# Patient Record
Sex: Female | Born: 1970 | Race: Black or African American | Hispanic: No | Marital: Married | Smoking: Current every day smoker
Health system: Southern US, Community
[De-identification: ages and names within clinical notes are randomized; demographics above are authoritative.]

## PROBLEM LIST (undated history)

## (undated) DIAGNOSIS — Z21 Asymptomatic human immunodeficiency virus [HIV] infection status: Secondary | ICD-10-CM

## (undated) DIAGNOSIS — I10 Essential (primary) hypertension: Secondary | ICD-10-CM

## (undated) DIAGNOSIS — R7989 Other specified abnormal findings of blood chemistry: Secondary | ICD-10-CM

## (undated) DIAGNOSIS — B20 Human immunodeficiency virus [HIV] disease: Secondary | ICD-10-CM

## (undated) DIAGNOSIS — K219 Gastro-esophageal reflux disease without esophagitis: Secondary | ICD-10-CM

## (undated) HISTORY — DX: Other specified abnormal findings of blood chemistry: R79.89

---

## 1998-09-09 ENCOUNTER — Emergency Department (HOSPITAL_COMMUNITY): Admission: EM | Admit: 1998-09-09 | Discharge: 1998-09-09 | Payer: Self-pay | Admitting: Emergency Medicine

## 1999-06-04 ENCOUNTER — Emergency Department (HOSPITAL_COMMUNITY): Admission: EM | Admit: 1999-06-04 | Discharge: 1999-06-04 | Payer: Self-pay | Admitting: Emergency Medicine

## 1999-12-27 ENCOUNTER — Emergency Department (HOSPITAL_COMMUNITY): Admission: EM | Admit: 1999-12-27 | Discharge: 1999-12-27 | Payer: Self-pay | Admitting: Emergency Medicine

## 2000-11-15 ENCOUNTER — Encounter: Payer: Self-pay | Admitting: Emergency Medicine

## 2000-11-15 ENCOUNTER — Emergency Department (HOSPITAL_COMMUNITY): Admission: EM | Admit: 2000-11-15 | Discharge: 2000-11-15 | Payer: Self-pay | Admitting: Emergency Medicine

## 2001-07-19 ENCOUNTER — Emergency Department (HOSPITAL_COMMUNITY): Admission: EM | Admit: 2001-07-19 | Discharge: 2001-07-19 | Payer: Self-pay

## 2001-07-20 ENCOUNTER — Ambulatory Visit (HOSPITAL_BASED_OUTPATIENT_CLINIC_OR_DEPARTMENT_OTHER): Admission: RE | Admit: 2001-07-20 | Discharge: 2001-07-20 | Payer: Self-pay | Admitting: Orthopedic Surgery

## 2005-04-17 ENCOUNTER — Ambulatory Visit: Payer: Self-pay | Admitting: Internal Medicine

## 2005-04-17 ENCOUNTER — Ambulatory Visit: Payer: Self-pay | Admitting: Infectious Diseases

## 2005-04-17 ENCOUNTER — Inpatient Hospital Stay (HOSPITAL_COMMUNITY): Admission: EM | Admit: 2005-04-17 | Discharge: 2005-04-25 | Payer: Self-pay | Admitting: Emergency Medicine

## 2005-04-18 ENCOUNTER — Encounter: Payer: Self-pay | Admitting: Cardiology

## 2005-04-18 ENCOUNTER — Ambulatory Visit: Payer: Self-pay | Admitting: Cardiology

## 2005-04-18 ENCOUNTER — Encounter (INDEPENDENT_AMBULATORY_CARE_PROVIDER_SITE_OTHER): Payer: Self-pay | Admitting: Specialist

## 2005-04-21 ENCOUNTER — Encounter (INDEPENDENT_AMBULATORY_CARE_PROVIDER_SITE_OTHER): Payer: Self-pay | Admitting: *Deleted

## 2005-04-26 ENCOUNTER — Emergency Department (HOSPITAL_COMMUNITY): Admission: EM | Admit: 2005-04-26 | Discharge: 2005-04-26 | Payer: Self-pay | Admitting: Emergency Medicine

## 2005-04-27 ENCOUNTER — Emergency Department (HOSPITAL_COMMUNITY): Admission: EM | Admit: 2005-04-27 | Discharge: 2005-04-27 | Payer: Self-pay | Admitting: Emergency Medicine

## 2005-04-28 ENCOUNTER — Encounter (HOSPITAL_COMMUNITY): Admission: RE | Admit: 2005-04-28 | Discharge: 2005-05-28 | Payer: Self-pay | Admitting: Internal Medicine

## 2005-05-03 ENCOUNTER — Emergency Department (HOSPITAL_COMMUNITY): Admission: EM | Admit: 2005-05-03 | Discharge: 2005-05-03 | Payer: Self-pay | Admitting: Emergency Medicine

## 2005-05-04 ENCOUNTER — Emergency Department (HOSPITAL_COMMUNITY): Admission: EM | Admit: 2005-05-04 | Discharge: 2005-05-04 | Payer: Self-pay | Admitting: Emergency Medicine

## 2005-05-08 ENCOUNTER — Ambulatory Visit: Payer: Self-pay | Admitting: Internal Medicine

## 2005-05-10 ENCOUNTER — Emergency Department (HOSPITAL_COMMUNITY): Admission: EM | Admit: 2005-05-10 | Discharge: 2005-05-10 | Payer: Self-pay | Admitting: Emergency Medicine

## 2005-05-11 ENCOUNTER — Emergency Department (HOSPITAL_COMMUNITY): Admission: EM | Admit: 2005-05-11 | Discharge: 2005-05-12 | Payer: Self-pay | Admitting: Emergency Medicine

## 2005-05-17 ENCOUNTER — Emergency Department (HOSPITAL_COMMUNITY): Admission: EM | Admit: 2005-05-17 | Discharge: 2005-05-17 | Payer: Self-pay | Admitting: Emergency Medicine

## 2005-05-18 ENCOUNTER — Emergency Department (HOSPITAL_COMMUNITY): Admission: EM | Admit: 2005-05-18 | Discharge: 2005-05-18 | Payer: Self-pay | Admitting: Emergency Medicine

## 2005-05-24 ENCOUNTER — Emergency Department (HOSPITAL_COMMUNITY): Admission: EM | Admit: 2005-05-24 | Discharge: 2005-05-24 | Payer: Self-pay | Admitting: Emergency Medicine

## 2005-05-29 ENCOUNTER — Emergency Department (HOSPITAL_COMMUNITY): Admission: EM | Admit: 2005-05-29 | Discharge: 2005-05-29 | Payer: Self-pay | Admitting: Emergency Medicine

## 2006-04-19 ENCOUNTER — Emergency Department (HOSPITAL_COMMUNITY): Admission: EM | Admit: 2006-04-19 | Discharge: 2006-04-19 | Payer: Self-pay | Admitting: Emergency Medicine

## 2006-07-08 ENCOUNTER — Emergency Department (HOSPITAL_COMMUNITY): Admission: EM | Admit: 2006-07-08 | Discharge: 2006-07-08 | Payer: Self-pay | Admitting: Emergency Medicine

## 2007-07-29 ENCOUNTER — Inpatient Hospital Stay (HOSPITAL_COMMUNITY): Admission: EM | Admit: 2007-07-29 | Discharge: 2007-08-03 | Payer: Self-pay | Admitting: Emergency Medicine

## 2007-08-02 ENCOUNTER — Ambulatory Visit: Payer: Self-pay | Admitting: Physical Medicine & Rehabilitation

## 2007-11-12 ENCOUNTER — Emergency Department (HOSPITAL_COMMUNITY): Admission: EM | Admit: 2007-11-12 | Discharge: 2007-11-12 | Payer: Self-pay | Admitting: Emergency Medicine

## 2008-12-11 ENCOUNTER — Emergency Department (HOSPITAL_COMMUNITY): Admission: EM | Admit: 2008-12-11 | Discharge: 2008-12-11 | Payer: Self-pay | Admitting: Emergency Medicine

## 2009-07-09 ENCOUNTER — Emergency Department (HOSPITAL_COMMUNITY): Admission: EM | Admit: 2009-07-09 | Discharge: 2009-07-09 | Payer: Self-pay | Admitting: Emergency Medicine

## 2009-07-18 ENCOUNTER — Emergency Department (HOSPITAL_COMMUNITY): Admission: EM | Admit: 2009-07-18 | Discharge: 2009-07-18 | Payer: Self-pay | Admitting: Emergency Medicine

## 2010-07-02 ENCOUNTER — Emergency Department (HOSPITAL_COMMUNITY): Admission: EM | Admit: 2010-07-02 | Discharge: 2010-07-02 | Payer: Self-pay | Admitting: Emergency Medicine

## 2011-01-24 LAB — POCT I-STAT, CHEM 8
BUN: 13 mg/dL (ref 6–23)
Creatinine, Ser: 1 mg/dL (ref 0.4–1.2)
Glucose, Bld: 97 mg/dL (ref 70–99)
Hemoglobin: 15 g/dL (ref 12.0–15.0)
Potassium: 4.1 mEq/L (ref 3.5–5.1)
TCO2: 25 mmol/L (ref 0–100)
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
## 2011-03-07 NOTE — Op Note (Signed)
[place] [place] [facility]  Patient:    Michelle Waters, Michelle Waters Visit Number: [id number] MRN: [id number]          Service Type: DSU Location: [facility] Attending Physician:  Ronne Binning Dictated by:   Nicki Reaper, M.D. Proc. Date: 07/20/01 Admit Date:  07/20/2001                             Operative Report  PREOPERATIVE DIAGNOSIS:  Laceration, left forearm.  POSTOPERATIVE DIAGNOSIS:  Laceration, left forearm.  OPERATION:  Repair of palmaris longus tendon, flexor carpi ulnaris muscle, superficialis muscle, left arm.  SURGEON:  Nicki Reaper, M.D.  ASSISTANT:  Joaquin Courts, R.N.  ANESTHESIA:  General.  ANESTHESIA:  Janetta Hora. Gelene Mink, M.D.  HISTORY:  The patient is a 40 year old female who suffered a laceration over the midportion of her left forearm.  She complains of pain with flexion of her wrist, numbness and tingling.  DESCRIPTION OF PROCEDURE:  The patient was brought to the operating room where a general anesthetic was carried out without difficulty.  She was prepped and draped using Betadine scrubbing solution with the left arm free.  The limb was exsanguinated with an Esmarch bandage.  Tourniquet placed high on the arm was inflated to 250 mmHg.  The wound was opened.  The laceration to the palmaris longus was immediately apparent.  Laceration into the flexor carpi ulnaris and superficialis was identified.  The depths of the wound were explored.  These were found only to go into the muscle bellies without tendinous involvement. The muscle bellies were repaired with figure-of-eight 4-0 Vicryl sutures.  The tendon was repaired with figure-of-eight 4-0 Mersilene sutures.  The fascia was closed with a running 4-0 Vicryl.  Subcutaneous tissue was closed after irrigation with 4-0 Vicryl and the skin with a subcuticular 4-0 Monocryl suture.  Steri-Strips were applied and sterile compressive dressing and splint were applied.  Patient tolerated the  procedure well and was taken to the recovery room for observation in satisfactory condition.    She is discharged home to return to the [facility] in one week on Vicodin.  She was given vancomycin in the operating room. Dictated by:   Nicki Reaper, M.D. Attending Physician:  Ronne Binning DD:  07/20/01 TD:  07/20/01 Job: [id number]

## 2011-03-07 NOTE — Discharge Summary (Signed)
NAMEJACOLE, Michelle Waters NO.:  [id number]   MEDICAL RECORD NO.:  [id number]          PATIENT TYPE:  INP   LOCATION:  5522                         FACILITY:  [facility]   PHYSICIAN:  Madaline Guthrie, M.D.    DATE OF BIRTH:  [birth year]   DATE OF ADMISSION:  04/17/2005  DATE OF DISCHARGE:  04/25/2005                                 DISCHARGE SUMMARY   RESIDENT:  Chapman Fitch, M.D.   DISCHARGE DIAGNOSES:  1.  Osteomyelitis of Right clavicle.  2.  Latent syphilis, diagnosed by a positive RPR and positive TPPA.  3.  Normocytic anemia.  4.  History of poly-substance abuse.   DISCHARGE MEDICATIONS:  1.  Ceftriaxone 2 g IV daily x4 weeks.  2.  Tylenol 325 mg q.4-6h. p.r.n. pain.   DISPOSITION/FOLLOWUP:  1.  Michelle Waters was discharged home from the hospital following evaluation and      treatment of osteomyelitis of her right medial clavicle.  She was      discharged with a percutaneous indwelling central catheter line and      scheduled to return to the hospital, outpatient clinic or the emergency      room daily for IV antibiotic administration for four weeks.  2.  She will follow up in the outpatient clinic with Dr. Chapman Fitch on      May 08, 2005, at 2:30 p.m.   DIET:  No restrictions.   ACTIVITY:  No restrictions.   PROCEDURE:  1.  On April 17, 2005, a CT of the clavicle showed destructive lesion of the      inferior clavicle.  2.  On April 18, 2005, an echocardiogram showed no abnormalities with an      ejection fraction of 65%.  3.  On April 21, 2005, a CT-guided fine needle aspiration of the right      clavicle showed an acute inflammation and no malignant cells.  4.  On April 25, 2005, a percutaneous indwelling central catheter line was      placed for outpatient antibiotic administration.   CONSULTATIONS:  1.  Orthopedics.  2.  Infectious disease with Dr. Fransisco Hertz.   HISTORY OF PRESENT ILLNESS:  Michelle Waters is a 40 year old female with a  history of  poly-substance abuse, who presents to the emergency room  complaining of increasing pain on her right clavicle for the previous two  weeks.  She reported no history of trauma or IV drug use.  Her physical  examination was remarkable for the right clavicle which was warm, swollen,  erythematous and tender to palpation medially.  Please refer to the H&P in  the chart for the full admitting details.   ADMISSION LABORATORY DATA:  CBC showed a WBC of 7.8, hemoglobin 11.8,  hematocrit 35.3, platelets 361 with an ANC of 6.3 and MCV of 84.5.  BMET:  Sodium 134, potassium 3.4, chloride 102, bicarbonate 23, BUN 5, creatinine  0.8, glucose 159.  Urine drug screen was positive for benzodiazepine,  cocaine and opiate.   HOSPITAL COURSE:  #1 - RIGHT CLAVICULAR OSTEOARTHRITIS:  Michelle Waters was  admitted to evaluate her right clavicle lesion due to infection, versus  malignancy, versus trauma, versus gout or rheumatoid arthritis.  The  location and symptoms were not consistent with gout or rheumatoid arthritis,  and this was quickly ruled out.  She reported no history of trauma.  Her  admission WBC was unremarkable at 7.8.  We drew blood cultures and then  began empiric antibiotic therapy with vancomycin and ciprofloxacin.  Orthopedics was consulted and performed a bone biopsy which was found to  contain no malignant cells, and to have acute inflammation which could be  consistent with osteomyelitis.  AFB, yeast, aerobic, anaerobic and fungal  stains and cultures have been negative to date.  AFB,  yeast and fungal  cultures remain pending at the time of this dictation.  Because Michelle Waters  had a reactive RPR and positive TPPA, a treponemal stain was added to the  biopsy, but was also pending at the time of this dictation.  Michelle Waters  reports a PENICILLIN allergy with urticarial reaction, thus we consulted the  infectious disease team for recommendations regarding treatment  understanding the organism,  negative biopsy and blood-positive RPR/TPPA.  They recommended ceftriaxone IV for broad coverage for multiple causes of  osteomyelitis including treponema.  Michelle Waters was to go home with a  percutaneous indwelling central catheter line for IV ceftriaxone.  She was  arranged to come to the outpatient clinic during the week-days or to the  emergency room on the weekends to receive her medications.  They will also  perform the percutaneous indwelling central catheter line care.  The  infectious disease team also recommended a PPD to evaluate for another  possible source of osteomyelitis.  This was placed on April 24, 2005, and will  be read on April 26, 2005, when Michelle Waters comes for her IV infusion of  antibiotics.  Michelle Waters remained afebrile with a normal white blood cell  count throughout this admission.   #2 - CHLAMYDIA:  During the workup for causes of infectious osteomyelitis,  Michelle Waters was found to be Chlamydia-positive and GC negative per a vaginal  swab.  She was subsequently treated with one of dose erythromycin, 1 gm, and  one dose of ceftriaxone 125 mg IV.   #3 - LATENT SYPHILIS:  During the workup for causes of infection  osteomyelitis, Michelle Waters was found to be RPR reactive with a positive TPPA.  She is allergic to PENICILLIN.  Ceftriaxone was recommended for the  osteomyelitis and was chosen because of broad coverage which extended to the  treponema.   #4 - NORMOCYTIC ANEMIA:  Routine labs revealed a slight normocytic anemia of  10.2, which was evaluated because of a questionable history of sickle cell  disease.  Iron studies and electrophoresis were normal.  This should be  followed up with her primary care physician.   DISCHARGE LABORATORY DATA:  At discharge Michelle Waters's labs were as follows:  CBC with white blood cell count of 10.3, hemoglobin 10.2, hematocrit 31, platelets 352.  BMET showed a sodium of  137, potassium 3.8, chloride 105,  bicarbonate 26, BUN 8, creatinine  0.8, glucose 93.      Chapman Fitch, MD      Madaline Guthrie, M.D.  Electronically Signed    IO/MEDQ  D:  04/30/2005  T:  04/30/2005  Job:  [id number]

## 2011-07-11 LAB — URINALYSIS, ROUTINE W REFLEX MICROSCOPIC
Hgb urine dipstick: NEGATIVE
Ketones, ur: 40 — AB
Protein, ur: NEGATIVE
Specific Gravity, Urine: 1.025
pH: 6

## 2011-07-11 LAB — RAPID URINE DRUG SCREEN, HOSP PERFORMED
Amphetamines: NOT DETECTED
Barbiturates: NOT DETECTED
Cocaine: POSITIVE — AB
Opiates: NOT DETECTED
Tetrahydrocannabinol: NOT DETECTED

## 2011-07-11 LAB — POCT I-STAT CREATININE
Creatinine, Ser: 0.9
Operator id: 288831

## 2011-07-11 LAB — I-STAT 8, (EC8 V) (CONVERTED LAB)
Acid-Base Excess: 1
Bicarbonate: 27.2 — ABNORMAL HIGH
Glucose, Bld: 101 — ABNORMAL HIGH
TCO2: 29
pCO2, Ven: 46.3
pH, Ven: 7.376 — ABNORMAL HIGH

## 2011-07-11 LAB — DIFFERENTIAL
Eosinophils Relative: 0
Lymphocytes Relative: 15
Lymphs Abs: 1.4
Monocytes Absolute: 0.3
Neutro Abs: 7.5
Neutrophils Relative %: 81 — ABNORMAL HIGH

## 2011-07-11 LAB — CBC
HCT: 39.7
RDW: 14.1

## 2011-07-11 LAB — POCT PREGNANCY, URINE: Preg Test, Ur: NEGATIVE

## 2011-07-11 LAB — URINE MICROSCOPIC-ADD ON

## 2011-07-11 LAB — ETHANOL: Alcohol, Ethyl (B): <5

## 2011-07-31 LAB — BASIC METABOLIC PANEL
CO2: 27
CO2: 28
Calcium: 9.2
GFR calc Af Amer: >60
GFR calc non Af Amer: >60
Glucose, Bld: 103 — ABNORMAL HIGH
Potassium: 3.6
Potassium: 4.3
Sodium: 137
Sodium: 138

## 2011-07-31 LAB — I-STAT 8, (EC8 V) (CONVERTED LAB)
Acid-Base Excess: 2
BUN: 5 — ABNORMAL LOW
Chloride: 103
Glucose, Bld: 145 — ABNORMAL HIGH
Potassium: 3.9
pCO2, Ven: 49
pH, Ven: 7.369 — ABNORMAL HIGH

## 2011-07-31 LAB — POCT I-STAT CREATININE
Creatinine, Ser: 0.9
Operator id: 198171

## 2011-07-31 LAB — CBC
HCT: 33.9 — ABNORMAL LOW
Hemoglobin: 11.3 — ABNORMAL LOW
MCHC: 33.3
RBC: 3.76 — ABNORMAL LOW

## 2011-07-31 LAB — HEPATITIS PANEL, ACUTE: Hep A IgM: NEGATIVE

## 2011-07-31 LAB — HEMOGLOBIN A1C: Mean Plasma Glucose: 119

## 2013-02-21 ENCOUNTER — Emergency Department (HOSPITAL_COMMUNITY)
Admission: EM | Admit: 2013-02-21 | Discharge: 2013-02-21 | Disposition: A | Discharge status: Home / Self Care | Payer: PRIVATE HEALTH INSURANCE | Attending: Emergency Medicine | Admitting: Emergency Medicine

## 2013-02-21 ENCOUNTER — Encounter (HOSPITAL_COMMUNITY): Payer: Self-pay | Admitting: Cardiology

## 2013-02-21 DIAGNOSIS — F17200 Nicotine dependence, unspecified, uncomplicated: Secondary | ICD-10-CM | POA: Insufficient documentation

## 2013-02-21 DIAGNOSIS — K089 Disorder of teeth and supporting structures, unspecified: Secondary | ICD-10-CM | POA: Insufficient documentation

## 2013-02-21 DIAGNOSIS — K0889 Other specified disorders of teeth and supporting structures: Secondary | ICD-10-CM

## 2013-02-21 MED ORDER — CLINDAMYCIN HCL 150 MG PO CAPS: 150.0000 mg | ORAL_CAPSULE | Freq: Four times a day (QID) | ORAL | Status: DC

## 2013-02-21 MED ORDER — HYDROCODONE-ACETAMINOPHEN 5-325 MG PO TABS
1.0000 | ORAL_TABLET | Freq: Once | ORAL | Status: AC
  Administered 2013-02-21: 1 via ORAL
  Filled 2013-02-21: qty 1

## 2013-02-21 MED ORDER — CLINDAMYCIN HCL 150 MG PO CAPS
300.0000 mg | ORAL_CAPSULE | Freq: Once | ORAL | Status: AC
  Administered 2013-02-21: 300 mg via ORAL
  Filled 2013-02-21: qty 2

## 2013-02-21 MED ORDER — HYDROCODONE-ACETAMINOPHEN 5-325 MG PO TABS: 2.0000 | ORAL_TABLET | ORAL | Status: DC | PRN

## 2013-02-21 NOTE — ED Notes (Signed)
Pt reports dental pain over the past 2 weeks. States she has been taking OTC medication without relief.

## 2013-02-21 NOTE — ED Provider Notes (Signed)
History     CSN: [id number]  Arrival date & time 02/21/13  1113   First MD Initiated Contact with Patient 02/21/13 1253      Chief Complaint  Patient presents with  . Dental Pain    (Consider location/radiation/quality/duration/timing/severity/associated sxs/prior treatment) HPI  42 year old female presents complaining of dental pain. Patient reports for the past 2 weeks she has been having intermittent pain to the right upper jaw. Pain is described as a sharp and throbbing sensation, radiates up towards the eye and her right side of face. Pain is worsened with chewing, cold hot water, or cold air. She has been taking over-the-counter medication including Tylenol, Motrin, and the with minimal relief. She denies any fever, chills, neck pain, or rash. No recent trauma. She has not follow up with dentist. She is a smoker.  History reviewed. No pertinent past medical history.  History reviewed. No pertinent past surgical history.  History reviewed. No pertinent family history.  History  Substance Use Topics  . Smoking status: Current Every Day Smoker  . Smokeless tobacco: Not on file  . Alcohol Use: Yes    OB History   Grav Para Term Preterm Abortions TAB SAB Ect Mult Living                  Review of Systems  Constitutional: Negative for fever.  HENT: Positive for dental problem. Negative for ear pain, sore throat and trouble swallowing.   Skin: Negative for rash.  Neurological: Positive for headaches.    Allergies  Ampicillin and Penicillins  Home Medications   Current Outpatient Rx  Name  Route  Sig  Dispense  Refill  . ibuprofen (ADVIL,MOTRIN) 200 MG tablet   Oral   Take 200-400 mg by mouth every 6 (six) hours as needed for pain or headache.           BP 139/90  Pulse 80  Temp(Src) 98.9 F (37.2 C) (Oral)  Resp 18  SpO2 100%  Physical Exam  Nursing note and vitals reviewed. Constitutional: She appears well-developed and well-nourished. She appears  distressed (uncomfortable appearing, holding the right side of her cheek.).  HENT:  Head: Normocephalic and atraumatic.  Mouth: Poor dentition. Tenderness to right second upper premolar, with dental decay. Tenderness to surrounding gumline without obvious abscess amenable to drainage. No trismus.  Eyes: Conjunctivae are normal.  Neck: Neck supple.  Lymphadenopathy:    She has no cervical adenopathy.  Neurological: She is alert.  Skin: Skin is warm. No rash noted.    ED Course  Procedures (including critical care time)  1:42 PM Patient presents with dental pain. Pain medication and antibiotic given.  dentist referral given.  Labs Reviewed - No data to display No results found.   1. Pain, dental       MDM  BP 139/90  Pulse 80  Temp(Src) 98.9 F (37.2 C) (Oral)  Resp 18  SpO2 100%         Fayrene Helper, PA-C 02/21/13 1344

## 2013-02-21 NOTE — ED Provider Notes (Signed)
Medical screening examination/treatment/procedure(s) were performed by non-physician practitioner and as supervising physician I was immediately available for consultation/collaboration. Ervine Witucki, MD, FACEP   Monasia Lair L Jamesa Tedrick, MD 02/21/13 1635 

## 2013-11-05 ENCOUNTER — Emergency Department (HOSPITAL_COMMUNITY)
Admission: EM | Admit: 2013-11-05 | Discharge: 2013-11-05 | Disposition: A | Discharge status: Home / Self Care | Payer: PRIVATE HEALTH INSURANCE | Attending: Emergency Medicine | Admitting: Emergency Medicine

## 2013-11-05 ENCOUNTER — Encounter (HOSPITAL_COMMUNITY): Payer: Self-pay | Admitting: Emergency Medicine

## 2013-11-05 DIAGNOSIS — J029 Acute pharyngitis, unspecified: Secondary | ICD-10-CM | POA: Insufficient documentation

## 2013-11-05 DIAGNOSIS — H9209 Otalgia, unspecified ear: Secondary | ICD-10-CM | POA: Insufficient documentation

## 2013-11-05 DIAGNOSIS — F17200 Nicotine dependence, unspecified, uncomplicated: Secondary | ICD-10-CM | POA: Insufficient documentation

## 2013-11-05 DIAGNOSIS — Z880 Allergy status to penicillin: Secondary | ICD-10-CM | POA: Insufficient documentation

## 2013-11-05 DIAGNOSIS — Z792 Long term (current) use of antibiotics: Secondary | ICD-10-CM | POA: Insufficient documentation

## 2013-11-05 LAB — RAPID STREP SCREEN (MED CTR MEBANE ONLY): STREPTOCOCCUS, GROUP A SCREEN (DIRECT): NEGATIVE

## 2013-11-05 MED ORDER — DEXAMETHASONE SODIUM PHOSPHATE 10 MG/ML IJ SOLN
10.0000 mg | Freq: Once | INTRAMUSCULAR | Status: AC
  Administered 2013-11-05: 10 mg via INTRAMUSCULAR
  Filled 2013-11-05: qty 1

## 2013-11-05 MED ORDER — CARBAMIDE PEROXIDE 6.5 % OT SOLN: 5.0000 [drp] | Freq: Two times a day (BID) | OTIC | Status: DC

## 2013-11-05 MED ORDER — PSEUDOEPHEDRINE HCL 30 MG PO TABS: 30.0000 mg | ORAL_TABLET | ORAL | Status: DC | PRN

## 2013-11-05 NOTE — ED Provider Notes (Signed)
Medical screening examination/treatment/procedure(s) were performed by non-physician practitioner and as supervising physician I was immediately available for consultation/collaboration.  EKG Interpretation   None       Rolland Porter, MD, Abram Sander   Janice Norrie, MD 11/05/13 1330

## 2013-11-05 NOTE — ED Provider Notes (Signed)
CSN: [id number]     Arrival date & time 11/05/13  1129 History  This chart was scribed for non-physician practitioner working with Janice Norrie, MD by Stacy Gardner, ED scribe. This patient was seen in room TR08C/TR08C and the patient's care was started at 11:45 AM.   None    Chief Complaint  Patient presents with  . Otalgia  . Sore Throat   (Consider location/radiation/quality/duration/timing/severity/associated sxs/prior Treatment) Patient is a 43 y.o. female presenting with pharyngitis. The history is provided by the patient and medical records. No language interpreter was used.  Sore Throat   HPI Comments: Michelle Waters is a 43 y.o. female who presents to the Emergency Department complaining of reoccuring R otalgia and sore throat that returned two days ago.  Pt has the associated symptoms of right sinus pain/pressure, tonsil irritation, chills, fever, cough (productive), congestion, and sore throat. She was told she needs a tonsillectomy. Pt states finding mild relief with eating ice cream. She currently smokes tobacco everyday. Pt has allergies to ampicillins and penicillins. She does not have any prior medical complications. History reviewed. No pertinent past medical history. History reviewed. No pertinent past surgical history. No family history on file. History  Substance Use Topics  . Smoking status: Current Every Day Smoker  . Smokeless tobacco: Not on file  . Alcohol Use: Yes   OB History   Grav Para Term Preterm Abortions TAB SAB Ect Mult Living                 Review of Systems  Constitutional: Positive for fever and chills.  HENT: Positive for congestion, ear pain, sinus pressure and sore throat.   Respiratory: Positive for cough.   All other systems reviewed and are negative.    Allergies  Ampicillin and Penicillins  Home Medications   Current Outpatient Rx  Name  Route  Sig  Dispense  Refill  . clindamycin (CLEOCIN) 150 MG capsule   Oral   Take 1  capsule (150 mg total) by mouth every 6 (six) hours.   28 capsule   0   . HYDROcodone-acetaminophen (NORCO/VICODIN) 5-325 MG per tablet   Oral   Take 2 tablets by mouth every 4 (four) hours as needed for pain.   10 tablet   0   . ibuprofen (ADVIL,MOTRIN) 200 MG tablet   Oral   Take 200-400 mg by mouth every 6 (six) hours as needed for pain or headache.          BP 146/77  Pulse 67  Temp(Src) 98.3 F (36.8 C) (Oral)  Resp 18  Wt 125 lb 14.4 oz (57.108 kg)  SpO2 100% Physical Exam  Nursing note and vitals reviewed. Constitutional: She is oriented to person, place, and time. She appears well-developed and well-nourished. No distress.  HENT:  Head: Normocephalic and atraumatic.  Left Ear: External ear normal.  Nose: Nose normal.  Mouth/Throat: Posterior oropharyngeal erythema present.  No sinus tenderness to palpation - right ear with cerumen impaction, mild posterior pharyngeal erythema  Eyes: Conjunctivae are normal. Pupils are equal, round, and reactive to light. No scleral icterus.  Neck: Neck supple.  Cardiovascular: Normal rate, regular rhythm, normal heart sounds and intact distal pulses.   No murmur heard. Pulmonary/Chest: Effort normal and breath sounds normal. No stridor. No respiratory distress. She has no wheezes. She has no rales.  Abdominal: Soft. Bowel sounds are normal. She exhibits no distension. There is no tenderness.  Musculoskeletal: Normal range of motion.  Neurological:  She is alert and oriented to person, place, and time.  Skin: Skin is warm and dry. No rash noted.  Psychiatric: She has a normal mood and affect. Her behavior is normal.    ED Course  Procedures (including critical care time) DIAGNOSTIC STUDIES: Oxygen Saturation is 100% on room air, normal by my interpretation.    COORDINATION OF CARE:  11:51 AM Discussed course of care with pt which includes rapid strep test. Pt understands and agrees.   Labs Review Labs Reviewed - No data to  display Imaging Review No results found.  EKG Interpretation   None      Results for orders placed during the hospital encounter of 11/05/13  RAPID STREP SCREEN      Result Value Range   Streptococcus, Group A Screen (Direct) NEGATIVE  NEGATIVE   No results found.  Medications  dexamethasone (DECADRON) injection 10 mg (10 mg Intramuscular Given 11/05/13 1159)     MDM  Viral pharyngitis  Patient here with right ear pain (cerumen impaction) and right sinus pain and pressure - do not feel antibiotics are needed at this time.  No evidence of soft tissue infection, ludwigs angina.  I personally performed the services described in this documentation, which was scribed in my presence. The recorded information has been reviewed and is accurate.      Idalia Needle Joelyn Oms, PA-C 11/05/13 1316

## 2013-11-05 NOTE — Discharge Instructions (Signed)
Antibiotic Nonuse  Your caregiver felt that the infection or problem was not one that would be helped with an antibiotic. Infections may be caused by viruses or bacteria. Only a caregiver can tell which one of these is the likely cause of an illness. A cold is the most common cause of infection in both adults and children. A cold is a virus. Antibiotic treatment will have no effect on a viral infection. Viruses can lead to many lost days of work caring for sick children and many missed days of school. Children may catch as many as 10 "colds" or "flus" per year during which they can be tearful, cranky, and uncomfortable. The goal of treating a virus is aimed at keeping the ill person comfortable. Antibiotics are medications used to help the body fight bacterial infections. There are relatively few types of bacteria that cause infections but there are hundreds of viruses. While both viruses and bacteria cause infection they are very different types of germs. A viral infection will typically go away by itself within 7 to 10 days. Bacterial infections may spread or get worse without antibiotic treatment. Examples of bacterial infections are:  Sore throats (like strep throat or tonsillitis).  Infection in the lung (pneumonia).  Ear and skin infections. Examples of viral infections are:  Colds or flus.  Most coughs and bronchitis.  Sore throats not caused by Strep.  Runny noses. It is often best not to take an antibiotic when a viral infection is the cause of the problem. Antibiotics can kill off the helpful bacteria that we have inside our body and allow harmful bacteria to start growing. Antibiotics can cause side effects such as allergies, nausea, and diarrhea without helping to improve the symptoms of the viral infection. Additionally, repeated uses of antibiotics can cause bacteria inside of our body to become resistant. That resistance can be passed onto harmful bacterial. The next time you have  an infection it may be harder to treat if antibiotics are used when they are not needed. Not treating with antibiotics allows our own immune system to develop and take care of infections more efficiently. Also, antibiotics will work better for [place] when they are prescribed for bacterial infections. Treatments for a child that is ill may include:  Give extra fluids throughout the day to stay hydrated.  Get plenty of rest.  Only give your child over-the-counter or prescription medicines for pain, discomfort, or fever as directed by your caregiver.  The use of a cool mist humidifier may help stuffy noses.  Cold medications if suggested by your caregiver. Your caregiver may decide to start you on an antibiotic if:  The problem you were seen for today continues for a longer length of time than expected.  You develop a secondary bacterial infection. SEEK MEDICAL CARE IF:  Fever lasts longer than 5 days.  Symptoms continue to get worse after 5 to 7 days or become severe.  Difficulty in breathing develops.  Signs of dehydration develop (poor drinking, rare urinating, dark colored urine).  Changes in behavior or worsening tiredness (listlessness or lethargy). Document Released: 12/15/2001 Document Revised: 12/29/2011 Document Reviewed: 06/13/2009 [facility] Patient Information 2014 [place], [place].  Pharyngitis Pharyngitis is redness, pain, and swelling (inflammation) of your pharynx.  CAUSES  Pharyngitis is usually caused by infection. Most of the time, these infections are from viruses (viral) and are part of a cold. However, sometimes pharyngitis is caused by bacteria (bacterial). Pharyngitis can also be caused by allergies. Viral pharyngitis may be  spread from person to person by coughing, sneezing, and personal items or utensils (cups, forks, spoons, toothbrushes). Bacterial pharyngitis may be spread from person to person by more intimate contact, such as kissing.  SIGNS AND SYMPTOMS    Symptoms of pharyngitis include:   Sore throat.   Tiredness (fatigue).   Low-grade fever.   Headache.  Joint pain and muscle aches.  Skin rashes.  Swollen lymph nodes.  Plaque-like film on throat or tonsils (often seen with bacterial pharyngitis). DIAGNOSIS  Your health care provider will ask you questions about your illness and your symptoms. Your medical history, along with a physical exam, is often all that is needed to diagnose pharyngitis. Sometimes, a rapid strep test is done. Other lab tests may also be done, depending on the suspected cause.  TREATMENT  Viral pharyngitis will usually get better in 3 4 days without the use of medicine. Bacterial pharyngitis is treated with medicines that kill germs (antibiotics).  HOME CARE INSTRUCTIONS   Drink enough water and fluids to keep your urine clear or pale yellow.   Only take over-the-counter or prescription medicines as directed by your health care provider:   If you are prescribed antibiotics, make sure you finish them even if you start to feel better.   Do not take aspirin.   Get lots of rest.   Gargle with 8 oz of salt water ( tsp of salt per 1 qt of water) as often as every 1 2 hours to soothe your throat.   Throat lozenges (if you are not at risk for choking) or sprays may be used to soothe your throat. SEEK MEDICAL CARE IF:   You have large, tender lumps in your neck.  You have a rash.  You cough up green, yellow-brown, or bloody spit. SEEK IMMEDIATE MEDICAL CARE IF:   Your neck becomes stiff.  You drool or are unable to swallow liquids.  You vomit or are unable to keep medicines or liquids down.  You have severe pain that does not go away with the use of recommended medicines.  You have trouble breathing (not caused by a stuffy nose). MAKE SURE YOU:   Understand these instructions.  Will watch your condition.  Will get help right away if you are not doing well or get worse. Document  Released: 10/06/2005 Document Revised: 07/27/2013 Document Reviewed: 06/13/2013 [facility] Patient Information 2014 [place].  Salt Water Gargle This solution will help make your mouth and throat feel better. HOME CARE INSTRUCTIONS   Mix 1 teaspoon of salt in 8 ounces of warm water.  Gargle with this solution as much or often as you need or as directed. Swish and gargle gently if you have any sores or wounds in your mouth.  Do not swallow this mixture. Document Released: 07/10/2004 Document Revised: 12/29/2011 Document Reviewed: 12/01/2008 [facility] Patient Information 2014 [place].

## 2013-11-05 NOTE — ED Notes (Signed)
Pt states that on Thursday she began having R ear pain, R sinus pain/pressure, and sore throat.

## 2013-11-08 LAB — CULTURE, GROUP A STREP

## 2014-09-04 ENCOUNTER — Encounter (HOSPITAL_COMMUNITY): Payer: Self-pay | Admitting: Emergency Medicine

## 2014-09-04 ENCOUNTER — Emergency Department (HOSPITAL_COMMUNITY): Payer: PRIVATE HEALTH INSURANCE

## 2014-09-04 ENCOUNTER — Emergency Department (HOSPITAL_COMMUNITY)
Admission: EM | Admit: 2014-09-04 | Discharge: 2014-09-04 | Disposition: A | Discharge status: Home / Self Care | Payer: PRIVATE HEALTH INSURANCE | Attending: Emergency Medicine | Admitting: Emergency Medicine

## 2014-09-04 DIAGNOSIS — K047 Periapical abscess without sinus: Secondary | ICD-10-CM | POA: Diagnosis not present

## 2014-09-04 DIAGNOSIS — K029 Dental caries, unspecified: Secondary | ICD-10-CM | POA: Diagnosis not present

## 2014-09-04 DIAGNOSIS — K088 Other specified disorders of teeth and supporting structures: Secondary | ICD-10-CM | POA: Diagnosis present

## 2014-09-04 DIAGNOSIS — Z79899 Other long term (current) drug therapy: Secondary | ICD-10-CM | POA: Insufficient documentation

## 2014-09-04 DIAGNOSIS — Z720 Tobacco use: Secondary | ICD-10-CM | POA: Diagnosis not present

## 2014-09-04 DIAGNOSIS — Z880 Allergy status to penicillin: Secondary | ICD-10-CM | POA: Insufficient documentation

## 2014-09-04 HISTORY — DX: Gastro-esophageal reflux disease without esophagitis: K21.9

## 2014-09-04 LAB — I-STAT CHEM 8, ED
BUN: 8 mg/dL (ref 6–23)
CALCIUM ION: 1.25 mmol/L — AB (ref 1.12–1.23)
CHLORIDE: 106 meq/L (ref 96–112)
CREATININE: 1 mg/dL (ref 0.50–1.10)
GLUCOSE: 92 mg/dL (ref 70–99)
HCT: 43 % (ref 36.0–46.0)
Hemoglobin: 14.6 g/dL (ref 12.0–15.0)
Potassium: 4.2 mEq/L (ref 3.7–5.3)
Sodium: 140 mEq/L (ref 137–147)
TCO2: 22 mmol/L (ref 0–100)

## 2014-09-04 LAB — CBC
HEMATOCRIT: 38.8 % (ref 36.0–46.0)
Hemoglobin: 12.7 g/dL (ref 12.0–15.0)
MCH: 28.9 pg (ref 26.0–34.0)
MCHC: 32.7 g/dL (ref 30.0–36.0)
MCV: 88.2 fL (ref 78.0–100.0)
PLATELETS: 220 10*3/uL (ref 150–400)
RBC: 4.4 MIL/uL (ref 3.87–5.11)
RDW: 13 % (ref 11.5–15.5)
WBC: 8.9 10*3/uL (ref 4.0–10.5)

## 2014-09-04 MED ORDER — CLINDAMYCIN HCL 150 MG PO CAPS
450.0000 mg | ORAL_CAPSULE | Freq: Once | ORAL | Status: AC
  Administered 2014-09-04: 450 mg via ORAL
  Filled 2014-09-04: qty 3

## 2014-09-04 MED ORDER — CLINDAMYCIN HCL 150 MG PO CAPS: 450.0000 mg | ORAL_CAPSULE | Freq: Three times a day (TID) | ORAL | Status: DC

## 2014-09-04 MED ORDER — OXYCODONE-ACETAMINOPHEN 5-325 MG PO TABS
2.0000 | ORAL_TABLET | Freq: Once | ORAL | Status: AC
  Administered 2014-09-04: 2 via ORAL
  Filled 2014-09-04: qty 2

## 2014-09-04 MED ORDER — ONDANSETRON 4 MG PO TBDP
4.0000 mg | ORAL_TABLET | Freq: Once | ORAL | Status: AC
  Administered 2014-09-04: 4 mg via ORAL
  Filled 2014-09-04: qty 1

## 2014-09-04 MED ORDER — OXYCODONE-ACETAMINOPHEN 10-325 MG PO TABS: 1.0000 | ORAL_TABLET | ORAL | Status: DC | PRN

## 2014-09-04 MED ORDER — IOHEXOL 300 MG/ML  SOLN
75.0000 mL | Freq: Once | INTRAMUSCULAR | Status: AC | PRN
  Administered 2014-09-04: 75 mL via INTRAVENOUS

## 2014-09-04 NOTE — ED Notes (Signed)
Patient states she has been taking OTC sinus medication for 3-4 days.   Patient states that she has also been taking ibuprofen and tylenol for pain at home.

## 2014-09-04 NOTE — Discharge Instructions (Signed)
1. Medications: Percocet, clindamycin, usual home medications 2. Treatment: rest, drink plenty of fluids, take medications as prescribed 3. Follow Up: Please followup with dentistry within 1 week for discussion of your diagnoses and further evaluation after today's visit; if you do not have a primary care doctor use the resource guide provided to find one; Return to the ER for high fevers, difficulty breathing, difficulty swallowing or other concerning symptoms    Dental Abscess A dental abscess is a collection of infected fluid (pus) from a bacterial infection in the inner part of the tooth (pulp). It usually occurs at the end of the tooth's root.  CAUSES   Severe tooth decay.  Trauma to the tooth that allows bacteria to enter into the pulp, such as a broken or chipped tooth. SYMPTOMS   Severe pain in and around the infected tooth.  Swelling and redness around the abscessed tooth or in the mouth or face.  Tenderness.  Pus drainage.  Bad breath.  Bitter taste in the mouth.  Difficulty swallowing.  Difficulty opening the mouth.  Nausea.  Vomiting.  Chills.  Swollen neck glands. DIAGNOSIS   A medical and dental history will be taken.  An examination will be performed by tapping on the abscessed tooth.  X-rays may be taken of the tooth to identify the abscess. TREATMENT The goal of treatment is to eliminate the infection. You may be prescribed antibiotic medicine to stop the infection from spreading. A root canal may be performed to save the tooth. If the tooth cannot be saved, it may be pulled (extracted) and the abscess may be drained.  HOME CARE INSTRUCTIONS  Only take over-the-counter or prescription medicines for pain, fever, or discomfort as directed by your caregiver.  Rinse your mouth (gargle) often with salt water ( tsp salt in 8 oz [250 ml] of warm water) to relieve pain or swelling.  Do not drive after taking pain medicine (narcotics).  Do not apply  heat to the outside of your face.  Return to your dentist for further treatment as directed. SEEK MEDICAL CARE IF:  Your pain is not helped by medicine.  Your pain is getting worse instead of better. SEEK IMMEDIATE MEDICAL CARE IF:  You have a fever or persistent symptoms for more than 2-3 days.  You have a fever and your symptoms suddenly get worse.  You have chills or a very bad headache.  You have problems breathing or swallowing.  You have trouble opening your mouth.  You have swelling in the neck or around the eye. Document Released: 10/06/2005 Document Revised: 06/30/2012 Document Reviewed: 01/14/2011 [facility] Patient Information 2015 [place], [place]. This information is not intended to replace advice given to you by your health care provider. Make sure you discuss any questions you have with your health care provider.    Emergency Department Resource Guide 1) Find a Doctor and Pay Out of Pocket Although you won't have to find out who is covered by your insurance plan, it is a good idea to ask around and get recommendations. You will then need to call the office and see if the doctor you have chosen will accept you as a new patient and what types of options they offer for patients who are self-pay. Some doctors offer discounts or will set up payment plans for their patients who do not have insurance, but you will need to ask so you aren't surprised when you get to your appointment.  2) Contact Your Local Health Department Not all  health departments have doctors that can see patients for sick visits, but many do, so it is worth a call to see if yours does. If you don't know where your local health department is, you can check in your phone book. The CDC also has a tool to help you locate your state's health department, and many state websites also have listings of all of their local health departments. ° °3) Find a Walk-in Clinic °If your illness is not likely to be very severe or  complicated, you may want to try a walk in clinic. These are popping up all over the country in pharmacies, drugstores, and shopping centers. They're usually staffed by nurse practitioners or physician assistants that have been trained to treat common illnesses and complaints. They're usually fairly quick and inexpensive. However, if you have serious medical issues or chronic medical problems, these are probably not your best option. ° °No Primary Care Doctor: °- Call Health Connect at  832-8000 - they can help you locate a primary care doctor that  accepts your insurance, provides certain services, etc. °- Physician Referral Service- 1-800-533-3463 ° °Chronic Pain Problems: °Organization         Address  Phone   Notes  °[facility]  (336) 297-2271 Patients need to be referred by their primary care doctor.  ° °Medication Assistance: °Organization         Address  Phone   Notes  °[place] [facility] [address] °[place], [place] [address] (336) 641-8030 --Must be a resident of [place] °-- Must have NO insurance coverage whatsoever (no Medicaid/ Medicare, etc.) °-- The pt. MUST have a primary care doctor that directs their care regularly and follows them in the community °  °[facility]  (866) 331-1348   °[employer]  (888) 892-1162   ° °Agencies that provide inexpensive medical care: °Organization         Address  Phone   Notes  °[facility]  (336) 832-8035   °[facility]    (336) 832-7272   °[facility] [address] °[place], [place] [address] (336) 832-4777   °[facility] [address], °[place] (336) 271-4999   °[facility]    (336) 373-0678   °[facility]    (336) 272-1050   °[facility] ° [address], [place] Phone:  (336) 832-4444, Fax:  (336) 832-4440 Hours of Operation:  9 am - 6 pm, M-F.  Also accepts  Medicaid/Medicare and self-pay.  °[facility] ° [address], [place] Phone: (336) 832-3150, Fax: (336) [phone]. Hours of Operation:  8:30 am - 5:30 pm, M-F.  Also accepts Medicaid and self-pay.  °[facility] [place] [address], [place] Phone: (336) 878-6027   °[facility] [address], [place], [place] (336)723-1848, Ext. 123 Mondays & Thursdays: 7-9 AM.  First 15 patients are seen on a first come, first serve basis. °  ° °Medicaid-accepting [place] Providers: ° °Organization         Address  Phone   Notes  °[facility] [address], [place] (336) 641-2100 Also accepts self-pay patients.  °[facility] [address], [place] ° (336) 856-9996   °[facility] [address], [place] (336) 288-8857   °[facility] [address] [place] [address], [place] (336) 299-7000   °Veita Bland [address], [place]  ° (336) 373-1557 Only accepts  Access Medicaid patients after they have their name applied to their card.  ° °Self-Pay (no insurance) in [place]: ° °Organization         Address  Phone   Notes  °Sickle Cell Patients, [facility] [address], [place] (336) 832-1970   °[facility] [address], [place] (336) 832-4400   ° [facility] ° [address], [place] (336) 992-4800   °[facility]/Dr. Osei-Bonsu ° [address] [place] [address], [place] or [address], [place] (336) [phone] Phone number for both [place] and [place] locations is the same.  °[facility] [address], [place] (336) 299-0000   °[facility] [address] [place] [address], [place] or [address] (336) 852-7530 °(336) 878-2260   °[facility] [address], [place] (336)  350-1642, phone; (336) 294-5005, fax Sees patients 1st and 3rd Saturday of every month.  Must not qualify for public or private insurance (i.e. Medicaid, Medicare, [place] Health Choice, Veterans' Benefits) • Household income should be no more than 200% of the poverty level •The clinic cannot treat you if you are pregnant or think you are pregnant • Sexually transmitted diseases are not treated at the clinic.  ° ° °Dental Care: °Organization         Address  Phone  Notes  °[place] [facility] [address], [place] (336) 641-6152 Accepts children up to age 21 who are enrolled in Medicaid or [place] Health Choice; pregnant women with a Medicaid card; and children who have applied for Medicaid or [place] Health Choice, but were declined, whose parents can pay a reduced fee at time of service.  °[facility] [place]  [address], [place] (336) 641-7733 Accepts children up to age 21 who are enrolled in Medicaid or [place] Health Choice; pregnant women with a Medicaid card; and children who have applied for Medicaid or  Health Choice, but were declined, whose parents can pay a reduced fee at time of service.  °[facility] ° [address], [place] (336) 641-4533 Patients are seen by appointment only. Walk-ins are not accepted. [facility] will see patients 18 years of age and older. °Monday - Tuesday (8am-5pm) °Most Wednesdays (8:30-5pm) °$30 per visit, cash only  °[facility] ° [address], [place] (336) 641-4533 Patients are seen by appointment only. Walk-ins are not accepted. [facility] will see patients 18 years of age and older. °One Wednesday Evening (Monthly: Volunteer Based).  $30 per visit, cash only  °[facility]  (919) [phone] for adults; Children under age 4, call Graduate Pediatric Dentistry at ([phone]) [phone]. Children aged  4-14, please call ([phone]) [phone] to request a pediatric application. ° Dental services are provided in all areas of dental care including fillings, crowns and bridges, complete and partial dentures, implants, gum treatment, root canals, and extractions. Preventive care is also provided. Treatment is provided to both adults and children. °Patients are selected via a lottery and there is often a waiting list. °  °[facility] [address], ° ° (336) 763-8833 [email or web address] °  °[facility] [address], [place], [place] (336)723-1848, Ext. 123 Second and Fourth Thursday of each month, opens at 6:30 AM; Clinic ends at 9 AM.  Patients are seen on a first-come first-served basis, and a limited number are seen during each clinic.  ° °[facility] ° [address], [place], [place] (336) 723-7904   Eligibility Requirements °You must have lived in [place], [place], or [place] counties for at least the last three months. °  You cannot be eligible for state or federal sponsored healthcare insurance, including Veterans Administration, Medicaid, or Medicare. °  You generally cannot be eligible for healthcare insurance through your employer.  °  How to apply: °Eligibility screenings are held every Tuesday and Wednesday afternoon from 1:00 pm until 4:00 pm. You do not need an appointment for the interview!  °[facility] [address], [place], [place] 336-631-2330   °[facility]  336-342-8273   °[facility]  336-703-3100   °[facility]  336-570-6415   ° °Behavioral Health Resources in the Community: °Intensive Outpatient Programs °Organization         Address  Phone  Notes  °[facility] [address], [place], [place] 336-878-6098   °[facility] [address], [place], [place] 336-832-9800   °[facility] [address],  [place], [place] ° 336-882-2125   °[facility] [address],  °[place], [place] 1-800-853-5163 or 336-641-4981   °Substance Abuse Resources °Organization         Address  Phone  Notes  °[facility]  336-882-2125   °[facility]  336-784-9470   °[facility]  336-285-9073   °[facility]  336-845-3988   °Residential & Outpatient Substance Abuse Program  1-800-659-3381   °Psychological Services °Organization         Address  Phone  Notes  °[facility]  336- 832-9600   °[facility]  336- 378-7881   °[facility] [address], [place] 1-800-853-5163 or 336-641-4981   ° °Mobile Crisis Teams °Organization         Address  Phone  Notes  °[facility], Mobile Crisis Care Unit  1-877-626-1772   °Assertive °[facility] ° [address] [place], [place] 336-834-9664   °Sharon DeEsch [address] °[place] 336-554-5454   ° °Self-Help/Support Groups °Organization         Address  Phone             Notes  °[facility] [place] - variety of support groups  336- 373-1402 Call for more information  °Narcotics Anonymous (NA), [facility] [address], °[place]  2 meetings at this location  ° °Residential Treatment Programs °Organization         Address  Phone  Notes  °[facility] [address],    °[place]  1-866-801-8205   °[facility] ° [address], [place], [place] 704-293-8524   °[facility] [address], [place] 336-845-3988 Admissions: 8am-3pm M-F  °[facility] [address]    °[place], [place] 336-841-1104   °[facility] [address], [place], [place] 336-379-7146   °[facility] [address]  °[place],  336-285-9073   °[facility] - Intensive Outpatient [address], [place], [place]   [facility]  ([place]  [place], [place] 1-(514)874-3997 or 5172731580   [facility] [address] [place], [place] Accepts Medicaid  [facility] [address],  [place] 1-220 103 7357 Substance Abuse/Addiction Treatment   [facility]         Address  Phone  Notes  [facility]  3163986471   Domenic Schwab, PhD [address] [place], [place]   (670) 623-7445 or 226-313-1475   [facility] [place], [place] (707)020-8282   [address], [place], [place] [phone] Insurance/Medicaid/sponsorship through [facility] and Families [address]                                    [place], [place] [phone] [address] [place], [place] (484)[phone]    Dr. Adele Schilder  [phone]   [facility]. 1) [address],  2) [place] 3)  [address], [place] ([phone]) [phone]  [phone]   [place] [phone] or [phone] (After Hours)

## 2014-09-04 NOTE — ED Provider Notes (Signed)
CSN: [id number]     Arrival date & time 09/04/14  0747 History   First MD Initiated Contact with Patient 09/04/14 [phone]     Chief Complaint  Patient presents with  . Dental Pain  . Facial Pain     (Consider location/radiation/quality/duration/timing/severity/associated sxs/prior Treatment) The history is provided by the patient and medical records. No language interpreter was used.     Michelle Waters is a 43 y.o. female  with a hx of GERD presents to the Emergency Department complaining of gradual, persistent, progressively worsening left sided sinus pressure onset 2 night ago and she treated they symptoms with sinus medicine and tylenol without relief.  Pt reports she then began to have swelling in her nose and upper dental pain several hours later.  Pt denies URI but reports chronic sinus issues. Associated symptoms include headaches, face swelling and sinus pain.  Nothing makes it better and nothing makes it worse.  Pt denies fever, chills, neck pain, neck Stiffness, chest pain, shortness of breath, abdominal pain, nausea, vomiting, diarrhea, weakness, dizziness, syncope.     Past Medical History  Diagnosis Date  . Acid reflux    History reviewed. No pertinent past surgical history. No family history on file. History  Substance Use Topics  . Smoking status: Current Every Day Smoker  . Smokeless tobacco: Not on file  . Alcohol Use: Yes   OB History    No data available     Review of Systems  Constitutional: Negative for fever, chills and appetite change.  HENT: Positive for congestion (sinus congestion), dental problem and facial swelling. Negative for drooling, ear pain, nosebleeds, postnasal drip, rhinorrhea and trouble swallowing.   Eyes: Negative for pain and redness.  Respiratory: Negative for cough and wheezing.   Cardiovascular: Negative for chest pain.  Gastrointestinal: Negative for nausea, vomiting and abdominal pain.  Musculoskeletal: Negative for neck pain and  neck stiffness.  Skin: Negative for color change and rash.  Neurological: Positive for headaches. Negative for weakness and light-headedness.  All other systems reviewed and are negative.     Allergies  Ampicillin; Penicillins; Codeine; and Hydrocodone  Home Medications   Prior to Admission medications   Medication Sig Start Date End Date Taking? Authorizing Provider  carbamide peroxide (DEBROX) 6.5 % otic solution Place 5 drops into the right ear 2 (two) times daily. 11/05/13   Idalia Needle. Sanford, PA-C  clindamycin (CLEOCIN) 150 MG capsule Take 3 capsules (450 mg total) by mouth 3 (three) times daily. 09/04/14   Lovel Suazo, PA-C  ibuprofen (ADVIL,MOTRIN) 200 MG tablet Take 200-400 mg by mouth every 6 (six) hours as needed for pain or headache.    Historical Provider, MD  oxyCODONE-acetaminophen (PERCOCET) 10-325 MG per tablet Take 1 tablet by mouth every 4 (four) hours as needed for pain. 09/04/14   Kerman Pfost, PA-C  pseudoephedrine (SUDAFED) 30 MG tablet Take 30 mg by mouth every 4 (four) hours as needed for congestion.    Historical Provider, MD  pseudoephedrine (SUDAFED) 30 MG tablet Take 1 tablet (30 mg total) by mouth every 4 (four) hours as needed for congestion. 11/05/13   Idalia Needle. Sanford, PA-C   BP 126/78 mmHg  Pulse 66  Temp(Src) 98.5 F (36.9 C) (Oral)  Resp 18  SpO2 100% Physical Exam  Constitutional: She appears well-developed and well-nourished.  HENT:  Head: Normocephalic.  Right Ear: Tympanic membrane, external ear and ear canal normal.  Left Ear: Tympanic membrane, external ear and ear canal normal.  Nose: Right sinus exhibits no maxillary sinus tenderness and no frontal sinus tenderness. Left sinus exhibits maxillary sinus tenderness. Left sinus exhibits no frontal sinus tenderness.  Mouth/Throat: Uvula is midline, oropharynx is clear and moist and mucous membranes are normal. No oral lesions. Abnormal dentition. Dental caries present. No uvula  swelling or lacerations. No oropharyngeal exudate, posterior oropharyngeal edema, posterior oropharyngeal erythema or tonsillar abscesses.  Moderate gingival swelling and induration without distinct area of fluctuance around tooth number 8,9 and 10 Dental caries throughout and poor dentition Induration palpable along the upper lip into the left knee are and over the left maxillary sinus   Eyes: Conjunctivae are normal. Pupils are equal, round, and reactive to light. Right eye exhibits no discharge. Left eye exhibits no discharge.  Neck: Normal range of motion. Neck supple.  No stridor Handling secretions without difficulty No nuchal rigidity No cervical lymphadenopathy   Cardiovascular: Normal rate, regular rhythm and normal heart sounds.   Pulmonary/Chest: Effort normal. No respiratory distress.  Equal chest rise  Abdominal: Soft. Bowel sounds are normal. She exhibits no distension. There is no tenderness.  Lymphadenopathy:    She has no cervical adenopathy.  Neurological: She is alert.  Skin: Skin is warm and dry.  Psychiatric: She has a normal mood and affect.  Nursing note and vitals reviewed.   ED Course  Procedures (including critical care time) Labs Review Labs Reviewed  I-STAT CHEM 8, ED - Abnormal; Notable for the following:    Calcium, Ion 1.25 (*)    All other components within normal limits  CBC    Imaging Review Ct Maxillofacial W/cm  09/04/2014   CLINICAL DATA:  Dental abscess  EXAM: CT MAXILLOFACIAL WITH CONTRAST  TECHNIQUE: Multidetector CT imaging of the maxillofacial structures was performed with intravenous contrast. Multiplanar CT image reconstructions were also generated. A small metallic BB was placed on the right temple in order to reliably differentiate right from left.  CONTRAST:  43mL OMNIPAQUE IOHEXOL 300 MG/ML  SOLN  COMPARISON:  None.  FINDINGS: There are numerous missing teeth. There is no focal fluid collection to suggest an abscess. There is a  dental carie of the left maxillary central incisor. There is a dental carie and periapical lucency of of the right maxillary canine. There is a dental carie of the left maxillary first molar with periapical lucency. There is a fractured root chest posterior to the left maxillary first molar. There is periapical lucency around the left maxillary canine. There is a dental carie of the right mandibular molar.  The globes are intact. The orbital walls are intact. The orbital floors are intact. The maxilla is intact. The mandible is intact. The zygomatic arches are intact. The nasal septum is midline. There is no nasal bone fracture. The temporomandibular joints are normal.  Mild right maxillary sinus mucosal thickening. The visualized portions of the mastoid sinuses are well aerated.  IMPRESSION: 1. Dental disease as described above.   Electronically Signed   By: Kathreen Devoid   On: 09/04/2014 10:24     EKG Interpretation None      MDM   Final diagnoses:  Dental abscess  Pain due to dental caries    [place] presents with dental caries, poor dentition and dental pain in association with left sinus pain and swelling and induration of the face. Concern for possible deep seeded dental infection versus sinusitis pain control, CT maxillofacial to assess for abscess and reassess.  Patient afebrile without tachycardia.  11:11 AM  CT with periapical lucency of the left maxillary first molar without discrete abscess.  Exam unconcerning for Ludwig's angina or spread of infection.  Will treat with clindamycin and pain medicine.  Urged patient to follow-up with dentist.    I have personally reviewed patient's vitals, nursing note and any pertinent labs or imaging.  I performed an focused physical exam; undressed when appropriate .    It has been determined that no acute conditions requiring further emergency intervention are present at this time. The patient/guardian have been advised of the diagnosis  and plan. I reviewed any labs and imaging including any potential incidental findings. We have discussed signs and symptoms that warrant return to the ED and they are listed in the discharge instructions.    Vital signs are stable at discharge.   BP 126/78 mmHg  Pulse 66  Temp(Src) 98.5 F (36.9 C) (Oral)  Resp 18  SpO2 100%           Abigail Butts, PA-C 09/04/14 1120  Dorie Rank, MD 09/05/14 [id number]

## 2014-09-04 NOTE — ED Notes (Signed)
Left uipper tooth pain and sinus pain x 3-4 daYS

## 2014-11-01 ENCOUNTER — Emergency Department (HOSPITAL_COMMUNITY)
Admission: EM | Admit: 2014-11-01 | Discharge: 2014-11-01 | Disposition: A | Discharge status: Home / Self Care | Payer: No Typology Code available for payment source | Attending: Emergency Medicine | Admitting: Emergency Medicine

## 2014-11-01 ENCOUNTER — Emergency Department (HOSPITAL_COMMUNITY): Payer: No Typology Code available for payment source

## 2014-11-01 ENCOUNTER — Encounter (HOSPITAL_COMMUNITY): Payer: Self-pay | Admitting: *Deleted

## 2014-11-01 DIAGNOSIS — Z79899 Other long term (current) drug therapy: Secondary | ICD-10-CM | POA: Insufficient documentation

## 2014-11-01 DIAGNOSIS — R059 Cough, unspecified: Secondary | ICD-10-CM

## 2014-11-01 DIAGNOSIS — R05 Cough: Secondary | ICD-10-CM | POA: Diagnosis present

## 2014-11-01 DIAGNOSIS — Z792 Long term (current) use of antibiotics: Secondary | ICD-10-CM | POA: Insufficient documentation

## 2014-11-01 DIAGNOSIS — R0789 Other chest pain: Secondary | ICD-10-CM | POA: Insufficient documentation

## 2014-11-01 DIAGNOSIS — Z8719 Personal history of other diseases of the digestive system: Secondary | ICD-10-CM | POA: Diagnosis not present

## 2014-11-01 DIAGNOSIS — Z720 Tobacco use: Secondary | ICD-10-CM | POA: Diagnosis not present

## 2014-11-01 DIAGNOSIS — Z880 Allergy status to penicillin: Secondary | ICD-10-CM | POA: Insufficient documentation

## 2014-11-01 DIAGNOSIS — J069 Acute upper respiratory infection, unspecified: Secondary | ICD-10-CM

## 2014-11-01 LAB — I-STAT TROPONIN, ED: Troponin i, poc: 0 ng/mL (ref 0.00–0.08)

## 2014-11-01 MED ORDER — GUAIFENESIN 100 MG/5ML PO SOLN
5.0000 mL | Freq: Once | ORAL | Status: AC
  Administered 2014-11-01: 100 mg via ORAL
  Filled 2014-11-01: qty 5

## 2014-11-01 MED ORDER — IBUPROFEN 800 MG PO TABS: 800.0000 mg | ORAL_TABLET | Freq: Three times a day (TID) | ORAL | Status: DC

## 2014-11-01 MED ORDER — ALBUTEROL SULFATE HFA 108 (90 BASE) MCG/ACT IN AERS
2.0000 | INHALATION_SPRAY | Freq: Once | RESPIRATORY_TRACT | Status: AC
  Administered 2014-11-01: 2 via RESPIRATORY_TRACT
  Filled 2014-11-01 (×2): qty 6.7

## 2014-11-01 MED ORDER — IBUPROFEN 800 MG PO TABS
800.0000 mg | ORAL_TABLET | Freq: Once | ORAL | Status: AC
  Administered 2014-11-01: 800 mg via ORAL
  Filled 2014-11-01: qty 1

## 2014-11-01 NOTE — Discharge Instructions (Signed)
Please follow up with your primary care physician in 1-2 days. If you do not have one please call the [place] number listed above. Please alternate between Motrin and Tylenol every three hours for fevers and pain. Please use your inhaler 2 puffs every four to six hours as needed for cough. Please read all discharge instructions and return precautions.   Upper Respiratory Infection, Adult An upper respiratory infection (URI) is also sometimes known as the common cold. The upper respiratory tract includes the nose, sinuses, throat, trachea, and bronchi. Bronchi are the airways leading to the lungs. Most people improve within 1 week, but symptoms can last up to 2 weeks. A residual cough may last even longer.  CAUSES Many different viruses can infect the tissues lining the upper respiratory tract. The tissues become irritated and inflamed and often become very moist. Mucus production is also common. A cold is contagious. You can easily spread the virus to others by oral contact. This includes kissing, sharing a glass, coughing, or sneezing. Touching your mouth or nose and then touching a surface, which is then touched by another person, can also spread the virus. SYMPTOMS  Symptoms typically develop 1 to 3 days after you come in contact with a cold virus. Symptoms vary from person to person. They may include:  Runny nose.  Sneezing.  Nasal congestion.  Sinus irritation.  Sore throat.  Loss of voice (laryngitis).  Cough.  Fatigue.  Muscle aches.  Loss of appetite.  Headache.  Low-grade fever. DIAGNOSIS  You might diagnose your own cold based on familiar symptoms, since most people get a cold 2 to 3 times a year. Your caregiver can confirm this based on your exam. Most importantly, your caregiver can check that your symptoms are not due to another disease such as strep throat, sinusitis, pneumonia, asthma, or epiglottitis. Blood tests, throat tests, and X-rays are  not necessary to diagnose a common cold, but they may sometimes be helpful in excluding other more serious diseases. Your caregiver will decide if any further tests are required. RISKS AND COMPLICATIONS  You may be at risk for a more severe case of the common cold if you smoke cigarettes, have chronic heart disease (such as heart failure) or lung disease (such as asthma), or if you have a weakened immune system. The very young and very old are also at risk for more serious infections. Bacterial sinusitis, middle ear infections, and bacterial pneumonia can complicate the common cold. The common cold can worsen asthma and chronic obstructive pulmonary disease (COPD). Sometimes, these complications can require emergency medical care and may be life-threatening. PREVENTION  The best way to protect against getting a cold is to practice good hygiene. Avoid oral or hand contact with people with cold symptoms. Wash your hands often if contact occurs. There is no clear evidence that vitamin C, vitamin E, echinacea, or exercise reduces the chance of developing a cold. However, it is always recommended to get plenty of rest and practice good nutrition. TREATMENT  Treatment is directed at relieving symptoms. There is no cure. Antibiotics are not effective, because the infection is caused by a virus, not by bacteria. Treatment may include:  Increased fluid intake. Sports drinks offer valuable electrolytes, sugars, and fluids.  Breathing heated mist or steam (vaporizer or shower).  Eating chicken soup or other clear broths, and maintaining good nutrition.  Getting plenty of rest.  Using gargles or lozenges for comfort.  Controlling fevers with ibuprofen or acetaminophen  as directed by your caregiver.  Increasing usage of your inhaler if you have asthma. Zinc gel and zinc lozenges, taken in the first 24 hours of the common cold, can shorten the duration and lessen the severity of symptoms. Pain medicines may  help with fever, muscle aches, and throat pain. A variety of non-prescription medicines are available to treat congestion and runny nose. Your caregiver can make recommendations and may suggest nasal or lung inhalers for other symptoms.  HOME CARE INSTRUCTIONS   Only take over-the-counter or prescription medicines for pain, discomfort, or fever as directed by your caregiver.  Use a warm mist humidifier or inhale steam from a shower to increase air moisture. This may keep secretions moist and make it easier to breathe.  Drink enough water and fluids to keep your urine clear or pale yellow.  Rest as needed.  Return to work when your temperature has returned to normal or as your caregiver advises. You may need to stay home longer to avoid infecting others. You can also use a face mask and careful hand washing to prevent spread of the virus. SEEK MEDICAL CARE IF:   After the first few days, you feel you are getting worse rather than better.  You need your caregiver's advice about medicines to control symptoms.  You develop chills, worsening shortness of breath, or brown or red sputum. These may be signs of pneumonia.  You develop yellow or brown nasal discharge or pain in the face, especially when you bend forward. These may be signs of sinusitis.  You develop a fever, swollen neck glands, pain with swallowing, or white areas in the back of your throat. These may be signs of strep throat. SEEK IMMEDIATE MEDICAL CARE IF:   You have a fever.  You develop severe or persistent headache, ear pain, sinus pain, or chest pain.  You develop wheezing, a prolonged cough, cough up blood, or have a change in your usual mucus (if you have chronic lung disease).  You develop sore muscles or a stiff neck. Document Released: 04/01/2001 Document Revised: 12/29/2011 Document Reviewed: 01/11/2014 [facility] Patient Information 2015 [place], [place]. This information is not intended to replace advice given to  you by your health care provider. Make sure you discuss any questions you have with your health care provider.  Chest Wall Pain Chest wall pain is pain in or around the bones and muscles of your chest. It may take up to 6 weeks to get better. It may take longer if you must stay physically active in your work and activities.  CAUSES  Chest wall pain may happen on its own. However, it may be caused by:  A viral illness like the flu.  Injury.  Coughing.  Exercise.  Arthritis.  Fibromyalgia.  Shingles. HOME CARE INSTRUCTIONS   Avoid overtiring physical activity. Try not to strain or perform activities that cause pain. This includes any activities using your chest or your abdominal and side muscles, especially if heavy weights are used.  Put ice on the sore area.  Put ice in a plastic bag.  Place a towel between your skin and the bag.  Leave the ice on for 15-20 minutes per hour while awake for the first 2 days.  Only take over-the-counter or prescription medicines for pain, discomfort, or fever as directed by your caregiver. SEEK IMMEDIATE MEDICAL CARE IF:   Your pain increases, or you are very uncomfortable.  You have a fever.  Your chest pain becomes worse.  You have  new, unexplained symptoms.  You have nausea or vomiting.  You feel sweaty or lightheaded.  You have a cough with phlegm (sputum), or you cough up blood. MAKE SURE YOU:   Understand these instructions.  Will watch your condition.  Will get help right away if you are not doing well or get worse. Document Released: 10/06/2005 Document Revised: 12/29/2011 Document Reviewed: 06/02/2011 [facility] Patient Information 2015 [place], [place]. This information is not intended to replace advice given to you by your health care provider. Make sure you discuss any questions you have with your health care provider.

## 2014-11-01 NOTE — ED Notes (Signed)
Pt reports having a productive cough with yellow sputum, has left rib pain when she coughs. Fever x 2 days.

## 2014-11-01 NOTE — ED Provider Notes (Signed)
CSN: [id number]     Arrival date & time 11/01/14  1645 History   First MD Initiated Contact with Patient 11/01/14 2012     Chief Complaint  Patient presents with  . Cough     (Consider location/radiation/quality/duration/timing/severity/associated sxs/prior Treatment) HPI Comments: Patient is a 44 year old female past medical history significant for acid reflux, tobacco abuse presenting to the emergency department for one week of productive cough with yellow sputum production. She states she had a subjective fever and chills 2 days ago. She states she also developed a left-sided chest pain with radiation to her arm precipitated by coughing or movement. She states she has had a few episodes of nonbloody posttussive emesis. She states she has tried TheraFlu and an over-the-counter antihistamine with little to no improvement of her symptoms. No sick contacts noted. No early familial cardiac history. PERC negative.   Patient is a 44 y.o. female presenting with cough.  Cough Associated symptoms: chest pain and fever (subjective)   Associated symptoms: no chills     Past Medical History  Diagnosis Date  . Acid reflux    History reviewed. No pertinent past surgical history. History reviewed. No pertinent family history. History  Substance Use Topics  . Smoking status: Current Every Day Smoker  . Smokeless tobacco: Not on file  . Alcohol Use: Yes   OB History    No data available     Review of Systems  Constitutional: Positive for fever (subjective). Negative for chills.  Respiratory: Positive for cough.   Cardiovascular: Positive for chest pain. Negative for leg swelling.  All other systems reviewed and are negative.     Allergies  Ampicillin; Penicillins; Codeine; and Hydrocodone  Home Medications   Prior to Admission medications   Medication Sig Start Date End Date Taking? Authorizing Provider  carbamide peroxide (DEBROX) 6.5 % otic solution Place 5 drops into the right  ear 2 (two) times daily. 11/05/13   Idalia Needle. Sanford, PA-C  clindamycin (CLEOCIN) 150 MG capsule Take 3 capsules (450 mg total) by mouth 3 (three) times daily. 09/04/14   Hannah Muthersbaugh, PA-C  ibuprofen (ADVIL,MOTRIN) 200 MG tablet Take 200-400 mg by mouth every 6 (six) hours as needed for pain or headache.    Historical Provider, MD  ibuprofen (ADVIL,MOTRIN) 800 MG tablet Take 1 tablet (800 mg total) by mouth 3 (three) times daily. 11/01/14   Azul Coffie L Octavia Mottola, PA-C  oxyCODONE-acetaminophen (PERCOCET) 10-325 MG per tablet Take 1 tablet by mouth every 4 (four) hours as needed for pain. 09/04/14   Hannah Muthersbaugh, PA-C  pseudoephedrine (SUDAFED) 30 MG tablet Take 30 mg by mouth every 4 (four) hours as needed for congestion.    Historical Provider, MD  pseudoephedrine (SUDAFED) 30 MG tablet Take 1 tablet (30 mg total) by mouth every 4 (four) hours as needed for congestion. 11/05/13   Idalia Needle. Sanford, PA-C   BP 147/105 mmHg  Pulse 72  Temp(Src) 98.1 F (36.7 C)  Resp 14  SpO2 100% Physical Exam  Constitutional: She is oriented to person, place, and time. She appears well-developed and well-nourished. No distress.  HENT:  Head: Normocephalic and atraumatic.  Right Ear: External ear normal.  Left Ear: External ear normal.  Nose: Nose normal.  Mouth/Throat: Oropharynx is clear and moist. No oropharyngeal exudate.  Eyes: Conjunctivae are normal.  Neck: Normal range of motion. Neck supple.  Cardiovascular: Normal rate, regular rhythm, normal heart sounds and intact distal pulses.   Pulmonary/Chest: Effort normal and breath sounds normal.  No respiratory distress. She exhibits tenderness. She exhibits no crepitus, no deformity, no swelling and no retraction.    Cough on examination.   Abdominal: Soft. There is no tenderness.  Musculoskeletal: Normal range of motion.  Neurological: She is alert and oriented to person, place, and time.  Skin: Skin is warm and dry. No rash noted.  She is not diaphoretic.  Psychiatric: She has a normal mood and affect.  Nursing note and vitals reviewed.   ED Course  Procedures (including critical care time) Medications  guaiFENesin (ROBITUSSIN) 100 MG/5ML solution 100 mg (100 mg Oral Given 11/01/14 2101)  albuterol (PROVENTIL HFA;VENTOLIN HFA) 108 (90 BASE) MCG/ACT inhaler 2 puff (2 puffs Inhalation Given 11/01/14 2105)  ibuprofen (ADVIL,MOTRIN) tablet 800 mg (800 mg Oral Given 11/01/14 2101)    [place], ED    Imaging Review Dg Chest 2 View  11/01/2014   CLINICAL DATA:  Acute productive cough, left chest pain, fever  EXAM: CHEST  2 VIEW  COMPARISON:  04/19/2006  FINDINGS: The heart size and mediastinal contours are within normal limits. Both lungs are clear. The visualized skeletal structures are unremarkable.  IMPRESSION: No active cardiopulmonary disease.   Electronically Signed   By: Daryll Brod M.D.   On: 11/01/2014 18:03     EKG Interpretation None      MDM   Final diagnoses:  Upper respiratory infection  Left-sided chest wall pain    Filed Vitals:   11/01/14 2145  BP: 147/105  Pulse: 72  Temp:   Resp: 14   Afebrile, NAD, non-toxic appearing, AAOx4.  I have reviewed nursing notes, vital signs, and all appropriate lab and imaging results for this patient. Patient is to be discharged with recommendation to follow up with PCP in regards to today's hospital visit. Perc negative, VSS, no tracheal deviation, no JVD or new murmur, RRR, breath sounds equal bilaterally, EKG without acute abnormalities, negative troponin. Pt CXR negative for acute infiltrate. Patients symptoms are consistent with URI, likely viral etiology. Discussed that antibiotics are not indicated for viral infections. Pt will be discharged with symptomatic treatment.   Patient noted to be hypertensive in the emergency department.  No signs of hypertensive urgency.  Discussed with patient the need for close  follow-up and management by their primary care physician.    Discussed need for repeat blood pressure with PCP in 1-2 weeks. Verbalizes understanding and is agreeable with plan. Pt is hemodynamically stable & in NAD prior to dc. Patient is stable at time of discharge   Harlow Mares, PA-C 11/02/14 Glynis Smiles, MD 11/02/14 [phone]

## 2015-01-17 ENCOUNTER — Encounter: Payer: Self-pay | Admitting: Internal Medicine

## 2015-01-17 ENCOUNTER — Ambulatory Visit: Payer: No Typology Code available for payment source | Attending: Internal Medicine | Admitting: Internal Medicine

## 2015-01-17 VITALS — BP 142/99 | HR 65 | Temp 98.2°F | Resp 16 | Ht 59.0 in | Wt 128.0 lb

## 2015-01-17 DIAGNOSIS — F17200 Nicotine dependence, unspecified, uncomplicated: Secondary | ICD-10-CM | POA: Insufficient documentation

## 2015-01-17 DIAGNOSIS — R030 Elevated blood-pressure reading, without diagnosis of hypertension: Secondary | ICD-10-CM

## 2015-01-17 DIAGNOSIS — I10 Essential (primary) hypertension: Secondary | ICD-10-CM

## 2015-01-17 DIAGNOSIS — Z720 Tobacco use: Secondary | ICD-10-CM

## 2015-01-17 DIAGNOSIS — IMO0001 Reserved for inherently not codable concepts without codable children: Secondary | ICD-10-CM

## 2015-01-17 DIAGNOSIS — Z833 Family history of diabetes mellitus: Secondary | ICD-10-CM

## 2015-01-17 LAB — CBC
HCT: 37.6 % (ref 36.0–46.0)
HEMOGLOBIN: 12.2 g/dL (ref 12.0–15.0)
MCH: 28 pg (ref 26.0–34.0)
MCHC: 32.4 g/dL (ref 30.0–36.0)
MCV: 86.4 fL (ref 78.0–100.0)
MPV: 10.7 fL (ref 8.6–12.4)
PLATELETS: 300 10*3/uL (ref 150–400)
RBC: 4.35 MIL/uL (ref 3.87–5.11)
RDW: 14.3 % (ref 11.5–15.5)
WBC: 6.5 10*3/uL (ref 4.0–10.5)

## 2015-01-17 LAB — COMPLETE METABOLIC PANEL WITH GFR
ALT: 15 U/L (ref 0–35)
AST: 28 U/L (ref 0–37)
Albumin: 3.9 g/dL (ref 3.5–5.2)
Alkaline Phosphatase: 109 U/L (ref 39–117)
BILIRUBIN TOTAL: 0.3 mg/dL (ref 0.2–1.2)
BUN: 11 mg/dL (ref 6–23)
CO2: 26 meq/L (ref 19–32)
CREATININE: 0.9 mg/dL (ref 0.50–1.10)
Calcium: 10.1 mg/dL (ref 8.4–10.5)
Chloride: 106 mEq/L (ref 96–112)
GFR, EST AFRICAN AMERICAN: 89 mL/min
GFR, EST NON AFRICAN AMERICAN: 77 mL/min
Glucose, Bld: 93 mg/dL (ref 70–99)
Potassium: 5 mEq/L (ref 3.5–5.3)
Sodium: 137 mEq/L (ref 135–145)
Total Protein: 8.3 g/dL (ref 6.0–8.3)

## 2015-01-17 LAB — LIPID PANEL
CHOL/HDL RATIO: 2.9 ratio
CHOLESTEROL: 227 mg/dL — AB (ref 0–200)
HDL: 77 mg/dL (ref >=46)
LDL Cholesterol: 136 mg/dL — ABNORMAL HIGH (ref 0–99)
Triglycerides: 70 mg/dL (ref <150)
VLDL: 14 mg/dL (ref 0–40)

## 2015-01-17 MED ORDER — HYDROCHLOROTHIAZIDE 12.5 MG PO CAPS: 12.5000 mg | ORAL_CAPSULE | Freq: Every day | ORAL | Status: DC

## 2015-01-17 NOTE — Progress Notes (Signed)
Pt is here today to establish care. Pt was in the ED recently with an upper respiratory infection. Pt's BP was elevated and the ED encouraged her to see her PCP.

## 2015-01-17 NOTE — Progress Notes (Signed)
Patient ID: Michelle Waters, female   DOB: 08/05/1971, 44 y.o.   MRN: [id number]  UDJ:[id number]  HYI:[id number]  DOB - 1971/05/07  CC:  Chief Complaint  Patient presents with  . Establish Care       HPI: Michelle Waters is a 44 y.o. female here today to establish medical care.  Patient has no significant past medical history. He reports that he was seen in the ER two months ago for a URI and was found to have a elevated BP at that time. She was then encouraged to follow up with her PCP for further evaluation of her blood pressure. She is not up to date on her pap or mammogram. She admits to drinking 1-2 40 oz beers per day and smoking less than 1/2 ppd.    Allergies  Allergen Reactions  . Ampicillin Swelling and Rash  . Penicillins Swelling and Rash  . Codeine Itching  . Hydrocodone Itching   Past Medical History  Diagnosis Date  . Acid reflux    Current Outpatient Prescriptions on File Prior to Visit  Medication Sig Dispense Refill  . carbamide peroxide (DEBROX) 6.5 % otic solution Place 5 drops into the right ear 2 (two) times daily. (Patient not taking: Reported on 01/17/2015) 15 mL 0  . clindamycin (CLEOCIN) 150 MG capsule Take 3 capsules (450 mg total) by mouth 3 (three) times daily. (Patient not taking: Reported on 01/17/2015) 90 capsule 0  . ibuprofen (ADVIL,MOTRIN) 200 MG tablet Take 200-400 mg by mouth every 6 (six) hours as needed for pain or headache.    . ibuprofen (ADVIL,MOTRIN) 800 MG tablet Take 1 tablet (800 mg total) by mouth 3 (three) times daily. (Patient not taking: Reported on 01/17/2015) 21 tablet 0  . oxyCODONE-acetaminophen (PERCOCET) 10-325 MG per tablet Take 1 tablet by mouth every 4 (four) hours as needed for pain. (Patient not taking: Reported on 01/17/2015) 15 tablet 0  . pseudoephedrine (SUDAFED) 30 MG tablet Take 30 mg by mouth every 4 (four) hours as needed for congestion.    . pseudoephedrine (SUDAFED) 30 MG tablet Take 1 tablet (30 mg total) by mouth every 4  (four) hours as needed for congestion. (Patient not taking: Reported on 01/17/2015) 30 tablet 0   No current facility-administered medications on file prior to visit.   History reviewed. No pertinent family history. History   Social History  . Marital Status: Single    Spouse Name: N/A  . Number of Children: N/A  . Years of Education: N/A   Occupational History  . Not on file.   Social History Main Topics  . Smoking status: Current Every Day Smoker  . Smokeless tobacco: Not on file  . Alcohol Use: Yes  . Drug Use: No  . Sexual Activity: Not on file   Other Topics Concern  . Not on file   Social History Narrative    Review of Systems  Constitutional: Negative for fever and chills.  Eyes:       Need glasses   Respiratory: Positive for cough and shortness of breath.   Cardiovascular: Positive for leg swelling. Negative for chest pain and palpitations.  Genitourinary: Negative for frequency.  Neurological: Positive for dizziness, tingling (BUE) and headaches.  Endo/Heme/Allergies:       Dry mouth   All other systems reviewed and are negative.     Objective:   Filed Vitals:   01/17/15 1110  BP: 142/99  Pulse: 65  Temp: 98.2 F (36.8 C)  Resp: 16  Physical Exam  Constitutional: She is oriented to person, place, and time.  Neck: Normal range of motion. No JVD present.  Cardiovascular: Normal rate, regular rhythm and normal heart sounds.   Pulmonary/Chest: Effort normal and breath sounds normal.  Abdominal: Soft. Bowel sounds are normal.  Musculoskeletal: She exhibits no edema.  Neurological: She is alert and oriented to person, place, and time.  Skin: Skin is warm and dry.     Lab Results  Component Value Date   WBC 8.9 09/04/2014   HGB 14.6 09/04/2014   HCT 43.0 09/04/2014   MCV 88.2 09/04/2014   PLT 220 09/04/2014   Lab Results  Component Value Date   CREATININE 1.00 09/04/2014   BUN 8 09/04/2014   NA 140 09/04/2014   K 4.2 09/04/2014    CL 106 09/04/2014   CO2 28 07/31/2007    Lab Results  Component Value Date   HGBA1C  07/29/2007    5.5 (NOTE)   The ADA recommends the following therapeutic goals for glycemic   control related to Hgb A1C measurement:   Goal of Therapy:   < 7.0% Hgb A1C   Action Suggested:  > 8.0% Hgb A1C   Ref:  Diabetes Care, 22, Suppl. 1, 1999   Lipid Panel  No results found for: CHOL, TRIG, HDL, CHOLHDL, VLDL, LDLCALC     Assessment and plan:   Michelle Waters was seen today for establish care.  Diagnoses and all orders for this visit:  Elevated BP/Essential hypertension Orders: -     COMPLETE METABOLIC PANEL WITH GFR -     CBC -     hydrochlorothiazide (MICROZIDE) 12.5 MG capsule; Take 1 capsule (12.5 mg total) by mouth daily. Will begin medication to help patient get pressures controled and prevent headaches.  Smoker Smoking cessation discussed for 3 minutes, patient is not willing to quit at this time. Will continue to assess on each visit. Discussed increased risk for diseases such as cancer, heart disease, and stroke.   Family history of diabetes mellitus (DM) Orders: -     Hemoglobin A1c -     Lipid panel   Return in about 3 weeks (around 02/07/2015) for Nurse Visit-BP check and 3 mo PCP .     Chari Manning, NP-C [facility] [phone] 01/17/2015, 11:39 AM

## 2015-01-17 NOTE — Patient Instructions (Signed)
Hypertension Hypertension, commonly called high blood pressure, is when the force of blood pumping through your arteries is too strong. Your arteries are the blood vessels that carry blood from your heart throughout your body. A blood pressure reading consists of a higher number over a lower number, such as 110/72. The higher number (systolic) is the pressure inside your arteries when your heart pumps. The lower number (diastolic) is the pressure inside your arteries when your heart relaxes. Ideally you want your blood pressure below 120/80. Hypertension forces your heart to work harder to pump blood. Your arteries may become narrow or stiff. Having hypertension puts you at risk for heart disease, stroke, and other problems.  RISK FACTORS Some risk factors for high blood pressure are controllable. Others are not.  Risk factors you cannot control include:   Race. You may be at higher risk if you are African American.  Age. Risk increases with age.  Gender. Men are at higher risk than women before age 45 years. After age 65, women are at higher risk than men. Risk factors you can control include:  Not getting enough exercise or physical activity.  Being overweight.  Getting too much fat, sugar, calories, or salt in your diet.  Drinking too much alcohol. SIGNS AND SYMPTOMS Hypertension does not usually cause signs or symptoms. Extremely high blood pressure (hypertensive crisis) may cause headache, anxiety, shortness of breath, and nosebleed. DIAGNOSIS  To check if you have hypertension, your health care provider will measure your blood pressure while you are seated, with your arm held at the level of your heart. It should be measured at least twice using the same arm. Certain conditions can cause a difference in blood pressure between your right and left arms. A blood pressure reading that is higher than normal on one occasion does not mean that you need treatment. If one blood pressure reading  is high, ask your health care provider about having it checked again. TREATMENT  Treating high blood pressure includes making lifestyle changes and possibly taking medicine. Living a healthy lifestyle can help lower high blood pressure. You may need to change some of your habits. Lifestyle changes may include:  Following the DASH diet. This diet is high in fruits, vegetables, and whole grains. It is low in salt, red meat, and added sugars.  Getting at least 2 hours of brisk physical activity every week.  Losing weight if necessary.  Not smoking.  Limiting alcoholic beverages.  Learning ways to reduce stress. If lifestyle changes are not enough to get your blood pressure under control, your health care provider may prescribe medicine. You may need to take more than one. Work closely with your health care provider to understand the risks and benefits. HOME CARE INSTRUCTIONS  Have your blood pressure rechecked as directed by your health care provider.   Take medicines only as directed by your health care provider. Follow the directions carefully. Blood pressure medicines must be taken as prescribed. The medicine does not work as well when you skip doses. Skipping doses also puts you at risk for problems.   Do not smoke.   Monitor your blood pressure at home as directed by your health care provider. SEEK MEDICAL CARE IF:   You think you are having a reaction to medicines taken.  You have recurrent headaches or feel dizzy.  You have swelling in your ankles.  You have trouble with your vision. SEEK IMMEDIATE MEDICAL CARE IF:  You develop a severe headache or confusion.    You have unusual weakness, numbness, or feel faint.  You have severe chest or abdominal pain.  You vomit repeatedly.  You have trouble breathing. MAKE SURE YOU:   Understand these instructions.  Will watch your condition.  Will get help right away if you are not doing well or get worse. Document  Released: 10/06/2005 Document Revised: 02/20/2014 Document Reviewed: 07/29/2013 ExitCare Patient Information 2015 [employer]. This information is not intended to replace advice given to you by your health care provider. Make sure you discuss any questions you have with your health care provider. DASH Eating Plan DASH stands for "Dietary Approaches to Stop Hypertension." The DASH eating plan is a healthy eating plan that has been shown to reduce high blood pressure (hypertension). Additional health benefits may include reducing the risk of type 2 diabetes mellitus, heart disease, and stroke. The DASH eating plan may also help with weight loss. WHAT DO I NEED TO KNOW ABOUT THE DASH EATING PLAN? For the DASH eating plan, you will follow these general guidelines:  Choose foods with a percent daily value for sodium of less than 5% (as listed on the food label).  Use salt-free seasonings or herbs instead of table salt or sea salt.  Check with your health care provider or pharmacist before using salt substitutes.  Eat lower-sodium products, often labeled as "lower sodium" or "no salt added."  Eat fresh foods.  Eat more vegetables, fruits, and low-fat dairy products.  Choose whole grains. Look for the word "whole" as the first word in the ingredient list.  Choose fish and skinless chicken or turkey more often than red meat. Limit fish, poultry, and meat to 6 oz (170 g) each day.  Limit sweets, desserts, sugars, and sugary drinks.  Choose heart-healthy fats.  Limit cheese to 1 oz (28 g) per day.  Eat more home-cooked food and less restaurant, buffet, and fast food.  Limit fried foods.  Cook foods using methods other than frying.  Limit canned vegetables. If you do use them, rinse them well to decrease the sodium.  When eating at a restaurant, ask that your food be prepared with less salt, or no salt if possible. WHAT FOODS CAN I EAT? Seek help from a dietitian for individual  calorie needs. Grains Whole grain or whole wheat bread. Brown rice. Whole grain or whole wheat pasta. Quinoa, bulgur, and whole grain cereals. Low-sodium cereals. Corn or whole wheat flour tortillas. Whole grain cornbread. Whole grain crackers. Low-sodium crackers. Vegetables Fresh or frozen vegetables (raw, steamed, roasted, or grilled). Low-sodium or reduced-sodium tomato and vegetable juices. Low-sodium or reduced-sodium tomato sauce and paste. Low-sodium or reduced-sodium canned vegetables.  Fruits All fresh, canned (in natural juice), or frozen fruits. Meat and Other Protein Products Ground beef (85% or leaner), grass-fed beef, or beef trimmed of fat. Skinless chicken or turkey. Ground chicken or turkey. Pork trimmed of fat. All fish and seafood. Eggs. Dried beans, peas, or lentils. Unsalted nuts and seeds. Unsalted canned beans. Dairy Low-fat dairy products, such as skim or 1% milk, 2% or reduced-fat cheeses, low-fat ricotta or cottage cheese, or plain low-fat yogurt. Low-sodium or reduced-sodium cheeses. Fats and Oils Tub margarines without trans fats. Light or reduced-fat mayonnaise and salad dressings (reduced sodium). Avocado. Safflower, olive, or canola oils. Natural peanut or almond butter. Other Unsalted popcorn and pretzels. The items listed above may not be a complete list of recommended foods or beverages. Contact your dietitian for more options. WHAT FOODS ARE NOT RECOMMENDED? Grains White bread.   White pasta. White rice. Refined cornbread. Bagels and croissants. Crackers that contain trans fat. Vegetables Creamed or fried vegetables. Vegetables in a cheese sauce. Regular canned vegetables. Regular canned tomato sauce and paste. Regular tomato and vegetable juices. Fruits Dried fruits. Canned fruit in light or heavy syrup. Fruit juice. Meat and Other Protein Products Fatty cuts of meat. Ribs, chicken wings, bacon, sausage, bologna, salami, chitterlings, fatback, hot dogs,  bratwurst, and packaged luncheon meats. Salted nuts and seeds. Canned beans with salt. Dairy Whole or 2% milk, cream, half-and-half, and cream cheese. Whole-fat or sweetened yogurt. Full-fat cheeses or blue cheese. Nondairy creamers and whipped toppings. Processed cheese, cheese spreads, or cheese curds. Condiments Onion and garlic salt, seasoned salt, table salt, and sea salt. Canned and packaged gravies. Worcestershire sauce. Tartar sauce. Barbecue sauce. Teriyaki sauce. Soy sauce, including reduced sodium. Steak sauce. Fish sauce. Oyster sauce. Cocktail sauce. Horseradish. Ketchup and mustard. Meat flavorings and tenderizers. Bouillon cubes. Hot sauce. Tabasco sauce. Marinades. Taco seasonings. Relishes. Fats and Oils Butter, stick margarine, lard, shortening, ghee, and bacon fat. Coconut, palm kernel, or palm oils. Regular salad dressings. Other Pickles and olives. Salted popcorn and pretzels. The items listed above may not be a complete list of foods and beverages to avoid. Contact your dietitian for more information. WHERE CAN I FIND MORE INFORMATION? National Heart, Lung, and Blood Institute: www.nhlbi.nih.gov/health/health-topics/topics/dash/ Document Released: 09/25/2011 Document Revised: 02/20/2014 Document Reviewed: 08/10/2013 ExitCare Patient Information 2015 Exit[employer]. This information is not intended to replace advice given to you by your health care provider. Make sure you discuss any questions you have with your health care provider.  

## 2015-01-18 LAB — HEMOGLOBIN A1C
HEMOGLOBIN A1C: 5.8 % — AB (ref <5.7)
Mean Plasma Glucose: 120 mg/dL — ABNORMAL HIGH (ref <117)

## 2015-01-22 ENCOUNTER — Telehealth: Payer: Self-pay | Admitting: *Deleted

## 2015-01-22 NOTE — Telephone Encounter (Signed)
-----   Message from Lance Bosch, NP sent at 01/18/2015 11:19 PM EDT ----- Labs are normal except Cholesterol elevated. Please provide appropriate education regarding diet and exercise.  If she is unable to get her cholesterol under control in the next 6-12 months she may need to go on medication. Not diabetic

## 2015-01-22 NOTE — Telephone Encounter (Signed)
Pt is aware of her lab results.  

## 2015-01-31 ENCOUNTER — Other Ambulatory Visit: Payer: PRIVATE HEALTH INSURANCE | Admitting: Internal Medicine

## 2015-05-25 ENCOUNTER — Telehealth: Payer: Self-pay | Admitting: Internal Medicine

## 2015-05-25 NOTE — Telephone Encounter (Signed)
Pt is requesting a refill on hydrochlorothiazide (MICROZIDE) 12.5 MG capsule.

## 2015-05-31 ENCOUNTER — Other Ambulatory Visit (HOSPITAL_COMMUNITY)
Admission: RE | Admit: 2015-05-31 | Discharge: 2015-05-31 | Disposition: A | Discharge status: Home / Self Care | Payer: No Typology Code available for payment source | Source: Ambulatory Visit | Attending: Internal Medicine | Admitting: Internal Medicine

## 2015-05-31 ENCOUNTER — Ambulatory Visit: Payer: No Typology Code available for payment source | Attending: Internal Medicine | Admitting: Internal Medicine

## 2015-05-31 ENCOUNTER — Encounter: Payer: Self-pay | Admitting: Internal Medicine

## 2015-05-31 VITALS — BP 156/93 | HR 73 | Temp 98.0°F | Resp 16 | Ht 59.0 in | Wt 129.6 lb

## 2015-05-31 DIAGNOSIS — N760 Acute vaginitis: Secondary | ICD-10-CM | POA: Diagnosis present

## 2015-05-31 DIAGNOSIS — K219 Gastro-esophageal reflux disease without esophagitis: Secondary | ICD-10-CM | POA: Insufficient documentation

## 2015-05-31 DIAGNOSIS — F17200 Nicotine dependence, unspecified, uncomplicated: Secondary | ICD-10-CM | POA: Insufficient documentation

## 2015-05-31 DIAGNOSIS — Z124 Encounter for screening for malignant neoplasm of cervix: Secondary | ICD-10-CM

## 2015-05-31 DIAGNOSIS — Z79899 Other long term (current) drug therapy: Secondary | ICD-10-CM | POA: Insufficient documentation

## 2015-05-31 DIAGNOSIS — Z01419 Encounter for gynecological examination (general) (routine) without abnormal findings: Secondary | ICD-10-CM | POA: Insufficient documentation

## 2015-05-31 DIAGNOSIS — Z113 Encounter for screening for infections with a predominantly sexual mode of transmission: Secondary | ICD-10-CM | POA: Insufficient documentation

## 2015-05-31 DIAGNOSIS — Z1239 Encounter for other screening for malignant neoplasm of breast: Secondary | ICD-10-CM

## 2015-05-31 DIAGNOSIS — Z1151 Encounter for screening for human papillomavirus (HPV): Secondary | ICD-10-CM | POA: Diagnosis present

## 2015-05-31 DIAGNOSIS — I10 Essential (primary) hypertension: Secondary | ICD-10-CM | POA: Diagnosis not present

## 2015-05-31 LAB — BASIC METABOLIC PANEL
BUN: 11 mg/dL (ref 7–25)
CALCIUM: 9.8 mg/dL (ref 8.6–10.2)
CO2: 27 mmol/L (ref 20–31)
CREATININE: 0.96 mg/dL (ref 0.50–1.10)
Chloride: 105 mmol/L (ref 98–110)
GLUCOSE: 97 mg/dL (ref 65–99)
Potassium: 5.3 mmol/L (ref 3.5–5.3)
Sodium: 138 mmol/L (ref 135–146)

## 2015-05-31 MED ORDER — HYDROCHLOROTHIAZIDE 12.5 MG PO CAPS
12.5000 mg | ORAL_CAPSULE | Freq: Every day | ORAL | Status: DC
Start: 2015-05-31 — End: 2015-11-02

## 2015-05-31 NOTE — Progress Notes (Signed)
Patient here for follow up on her hypertension Patient presents with elevated blood pressure but Has been out of her medication for the past five days Patient also requesting to have her pap done today

## 2015-05-31 NOTE — Progress Notes (Signed)
Patient ID: Michelle Waters, female   DOB: Aug 08, 1971, 44 y.o.   MRN: [id number]  CC: HTN and pap  HPI: Michelle Waters is a 44 y.o. female here today for a follow up visit.  Patient has past medical history of hypertension. Patient reports that she has been out of her blood pressure pills for the past 5 days because she did not have transportation to the pharmacy. She denies symptoms of headache, chest pain, palpitations, edema, or blurred vision. Today she would also like to have a pap and breast exam completed. She denies symptoms of dysuria, vaginal discharge, itch, odor, or lesions. She has never had a mammogram before.   Allergies  Allergen Reactions  . Ampicillin Swelling and Rash  . Penicillins Swelling and Rash  . Codeine Itching  . Hydrocodone Itching   Past Medical History  Diagnosis Date  . Acid reflux    Current Outpatient Prescriptions on File Prior to Visit  Medication Sig Dispense Refill  . carbamide peroxide (DEBROX) 6.5 % otic solution Place 5 drops into the right ear 2 (two) times daily. (Patient not taking: Reported on 01/17/2015) 15 mL 0  . hydrochlorothiazide (MICROZIDE) 12.5 MG capsule Take 1 capsule (12.5 mg total) by mouth daily. 30 capsule 3   No current facility-administered medications on file prior to visit.   History reviewed. No pertinent family history. Social History   Social History  . Marital Status: Single    Spouse Name: N/A  . Number of Children: N/A  . Years of Education: N/A   Occupational History  . Not on file.   Social History Main Topics  . Smoking status: Current Every Day Smoker  . Smokeless tobacco: Not on file  . Alcohol Use: Yes  . Drug Use: No  . Sexual Activity: Not on file   Other Topics Concern  . Not on file   Social History Narrative    Review of Systems: Other than what is stated in HPI, all other systems are negative.   Objective:   Filed Vitals:   05/31/15 1156  BP: 156/93  Pulse: 73  Temp: 98 F (36.7 C)   Resp: 16    Physical Exam  Constitutional: She is oriented to person, place, and time.  Cardiovascular: Normal rate, regular rhythm and normal heart sounds.   Pulmonary/Chest: Effort normal and breath sounds normal. Right breast exhibits no mass. Left breast exhibits no mass.  Abdominal: Soft. Bowel sounds are normal. She exhibits no distension. There is no tenderness.  Genitourinary: Vagina normal and uterus normal. No breast tenderness or discharge. Cervix exhibits no motion tenderness, no discharge and no friability. Right adnexum displays no tenderness. Left adnexum displays no tenderness.  Musculoskeletal: She exhibits no edema.  Lymphadenopathy:       Right: No inguinal adenopathy present.       Left: No inguinal adenopathy present.  Neurological: She is alert and oriented to person, place, and time.  Skin: Skin is warm and dry. No rash noted. No erythema.     Lab Results  Component Value Date   WBC 6.5 01/17/2015   HGB 12.2 01/17/2015   HCT 37.6 01/17/2015   MCV 86.4 01/17/2015   PLT 300 01/17/2015   Lab Results  Component Value Date   CREATININE 0.90 01/17/2015   BUN 11 01/17/2015   NA 137 01/17/2015   K 5.0 01/17/2015   CL 106 01/17/2015   CO2 26 01/17/2015    Lab Results  Component Value Date  HGBA1C 5.8* 01/17/2015   Lipid Panel     Component Value Date/Time   CHOL 227* 01/17/2015 1155   TRIG 70 01/17/2015 1155   HDL 77 01/17/2015 1155   CHOLHDL 2.9 01/17/2015 1155   VLDL 14 01/17/2015 1155   LDLCALC 136* 01/17/2015 1155       Assessment and plan:   Kam was seen today for follow-up.  Diagnoses and all orders for this visit:  Papanicolaou smear -     Cytology - PAP  Breast cancer screening -     MM Digital Screening; Future  Essential hypertension -     hydrochlorothiazide (MICROZIDE) 12.5 MG capsule; Take 1 capsule (12.5 mg total) by mouth daily. -     Basic Metabolic Panel Medication refilled. I have asked patient to come back for  a BP recheck in 2 weeks, bus passes given so that she may RTC.  DASH diet advised  Return in about 3 weeks (around 06/21/2015) for Nurse Visit-BP check and 3 mo PCP HTN .        Lance Bosch, [facility] [phone] 05/31/2015, 12:27 PM

## 2015-06-01 ENCOUNTER — Other Ambulatory Visit: Payer: Self-pay | Admitting: Internal Medicine

## 2015-06-01 ENCOUNTER — Telehealth: Payer: Self-pay | Admitting: Internal Medicine

## 2015-06-01 DIAGNOSIS — N760 Acute vaginitis: Principal | ICD-10-CM

## 2015-06-01 DIAGNOSIS — B9689 Other specified bacterial agents as the cause of diseases classified elsewhere: Secondary | ICD-10-CM

## 2015-06-01 LAB — CERVICOVAGINAL ANCILLARY ONLY
Chlamydia: NEGATIVE
Neisseria Gonorrhea: NEGATIVE
WET PREP (BD AFFIRM): POSITIVE — AB

## 2015-06-01 LAB — CYTOLOGY - PAP

## 2015-06-01 MED ORDER — METRONIDAZOLE 0.75 % VA GEL: 1.0000 | Freq: Every day | VAGINAL | Status: DC

## 2015-06-01 NOTE — Telephone Encounter (Signed)
Called patient to inform her of Bacterial vaginosis. I have sent patient metrogel to use for 5 days. Advised of no alcohol use because it can make her feel sick. Patient aware and agrees to plan.

## 2015-06-04 ENCOUNTER — Telehealth: Payer: Self-pay | Admitting: Internal Medicine

## 2015-06-04 ENCOUNTER — Telehealth: Payer: Self-pay

## 2015-06-04 ENCOUNTER — Other Ambulatory Visit: Payer: PRIVATE HEALTH INSURANCE | Admitting: Internal Medicine

## 2015-06-04 NOTE — Telephone Encounter (Signed)
Accidentally routed note to [facility].

## 2015-06-04 NOTE — Telephone Encounter (Signed)
Patient called earlier today but we got disconnected and I couldn't grasp the reason of her call. From what I understood, she mentioned needing a refill on her hydrochlorothiazide (MICROZIDE) 12.5 MG capsule and a cream. But wanted to speak to a nurse regarding some mold In her apartment? And a cream prescription being $10 and I think she said that she couldn't afford it. Please follow up with patient for clarification. Thank you.

## 2015-06-04 NOTE — Telephone Encounter (Signed)
-----   Message from Lance Bosch, NP sent at 06/01/2015  9:02 AM EDT ----- Michelle Waters STD, still waiting on cytology

## 2015-06-04 NOTE — Telephone Encounter (Signed)
Patient is aware of her negative results for STD's

## 2015-06-05 ENCOUNTER — Telehealth: Payer: Self-pay

## 2015-06-05 NOTE — Telephone Encounter (Signed)
Nurse called patient, patient verified date of birth. Patient aware of normal cytology and agrees to repeat in 3 years. Patient has picked up hydrochlorothiazide and will come back Monday to pick up vaginal cream when husband gets paid.  Patient voices understanding and has no further questions at this time.

## 2015-06-05 NOTE — Telephone Encounter (Signed)
-----   Message from Lance Bosch, NP sent at 06/05/2015 11:33 AM EDT ----- Normal cytology. Repeat in 3 years.

## 2015-06-26 ENCOUNTER — Encounter: Payer: PRIVATE HEALTH INSURANCE | Admitting: Pharmacist

## 2015-06-28 ENCOUNTER — Encounter: Payer: PRIVATE HEALTH INSURANCE | Admitting: Pharmacist

## 2015-07-03 ENCOUNTER — Ambulatory Visit: Payer: PRIVATE HEALTH INSURANCE | Attending: Internal Medicine | Admitting: Pharmacist

## 2015-07-03 VITALS — BP 112/77 | HR 74

## 2015-07-03 DIAGNOSIS — I10 Essential (primary) hypertension: Secondary | ICD-10-CM | POA: Insufficient documentation

## 2015-07-03 DIAGNOSIS — Z720 Tobacco use: Secondary | ICD-10-CM | POA: Insufficient documentation

## 2015-07-03 NOTE — Progress Notes (Signed)
S:    Patient arrives in good spirits.    She presents to the clinic for blood pressure evaluation.   Patient reports adherence with medications.  Current BP Medications include:  Hydrochlorothiazide 12.5 mg daily   Patient reports that she checks her blood pressure at the store and that yesterday it was elevated but it may have been because she was "running around" the store.    O:   Last 3 Office BP readings: BP Readings from Last 3 Encounters:  07/03/15 112/77  05/31/15 156/93  01/17/15 142/99    BMET    Component Value Date/Time   NA 138 05/31/2015 1242   K 5.3 05/31/2015 1242   CL 105 05/31/2015 1242   CO2 27 05/31/2015 1242   GLUCOSE 97 05/31/2015 1242   BUN 11 05/31/2015 1242   CREATININE 0.96 05/31/2015 1242   CREATININE 1.00 09/04/2014 0922   CALCIUM 9.8 05/31/2015 1242   GFRNONAA 77 01/17/2015 1155   GFRNONAA >60 07/31/2007 0445   GFRAA 89 01/17/2015 1155   GFRAA  07/31/2007 0445    >60        The eGFR has been calculated using the MDRD equation. This calculation has not been validated in all clinical    A/P:  Hypertension: currently is controlled on hydrochlorothiazide 12.5 mg daily. No recommendations for any changes to medications. Recommended that patient report any blood pressure readings >140/90 and to come back to see me if she consistently sees elevated values. If it is elevated in the future, would increase the hydrochlorothiazide but I think her reported elevated readings are likely due to exertion at the store and are likely controlled otherwise based on today's reading.    Medication reconciliation completed and discontinued medications were removed from patient listed. Results reviewed and written information provided.   F/U Clinic Visit with Chari Manning, NP, as directed. Total time in face-to-face counseling 10 minutes.

## 2015-07-03 NOTE — Patient Instructions (Addendum)
Thank you for coming in to see me today!  Your blood pressure is great!!!  Keep taking hydrochlorothiazide 12.5 mg daily  Next visit with Chari Manning

## 2015-09-12 ENCOUNTER — Ambulatory Visit: Payer: PRIVATE HEALTH INSURANCE | Admitting: Internal Medicine

## 2015-10-17 ENCOUNTER — Telehealth: Payer: Self-pay

## 2015-10-17 NOTE — Telephone Encounter (Signed)
Patient called stated the HCTZ was making her hair fall out Patient has not been on her medication for the last three months Patient is requesting a prescription for a new medication

## 2015-10-19 ENCOUNTER — Telehealth: Payer: Self-pay

## 2015-10-19 NOTE — Telephone Encounter (Signed)
Called patient back and informed her she will need to make  An appointment to come in to see her provider

## 2015-10-19 NOTE — Telephone Encounter (Signed)
Tell her to make a appointment

## 2015-10-24 ENCOUNTER — Telehealth: Payer: Self-pay

## 2015-10-24 ENCOUNTER — Ambulatory Visit: Payer: PRIVATE HEALTH INSURANCE | Admitting: Internal Medicine

## 2015-10-24 ENCOUNTER — Telehealth: Payer: Self-pay | Admitting: Internal Medicine

## 2015-10-24 NOTE — Telephone Encounter (Signed)
Pt. Also needs her prescription for her asthma.Marland KitchenMarland KitchenMarland KitchenMarland Kitchenplease follow up

## 2015-10-24 NOTE — Telephone Encounter (Signed)
Pt complaining that her blood pressure medication is not working.  Pt has appointment at 62 today and I advised her to express this concern in her visit today with Dr. Feliciana Rossetti  Pt insisted that she wants it addressed before she comes in

## 2015-10-24 NOTE — Telephone Encounter (Signed)
Returned phone call to patient Patient re scheduled her appt to this upcoming Monday Patient will need to see the dr in reference to her blood pressure

## 2015-10-26 ENCOUNTER — Telehealth: Payer: Self-pay | Admitting: Internal Medicine

## 2015-10-26 NOTE — Telephone Encounter (Signed)
Returned patient phone call Patient is aware her provider will not prescribe anything until She is seen here in the office

## 2015-10-26 NOTE — Telephone Encounter (Signed)
Patient called requesting a refill on albuterol. She would like medicine sent to [facility] on [address]. PAtient would also like to know if she has to pay for medicine. Please follow up.

## 2015-10-29 ENCOUNTER — Ambulatory Visit: Payer: PRIVATE HEALTH INSURANCE | Admitting: Internal Medicine

## 2015-11-02 ENCOUNTER — Encounter: Payer: Self-pay | Admitting: Internal Medicine

## 2015-11-02 ENCOUNTER — Ambulatory Visit: Payer: PRIVATE HEALTH INSURANCE | Attending: Internal Medicine | Admitting: Internal Medicine

## 2015-11-02 VITALS — BP 123/87 | HR 97 | Temp 98.0°F | Resp 16 | Ht 59.0 in | Wt 127.0 lb

## 2015-11-02 DIAGNOSIS — I10 Essential (primary) hypertension: Secondary | ICD-10-CM | POA: Diagnosis not present

## 2015-11-02 DIAGNOSIS — Z79899 Other long term (current) drug therapy: Secondary | ICD-10-CM | POA: Diagnosis not present

## 2015-11-02 DIAGNOSIS — E785 Hyperlipidemia, unspecified: Secondary | ICD-10-CM

## 2015-11-02 DIAGNOSIS — F17200 Nicotine dependence, unspecified, uncomplicated: Secondary | ICD-10-CM

## 2015-11-02 DIAGNOSIS — K219 Gastro-esophageal reflux disease without esophagitis: Secondary | ICD-10-CM

## 2015-11-02 DIAGNOSIS — Z6825 Body mass index (BMI) 25.0-25.9, adult: Secondary | ICD-10-CM | POA: Insufficient documentation

## 2015-11-02 DIAGNOSIS — R1013 Epigastric pain: Secondary | ICD-10-CM | POA: Diagnosis not present

## 2015-11-02 DIAGNOSIS — R0602 Shortness of breath: Secondary | ICD-10-CM

## 2015-11-02 DIAGNOSIS — Z720 Tobacco use: Secondary | ICD-10-CM | POA: Insufficient documentation

## 2015-11-02 LAB — BASIC METABOLIC PANEL
BUN: 7 mg/dL (ref 7–25)
CO2: 25 mmol/L (ref 20–31)
Calcium: 10.1 mg/dL (ref 8.6–10.2)
Chloride: 98 mmol/L (ref 98–110)
Creat: 1 mg/dL (ref 0.50–1.10)
Glucose, Bld: 91 mg/dL (ref 65–99)
Potassium: 4.5 mmol/L (ref 3.5–5.3)
Sodium: 135 mmol/L (ref 135–146)

## 2015-11-02 LAB — LIPID PANEL
Cholesterol: 231 mg/dL — ABNORMAL HIGH (ref 125–200)
HDL: 115 mg/dL (ref >=46)
LDL Cholesterol: 96 mg/dL (ref <130)
TRIGLYCERIDES: 102 mg/dL (ref <150)
Total CHOL/HDL Ratio: 2 Ratio (ref <=5.0)
VLDL: 20 mg/dL (ref <30)

## 2015-11-02 MED ORDER — OMEPRAZOLE 20 MG PO CPDR: 20.0000 mg | DELAYED_RELEASE_CAPSULE | Freq: Every day | ORAL | Status: DC

## 2015-11-02 MED ORDER — IPRATROPIUM-ALBUTEROL 0.5-2.5 (3) MG/3ML IN SOLN: 3.0000 mL | Freq: Four times a day (QID) | RESPIRATORY_TRACT | Status: DC

## 2015-11-02 MED ORDER — LOSARTAN POTASSIUM 25 MG PO TABS
12.5000 mg | ORAL_TABLET | Freq: Every day | ORAL | Status: DC
Start: 2015-11-02 — End: 2016-08-08

## 2015-11-02 MED ORDER — IPRATROPIUM-ALBUTEROL 0.5-2.5 (3) MG/3ML IN SOLN: 3.0000 mL | Freq: Once | RESPIRATORY_TRACT | Status: DC

## 2015-11-02 MED ORDER — ALBUTEROL SULFATE HFA 108 (90 BASE) MCG/ACT IN AERS: 2.0000 | INHALATION_SPRAY | Freq: Four times a day (QID) | RESPIRATORY_TRACT | Status: DC | PRN

## 2015-11-02 MED FILL — ?OMEPRAZOLE DR 20 MG CAPSUL: 20 | 30 days supply | Qty: 30 | Fill #0

## 2015-11-02 MED FILL — VENTOLIN HFA 90 MCG INHALER: 108 (90 BAS | 30 days supply | Qty: 18 | Fill #0

## 2015-11-02 MED FILL — LOSARTAN POTASSIUM 25 MG TA: 25 | 30 days supply | Qty: 15 | Fill #0

## 2015-11-02 NOTE — Progress Notes (Signed)
Patient ID: Michelle Waters, female   DOB: 1971/04/21, 46 y.o.   MRN: [id number] Subjective:  Michelle Waters is a 45 y.o. female with hypertension. Patient reports that after being placed on hydrocholorthiazide several months ago she noticed hair loss in the back of her head. She stopped taking the medication 2 months ago and reports that she took her husbands HCTZ tablets and they did better with her hair loss. She is requesting medication changes today. She last took HCTZ tablets 2 days ago.  Been having acid reflux after meals in teh evening. Epigastric pain and burning that causes increased belching. Patient reports that she has been SOB with wheezing with activity. She is a pack per day smoker and has no plans to quit right now. Current Outpatient Prescriptions  Medication Sig Dispense Refill  . hydrochlorothiazide (MICROZIDE) 12.5 MG capsule Take 1 capsule (12.5 mg total) by mouth daily. 30 capsule 5   Current Facility-Administered Medications  Medication Dose Route Frequency Provider Last Rate Last Dose  . ipratropium-albuterol (DUONEB) 0.5-2.5 (3) MG/3ML nebulizer solution 3 mL  3 mL Nebulization Q6H Lance Bosch, NP        ROS: Other than what is stated in HPI, all other systems are negative.   Objective:  BP 123/87 mmHg  Pulse 97  Temp(Src) 98 F (36.7 C)  Resp 16  Ht 4\' 11"  (1.499 m)  Wt 127 lb (57.607 kg)  BMI 25.64 kg/m2  SpO2 98%  Appearance alert, well appearing, and in no distress, oriented to person, place, and time and normal appearing weight. General exam BP noted to be well controlled today in office, S1, S2 normal, no gallop, no murmur, wheezing and SOB when talking, no JVD, no HSM, no edema. Lungs clear on repeat exam after albuterol nebulizer given Lab review: most recent lipid panel reviewed, showing LDL result does not yet meet goal, orders written for new lab studies as appropriate; see orders.   Assessment:   [name] was seen today for follow-up.  Diagnoses  and all orders for this visit:  SOB (shortness of breath) -     ipratropium-albuterol (DUONEB) 0.5-2.5 (3) MG/3ML nebulizer solution 3 mL; Take 3 mLs by nebulization once in office -     Begin albuterol (PROVENTIL HFA;VENTOLIN HFA) 108 (90 Base) MCG/ACT inhaler; Inhale 2 puffs into the lungs every 6 (six) hours as needed for wheezing or shortness of breath. Smoking cessation advised   Essential hypertension -     losartan (COZAAR) 25 MG tablet; Take 0.5 tablets (12.5 mg total) by mouth daily. -     Basic metabolic panel BP looks good today with 2 days off medication. I will d/c HCTZ and switch her to very low dose Losartan.  Come back in 1 week for a BP check  HLD (hyperlipidemia) -     Lipid panel Last lipid panel was abnormal. She states that she has changed her diet. Will recheck today and call with results. Will add statin if cholesterol is elevated today.   Gastroesophageal reflux disease, esophagitis presence not specified -     Begin omeprazole (PRILOSEC) 20 MG capsule; Take 1 capsule (20 mg total) by mouth daily before breakfast. Wait 30 minutes before eating Discussed diet and weight with patient relating to acid reflux.  Went over things that may exacerbate acid reflux such as tomatoes, spicy foods, coffee, carbonated beverages, chocolates, etc.  Advised patient to avoid laying down at least two hours after meals and sleep with HOB elevated.  Tobacco use disorder Smoking cessation discussed for 3 minutes, patient is not willing to quit at this time. Will continue to assess on each visit. Discussed increased risk for diseases such as cancer, heart disease, and stroke.    Return in about 2 weeks (around 11/16/2015) for Nurse Visit-BP check and 3 mo PCP HTN.  Lance Bosch, NP 11/02/2015 3:59 PM

## 2015-11-02 NOTE — Progress Notes (Signed)
Patient here for follow up on her HTN Patient stated she has not been taking her medications because It was making her hair fall out Patient was having some SOB upon arrival duoneb given in office

## 2015-11-05 ENCOUNTER — Telehealth: Payer: Self-pay

## 2015-11-05 ENCOUNTER — Other Ambulatory Visit: Payer: Self-pay | Admitting: Internal Medicine

## 2015-11-05 DIAGNOSIS — E7800 Pure hypercholesterolemia, unspecified: Secondary | ICD-10-CM

## 2015-11-05 MED ORDER — ATORVASTATIN CALCIUM 10 MG PO TABS: 10.0000 mg | ORAL_TABLET | Freq: Every day | ORAL | Status: DC

## 2015-11-05 MED FILL — ?ATORVASTATIN 10 MG TABLET: 10 | 30 days supply | Qty: 30 | Fill #0

## 2015-11-05 NOTE — Telephone Encounter (Signed)
Spoke with patient and she aware of her lab results and to pick up Her cholesterol medication

## 2015-11-05 NOTE — Telephone Encounter (Signed)
-----   Message from Lance Bosch, NP sent at 11/05/2015  1:55 PM EST ----- Cholesterol is elevated, I would liek to place her on Atorvastatin 10 mg daily.

## 2015-11-21 ENCOUNTER — Other Ambulatory Visit: Payer: Self-pay | Admitting: Internal Medicine

## 2015-11-21 DIAGNOSIS — R0602 Shortness of breath: Secondary | ICD-10-CM

## 2015-11-21 MED ORDER — ALBUTEROL SULFATE HFA 108 (90 BASE) MCG/ACT IN AERS: 2.0000 | INHALATION_SPRAY | Freq: Four times a day (QID) | RESPIRATORY_TRACT | Status: DC | PRN

## 2015-11-30 MED FILL — ATORVASTATIN 10 MG TABLET: 10 | 30 days supply | Qty: 30 | Fill #1

## 2015-11-30 MED FILL — ?OMEPRAZOLE DR 20 MG CAPSUL: 20 | 30 days supply | Qty: 30 | Fill #1

## 2015-11-30 MED FILL — LOSARTAN POTASSIUM 25 MG TA: 25 | 30 days supply | Qty: 15 | Fill #1

## 2015-12-31 ENCOUNTER — Other Ambulatory Visit: Payer: Self-pay | Admitting: Internal Medicine

## 2015-12-31 DIAGNOSIS — Z1231 Encounter for screening mammogram for malignant neoplasm of breast: Secondary | ICD-10-CM

## 2016-01-09 MED FILL — LOSARTAN POTASSIUM 25 MG TA: 25 | 30 days supply | Qty: 15 | Fill #2

## 2016-01-09 MED FILL — OMEPRAZOLE DR 20 MG CAPSULE: 20 | 30 days supply | Qty: 30 | Fill #2

## 2016-01-11 ENCOUNTER — Ambulatory Visit: Payer: PRIVATE HEALTH INSURANCE

## 2016-01-21 ENCOUNTER — Ambulatory Visit: Payer: PRIVATE HEALTH INSURANCE | Admitting: Internal Medicine

## 2016-01-23 ENCOUNTER — Ambulatory Visit: Payer: PRIVATE HEALTH INSURANCE

## 2016-02-12 ENCOUNTER — Ambulatory Visit: Payer: PRIVATE HEALTH INSURANCE

## 2016-02-26 ENCOUNTER — Ambulatory Visit: Payer: PRIVATE HEALTH INSURANCE

## 2016-02-28 MED FILL — ?OMEPRAZOLE DR 20 MG CAPSUL: 20 | 30 days supply | Qty: 30 | Fill #3

## 2016-02-28 MED FILL — LOSARTAN POTASSIUM 25 MG TA: 25 | 30 days supply | Qty: 15 | Fill #3

## 2016-03-27 MED FILL — LOSARTAN POTASSIUM 25 MG TA: 25 | 30 days supply | Qty: 15 | Fill #4

## 2016-04-01 ENCOUNTER — Other Ambulatory Visit: Payer: Self-pay | Admitting: Internal Medicine

## 2016-04-01 MED FILL — ?OMEPRAZOLE DR 20 MG CAPSUL: 20 | 30 days supply | Qty: 30 | Fill #0

## 2016-04-28 MED FILL — ?OMEPRAZOLE DR 20 MG CAPSUL: 20 | 30 days supply | Qty: 30 | Fill #1

## 2016-04-28 MED FILL — LOSARTAN POTASSIUM 25 MG TA: 25 | 30 days supply | Qty: 15 | Fill #5

## 2016-06-05 MED FILL — ?OMEPRAZOLE DR 20 MG CAPSUL: 20 | 30 days supply | Qty: 30 | Fill #2

## 2016-06-05 MED FILL — LOSARTAN POTASSIUM 25 MG TA: 25 | 30 days supply | Qty: 15 | Fill #6

## 2016-06-30 ENCOUNTER — Other Ambulatory Visit: Payer: Self-pay | Admitting: Internal Medicine

## 2016-06-30 MED FILL — LOSARTAN POTASSIUM 25 MG TA: 25 | 30 days supply | Qty: 15 | Fill #7

## 2016-07-07 MED FILL — ?OMEPRAZOLE DR 20 MG CAPSUL: 20 | 30 days supply | Qty: 30 | Fill #0

## 2016-07-07 MED FILL — ?ATORVASTATIN 10 MG TABLET: 10 | 30 days supply | Qty: 30 | Fill #2

## 2016-08-06 ENCOUNTER — Other Ambulatory Visit: Payer: Self-pay | Admitting: Internal Medicine

## 2016-08-06 DIAGNOSIS — I10 Essential (primary) hypertension: Secondary | ICD-10-CM

## 2016-08-08 ENCOUNTER — Other Ambulatory Visit: Payer: Self-pay | Admitting: Internal Medicine

## 2016-08-08 DIAGNOSIS — I10 Essential (primary) hypertension: Secondary | ICD-10-CM

## 2016-08-11 ENCOUNTER — Other Ambulatory Visit: Payer: Self-pay | Admitting: Internal Medicine

## 2016-08-11 MED FILL — LOSARTAN POTASSIUM 25 MG TA: 25 | 30 days supply | Qty: 15 | Fill #0

## 2016-08-13 ENCOUNTER — Other Ambulatory Visit: Payer: Self-pay | Admitting: Internal Medicine

## 2016-08-13 MED FILL — OMEPRAZOLE DR 20 MG CAPSULE: 20 | 30 days supply | Qty: 30 | Fill #0

## 2016-09-10 MED FILL — LOSARTAN POTASSIUM 25 MG TA: 25 | 30 days supply | Qty: 15 | Fill #1

## 2016-09-17 ENCOUNTER — Other Ambulatory Visit: Payer: Self-pay | Admitting: Internal Medicine

## 2016-10-10 ENCOUNTER — Other Ambulatory Visit: Payer: Self-pay | Admitting: Internal Medicine

## 2016-10-10 DIAGNOSIS — I10 Essential (primary) hypertension: Secondary | ICD-10-CM

## 2016-10-29 ENCOUNTER — Other Ambulatory Visit: Payer: Self-pay | Admitting: Internal Medicine

## 2016-10-29 DIAGNOSIS — I10 Essential (primary) hypertension: Secondary | ICD-10-CM

## 2016-12-18 ENCOUNTER — Other Ambulatory Visit: Payer: Self-pay | Admitting: Internal Medicine

## 2016-12-18 DIAGNOSIS — I10 Essential (primary) hypertension: Secondary | ICD-10-CM

## 2017-08-30 ENCOUNTER — Encounter (HOSPITAL_COMMUNITY)
Admission: EM | Disposition: A | Discharge status: Home / Self Care | Payer: Self-pay | Source: Home / Self Care | Attending: Orthopedic Surgery

## 2017-08-30 ENCOUNTER — Inpatient Hospital Stay (HOSPITAL_COMMUNITY): Payer: Self-pay | Admitting: Certified Registered Nurse Anesthetist

## 2017-08-30 ENCOUNTER — Inpatient Hospital Stay (HOSPITAL_COMMUNITY)
Admission: EM | Admit: 2017-08-30 | Discharge: 2017-09-01 | DRG: 603 | Disposition: A | Discharge status: Home / Self Care | Payer: Self-pay | Attending: Orthopedic Surgery | Admitting: Orthopedic Surgery

## 2017-08-30 ENCOUNTER — Other Ambulatory Visit: Payer: Self-pay

## 2017-08-30 ENCOUNTER — Encounter (HOSPITAL_COMMUNITY): Payer: Self-pay

## 2017-08-30 ENCOUNTER — Inpatient Hospital Stay (HOSPITAL_COMMUNITY): Payer: Self-pay

## 2017-08-30 DIAGNOSIS — K219 Gastro-esophageal reflux disease without esophagitis: Secondary | ICD-10-CM | POA: Diagnosis present

## 2017-08-30 DIAGNOSIS — R0602 Shortness of breath: Secondary | ICD-10-CM

## 2017-08-30 DIAGNOSIS — F17210 Nicotine dependence, cigarettes, uncomplicated: Secondary | ICD-10-CM | POA: Diagnosis present

## 2017-08-30 DIAGNOSIS — L03011 Cellulitis of right finger: Principal | ICD-10-CM | POA: Diagnosis present

## 2017-08-30 DIAGNOSIS — L089 Local infection of the skin and subcutaneous tissue, unspecified: Secondary | ICD-10-CM

## 2017-08-30 DIAGNOSIS — W458XXA Other foreign body or object entering through skin, initial encounter: Secondary | ICD-10-CM | POA: Diagnosis present

## 2017-08-30 DIAGNOSIS — Z833 Family history of diabetes mellitus: Secondary | ICD-10-CM

## 2017-08-30 DIAGNOSIS — I10 Essential (primary) hypertension: Secondary | ICD-10-CM | POA: Diagnosis present

## 2017-08-30 DIAGNOSIS — S60450A Superficial foreign body of right index finger, initial encounter: Secondary | ICD-10-CM | POA: Diagnosis present

## 2017-08-30 DIAGNOSIS — B999 Unspecified infectious disease: Secondary | ICD-10-CM

## 2017-08-30 DIAGNOSIS — B9689 Other specified bacterial agents as the cause of diseases classified elsewhere: Secondary | ICD-10-CM | POA: Diagnosis present

## 2017-08-30 DIAGNOSIS — A499 Bacterial infection, unspecified: Secondary | ICD-10-CM | POA: Diagnosis present

## 2017-08-30 HISTORY — PX: I&D EXTREMITY: SHX5045

## 2017-08-30 HISTORY — DX: Essential (primary) hypertension: I10

## 2017-08-30 LAB — BASIC METABOLIC PANEL
ANION GAP: 7 (ref 5–15)
BUN: <5 mg/dL — ABNORMAL LOW (ref 6–20)
CALCIUM: 8.9 mg/dL (ref 8.9–10.3)
CO2: 24 mmol/L (ref 22–32)
Chloride: 109 mmol/L (ref 101–111)
Creatinine, Ser: 0.87 mg/dL (ref 0.44–1.00)
Glucose, Bld: 86 mg/dL (ref 65–99)
Potassium: 3.5 mmol/L (ref 3.5–5.1)
SODIUM: 140 mmol/L (ref 135–145)

## 2017-08-30 LAB — CBC WITH DIFFERENTIAL/PLATELET
BASOS ABS: 0 10*3/uL (ref 0.0–0.1)
BASOS PCT: 0 %
Eosinophils Absolute: 0 10*3/uL (ref 0.0–0.7)
Eosinophils Relative: 0 %
HEMATOCRIT: 35.3 % — AB (ref 36.0–46.0)
HEMOGLOBIN: 11.8 g/dL — AB (ref 12.0–15.0)
Lymphocytes Relative: 45 %
Lymphs Abs: 2.2 10*3/uL (ref 0.7–4.0)
MCH: 28.9 pg (ref 26.0–34.0)
MCHC: 33.4 g/dL (ref 30.0–36.0)
MCV: 86.3 fL (ref 78.0–100.0)
Monocytes Absolute: 0.3 10*3/uL (ref 0.1–1.0)
Monocytes Relative: 6 %
NEUTROS ABS: 2.4 10*3/uL (ref 1.7–7.7)
NEUTROS PCT: 49 %
Platelets: 285 10*3/uL (ref 150–400)
RBC: 4.09 MIL/uL (ref 3.87–5.11)
RDW: 13 % (ref 11.5–15.5)
WBC: 4.8 10*3/uL (ref 4.0–10.5)

## 2017-08-30 SURGERY — IRRIGATION AND DEBRIDEMENT EXTREMITY
Anesthesia: General | Site: Index Finger | Laterality: Right

## 2017-08-30 MED ORDER — ONDANSETRON 4 MG PO TBDP: 4.0000 mg | ORAL_TABLET | Freq: Four times a day (QID) | ORAL | Status: DC | PRN

## 2017-08-30 MED ORDER — PROPOFOL 10 MG/ML IV BOLUS
INTRAVENOUS | Status: DC | PRN
  Administered 2017-08-30: 150 mg via INTRAVENOUS

## 2017-08-30 MED ORDER — LIDOCAINE 2% (20 MG/ML) 5 ML SYRINGE
INTRAMUSCULAR | Status: AC
  Filled 2017-08-30: qty 5

## 2017-08-30 MED ORDER — SCOPOLAMINE 1 MG/3DAYS TD PT72
MEDICATED_PATCH | TRANSDERMAL | Status: AC
Start: 2017-08-30 — End: ?
  Filled 2017-08-30: qty 1

## 2017-08-30 MED ORDER — ALBUTEROL SULFATE (2.5 MG/3ML) 0.083% IN NEBU: 3.0000 mL | INHALATION_SOLUTION | Freq: Four times a day (QID) | RESPIRATORY_TRACT | Status: DC | PRN

## 2017-08-30 MED ORDER — MIDAZOLAM HCL 2 MG/2ML IJ SOLN
INTRAMUSCULAR | Status: AC
  Filled 2017-08-30: qty 2

## 2017-08-30 MED ORDER — PANTOPRAZOLE SODIUM 40 MG PO TBEC
40.0000 mg | DELAYED_RELEASE_TABLET | Freq: Every day | ORAL | Status: DC
  Administered 2017-08-31 – 2017-09-01 (×2): 40 mg via ORAL
  Filled 2017-08-30 (×2): qty 1

## 2017-08-30 MED ORDER — ONDANSETRON HCL 4 MG/2ML IJ SOLN
INTRAMUSCULAR | Status: DC | PRN
  Administered 2017-08-30: 4 mg via INTRAVENOUS

## 2017-08-30 MED ORDER — SCOPOLAMINE 1 MG/3DAYS TD PT72
MEDICATED_PATCH | TRANSDERMAL | Status: DC | PRN
  Administered 2017-08-30: 1 via TRANSDERMAL

## 2017-08-30 MED ORDER — ONDANSETRON HCL 4 MG/2ML IJ SOLN: INTRAMUSCULAR | Status: DC | PRN

## 2017-08-30 MED ORDER — FENTANYL CITRATE (PF) 100 MCG/2ML IJ SOLN
INTRAMUSCULAR | Status: DC | PRN
  Administered 2017-08-30 (×2): 25 ug via INTRAVENOUS
  Administered 2017-08-30 (×2): 50 ug via INTRAVENOUS

## 2017-08-30 MED ORDER — IPRATROPIUM-ALBUTEROL 0.5-2.5 (3) MG/3ML IN SOLN: 3.0000 mL | Freq: Once | RESPIRATORY_TRACT | Status: DC

## 2017-08-30 MED ORDER — LIDOCAINE HCL (CARDIAC) 20 MG/ML IV SOLN
INTRAVENOUS | Status: DC | PRN
  Administered 2017-08-30: 80 mg via INTRAVENOUS

## 2017-08-30 MED ORDER — ONDANSETRON HCL 4 MG/2ML IJ SOLN
4.0000 mg | Freq: Four times a day (QID) | INTRAMUSCULAR | Status: DC | PRN
  Administered 2017-08-30: 4 mg via INTRAVENOUS
  Filled 2017-08-30: qty 2

## 2017-08-30 MED ORDER — MIDAZOLAM HCL 5 MG/5ML IJ SOLN
INTRAMUSCULAR | Status: DC | PRN
  Administered 2017-08-30: 2 mg via INTRAVENOUS

## 2017-08-30 MED ORDER — PROMETHAZINE HCL 25 MG/ML IJ SOLN: 6.2500 mg | INTRAMUSCULAR | Status: DC | PRN

## 2017-08-30 MED ORDER — FENTANYL CITRATE (PF) 100 MCG/2ML IJ SOLN
INTRAMUSCULAR | Status: AC
  Filled 2017-08-30: qty 2

## 2017-08-30 MED ORDER — OXYCODONE-ACETAMINOPHEN 5-325 MG PO TABS
1.0000 | ORAL_TABLET | ORAL | Status: DC | PRN
  Administered 2017-08-30 – 2017-09-01 (×8): 2 via ORAL
  Filled 2017-08-30 (×7): qty 2

## 2017-08-30 MED ORDER — FENTANYL CITRATE (PF) 250 MCG/5ML IJ SOLN
INTRAMUSCULAR | Status: AC
  Filled 2017-08-30: qty 5

## 2017-08-30 MED ORDER — ATORVASTATIN CALCIUM 10 MG PO TABS
10.0000 mg | ORAL_TABLET | Freq: Every day | ORAL | Status: DC
  Administered 2017-08-31: 10 mg via ORAL
  Filled 2017-08-30: qty 1

## 2017-08-30 MED ORDER — OXYCODONE-ACETAMINOPHEN 5-325 MG PO TABS
ORAL_TABLET | ORAL | Status: AC
  Filled 2017-08-30: qty 2

## 2017-08-30 MED ORDER — CLINDAMYCIN PHOSPHATE 600 MG/50ML IV SOLN
INTRAVENOUS | Status: DC | PRN
  Administered 2017-08-30: 600 mg via INTRAVENOUS

## 2017-08-30 MED ORDER — CLINDAMYCIN PHOSPHATE 600 MG/50ML IV SOLN
INTRAVENOUS | Status: AC
  Filled 2017-08-30: qty 50

## 2017-08-30 MED ORDER — CLINDAMYCIN PHOSPHATE 600 MG/50ML IV SOLN
600.0000 mg | Freq: Four times a day (QID) | INTRAVENOUS | Status: DC
  Administered 2017-08-30 – 2017-09-01 (×9): 600 mg via INTRAVENOUS
  Filled 2017-08-30 (×11): qty 50

## 2017-08-30 MED ORDER — PROPOFOL 10 MG/ML IV BOLUS
INTRAVENOUS | Status: AC
  Filled 2017-08-30: qty 20

## 2017-08-30 MED ORDER — CLINDAMYCIN PHOSPHATE 900 MG/50ML IV SOLN
900.0000 mg | INTRAVENOUS | Status: DC
  Filled 2017-08-30: qty 50

## 2017-08-30 MED ORDER — MORPHINE SULFATE (PF) 4 MG/ML IV SOLN
4.0000 mg | INTRAVENOUS | Status: DC | PRN
  Administered 2017-08-31 (×2): 4 mg via INTRAVENOUS
  Filled 2017-08-30 (×2): qty 1

## 2017-08-30 MED ORDER — LOSARTAN POTASSIUM 25 MG PO TABS
12.5000 mg | ORAL_TABLET | Freq: Every day | ORAL | Status: DC
  Administered 2017-08-31 – 2017-09-01 (×2): 12.5 mg via ORAL
  Filled 2017-08-30 (×2): qty 0.5

## 2017-08-30 MED ORDER — CHLORHEXIDINE GLUCONATE 4 % EX LIQD: 60.0000 mL | Freq: Once | CUTANEOUS | Status: AC

## 2017-08-30 MED ORDER — SODIUM CHLORIDE 0.9 % IR SOLN
Status: DC | PRN
  Administered 2017-08-30: 6000 mL

## 2017-08-30 MED ORDER — SODIUM CHLORIDE 0.9 % IV SOLN
INTRAVENOUS | Status: DC
  Administered 2017-08-30 – 2017-09-01 (×3): via INTRAVENOUS

## 2017-08-30 MED ORDER — ONDANSETRON HCL 4 MG/2ML IJ SOLN
INTRAMUSCULAR | Status: AC
  Filled 2017-08-30: qty 2

## 2017-08-30 MED ORDER — LIDOCAINE HCL (PF) 1 % IJ SOLN: 10.0000 mL | Freq: Once | INTRAMUSCULAR | Status: DC

## 2017-08-30 MED ORDER — DEXAMETHASONE SODIUM PHOSPHATE 10 MG/ML IJ SOLN
INTRAMUSCULAR | Status: AC
  Filled 2017-08-30: qty 1

## 2017-08-30 MED ORDER — 0.9 % SODIUM CHLORIDE (POUR BTL) OPTIME
TOPICAL | Status: DC | PRN
  Administered 2017-08-30: 1000 mL

## 2017-08-30 MED ORDER — FENTANYL CITRATE (PF) 100 MCG/2ML IJ SOLN
25.0000 ug | INTRAMUSCULAR | Status: DC | PRN
  Administered 2017-08-30 (×2): 50 ug via INTRAVENOUS

## 2017-08-30 MED ORDER — DEXAMETHASONE SODIUM PHOSPHATE 10 MG/ML IJ SOLN
INTRAMUSCULAR | Status: DC | PRN
  Administered 2017-08-30: 10 mg via INTRAVENOUS

## 2017-08-30 MED ORDER — LACTATED RINGERS IV SOLN
INTRAVENOUS | Status: DC | PRN
  Administered 2017-08-30: 11:00:00 via INTRAVENOUS

## 2017-08-30 SURGICAL SUPPLY — 59 items
BANDAGE ACE 4X5 VEL STRL LF (GAUZE/BANDAGES/DRESSINGS) IMPLANT
BANDAGE ELASTIC 3 VELCRO ST LF (GAUZE/BANDAGES/DRESSINGS) ×3 IMPLANT
BNDG COHESIVE 4X5 TAN STRL (GAUZE/BANDAGES/DRESSINGS) IMPLANT
BNDG GAUZE ELAST 4 BULKY (GAUZE/BANDAGES/DRESSINGS) ×3 IMPLANT
BRUSH SCRUB SURG 4.25 DISP (MISCELLANEOUS) ×3 IMPLANT
COVER SURGICAL LIGHT HANDLE (MISCELLANEOUS) ×3 IMPLANT
CUFF TOURNIQUET SINGLE 18IN (TOURNIQUET CUFF) ×3 IMPLANT
CUFF TOURNIQUET SINGLE 24IN (TOURNIQUET CUFF) IMPLANT
CUFF TOURNIQUET SINGLE 34IN LL (TOURNIQUET CUFF) IMPLANT
DRAIN PENROSE 18X1/4 LTX STRL (WOUND CARE) ×3 IMPLANT
DRAPE IMP U-DRAPE 54X76 (DRAPES) IMPLANT
DRAPE U-SHAPE 47X51 STRL (DRAPES) IMPLANT
DRSG ADAPTIC 3X8 NADH LF (GAUZE/BANDAGES/DRESSINGS) IMPLANT
DRSG PAD ABDOMINAL 8X10 ST (GAUZE/BANDAGES/DRESSINGS) IMPLANT
DURAPREP 26ML APPLICATOR (WOUND CARE) IMPLANT
ELECT REM PT RETURN 9FT ADLT (ELECTROSURGICAL) ×3
ELECTRODE REM PT RTRN 9FT ADLT (ELECTROSURGICAL) ×1 IMPLANT
FACESHIELD WRAPAROUND (MASK) ×3 IMPLANT
GAUZE SPONGE 4X4 12PLY STRL (GAUZE/BANDAGES/DRESSINGS) IMPLANT
GAUZE SPONGE 4X4 12PLY STRL LF (GAUZE/BANDAGES/DRESSINGS) ×3 IMPLANT
GLOVE BIOGEL PI ORTHO PRO 7.5 (GLOVE)
GLOVE BIOGEL PI ORTHO PRO SZ8 (GLOVE) ×2
GLOVE ORTHO TXT STRL SZ7.5 (GLOVE) IMPLANT
GLOVE PI ORTHO PRO STRL 7.5 (GLOVE) IMPLANT
GLOVE PI ORTHO PRO STRL SZ8 (GLOVE) ×1 IMPLANT
GLOVE SURG ORTHO 8.5 STRL (GLOVE) ×3 IMPLANT
GOWN STRL REUS W/ TWL LRG LVL3 (GOWN DISPOSABLE) ×1 IMPLANT
GOWN STRL REUS W/ TWL XL LVL3 (GOWN DISPOSABLE) ×1 IMPLANT
GOWN STRL REUS W/TWL LRG LVL3 (GOWN DISPOSABLE) ×3
GOWN STRL REUS W/TWL XL LVL3 (GOWN DISPOSABLE) ×2
HANDPIECE INTERPULSE COAX TIP (DISPOSABLE)
KIT BASIN OR (CUSTOM PROCEDURE TRAY) ×3 IMPLANT
KIT ROOM TURNOVER OR (KITS) ×3 IMPLANT
MANIFOLD NEPTUNE II (INSTRUMENTS) ×3 IMPLANT
NEEDLE 22X1 1/2 (OR ONLY) (NEEDLE) ×3 IMPLANT
NS IRRIG 1000ML POUR BTL (IV SOLUTION) ×3 IMPLANT
PACK ORTHO EXTREMITY (CUSTOM PROCEDURE TRAY) ×3 IMPLANT
PAD ARMBOARD 7.5X6 YLW CONV (MISCELLANEOUS) ×6 IMPLANT
PENCIL BUTTON HOLSTER BLD 10FT (ELECTRODE) IMPLANT
SET CYSTO W/LG BORE CLAMP LF (SET/KITS/TRAYS/PACK) ×3 IMPLANT
SET HNDPC FAN SPRY TIP SCT (DISPOSABLE) IMPLANT
SPONGE LAP 18X18 X RAY DECT (DISPOSABLE) IMPLANT
SPONGE LAP 4X18 X RAY DECT (DISPOSABLE) IMPLANT
STOCKINETTE IMPERVIOUS 9X36 MD (GAUZE/BANDAGES/DRESSINGS) IMPLANT
SUT ETHILON 2 0 FS 18 (SUTURE) IMPLANT
SUT ETHILON 3 0 PS 1 (SUTURE) ×3 IMPLANT
SUT VIC AB 2-0 CT1 27 (SUTURE)
SUT VIC AB 2-0 CT1 TAPERPNT 27 (SUTURE) IMPLANT
SWAB COLLECTION DEVICE MRSA (MISCELLANEOUS) ×3 IMPLANT
SWAB CULTURE ESWAB REG 1ML (MISCELLANEOUS) ×3 IMPLANT
SYR 5ML LL (SYRINGE) ×3 IMPLANT
SYRINGE 20CC LL (MISCELLANEOUS) ×3 IMPLANT
TOWEL OR 17X24 6PK STRL BLUE (TOWEL DISPOSABLE) IMPLANT
TOWEL OR 17X26 10 PK STRL BLUE (TOWEL DISPOSABLE) ×3 IMPLANT
TUBE CONNECTING 12'X1/4 (SUCTIONS) ×1
TUBE CONNECTING 12X1/4 (SUCTIONS) ×2 IMPLANT
UNDERPAD 30X30 (UNDERPADS AND DIAPERS) ×6 IMPLANT
WATER STERILE IRR 1000ML POUR (IV SOLUTION) ×3 IMPLANT
YANKAUER SUCT BULB TIP NO VENT (SUCTIONS) ×3 IMPLANT

## 2017-08-30 NOTE — ED Triage Notes (Signed)
Pt coming from home. Pt had a splinter in the right index finger that was taken out but finger gradually swollen over the past 3 weeks and pain has become worse. Pt had is swollen as well manly in the right index finger.

## 2017-08-30 NOTE — ED Provider Notes (Signed)
[facility] EMERGENCY DEPARTMENT Provider Note   CSN: [id number] Arrival date & time: 08/30/17  4098     History   Chief Complaint Chief Complaint  Patient presents with  . Finger Injury    HPI Michelle Waters is a 46 y.o. female who presents with R finger infection. The patient states that 3 weeks ago she got a splinter in her right index finger.  She states that she thought she took it out however has had swelling redness and pain developed over the past 3 weeks.  She states that it was worse however seems to have improved.  The pain was extending over the dorsal surface of her knuckles however has decreased in its area and is now localized over her PIP, proximal phalanx and MCP of the right index finger.  She states "I think it might be broken."  She is unable to use the right hand because of her pain.  She is right-hand dominant.  She is unsure of her last tetanus vaccination.  HPI  Past Medical History:  Diagnosis Date  . Acid reflux     Patient Active Problem List   Diagnosis Date Noted  . Bacterial infection of finger or toe 08/30/2017  . Smoker 01/17/2015  . Family history of diabetes mellitus (DM) 01/17/2015  . Essential hypertension 01/17/2015    History reviewed. No pertinent surgical history.  OB History    No data available       Home Medications    Prior to Admission medications   Medication Sig Start Date End Date Taking? Authorizing Provider  albuterol (PROVENTIL HFA;VENTOLIN HFA) 108 (90 Base) MCG/ACT inhaler Inhale 2 puffs into the lungs every 6 (six) hours as needed for wheezing or shortness of breath. 11/21/15  Yes Tresa Garter, MD  atorvastatin (LIPITOR) 10 MG tablet Take 1 tablet (10 mg total) by mouth daily at 6 PM. 11/05/15  Yes Lance Bosch, NP  losartan (COZAAR) 25 MG tablet TAKE 1/2 TABLET BY MOUTH DAILY. 08/11/16  Yes Jegede, Olugbemiga E, MD  omeprazole (PRILOSEC) 20 MG capsule TAKE 1 CAPSULE BY MOUTH DAILY  BEFORE BREAKFAST. WAIT 30 MINUTES BEFORE EATING 08/13/16  Yes Tresa Garter, MD    Family History History reviewed. No pertinent family history.  Social History Social History   Tobacco Use  . Smoking status: Current Every Day Smoker    Packs/day: 0.50  . Smokeless tobacco: Never Used  Substance Use Topics  . Alcohol use: Yes  . Drug use: No     Allergies   Ampicillin; Penicillins; Codeine; and Hydrocodone   Review of Systems Review of Systems Ten systems reviewed and are negative for acute change, except as noted in the HPI.    Physical Exam Updated Vital Signs BP 127/60   Pulse 88   Temp 98.5 F (36.9 C) (Oral)   Resp 16   SpO2 100%   Physical Exam  Constitutional: She is oriented to person, place, and time. She appears well-developed and well-nourished. No distress.  Smells strongly of cigarettes Appears older than states age  HENT:  Head: Normocephalic and atraumatic.  Eyes: Conjunctivae are normal. No scleral icterus.  Neck: Normal range of motion.  Cardiovascular: Normal rate, regular rhythm and normal heart sounds. Exam reveals no gallop and no friction rub.  No murmur heard. Pulmonary/Chest: Effort normal and breath sounds normal. No respiratory distress.  Abdominal: Soft. Bowel sounds are normal. She exhibits no distension and no mass. There is no tenderness.  There is no guarding.  Musculoskeletal:  Erythema and swelling over the extensor surface of the right index finger extending past the MCP joint.  No streaking.  Exquisitely tender to Palpation, tense and fluctuant. Able to move joints with pain  Neurological: She is alert and oriented to person, place, and time.  Skin: Skin is warm and dry. She is not diaphoretic.  Psychiatric: Her behavior is normal.  Nursing note and vitals reviewed.            ED Treatments / Results  Labs (all labs ordered are listed, but only abnormal results are displayed) Labs Reviewed  HIV ANTIBODY  (ROUTINE TESTING)  BASIC METABOLIC PANEL  CBC WITH DIFFERENTIAL/PLATELET    EKG  EKG Interpretation None       Radiology No results found.  Procedures Procedures (including critical care time)  Medications Ordered in ED Medications  ipratropium-albuterol (DUONEB) 0.5-2.5 (3) MG/3ML nebulizer solution 3 mL (not administered)  atorvastatin (LIPITOR) tablet 10 mg (not administered)  albuterol (PROVENTIL HFA;VENTOLIN HFA) 108 (90 Base) MCG/ACT inhaler 2 puff (not administered)  losartan (COZAAR) tablet 12.5 mg (not administered)  pantoprazole (PROTONIX) EC tablet 40 mg (not administered)  0.9 %  sodium chloride infusion (not administered)  morphine 4 MG/ML injection 4 mg (not administered)  ondansetron (ZOFRAN-ODT) disintegrating tablet 4 mg (not administered)    Or  ondansetron (ZOFRAN) injection 4 mg (not administered)  lidocaine (PF) (XYLOCAINE) 1 % injection 10 mL (not administered)     Initial Impression / Assessment and Plan / ED Course  I have reviewed the triage vital signs and the nursing notes.  Pertinent labs & imaging results that were available during my care of the patient were reviewed by me and considered in my medical decision making (see chart for details).  Clinical Course as of Aug 31 851  Sun Aug 30, 2017  0827 Spoke with Dr. Veverly Fells who is on call for Orthopedics. He will see the patient as we have no hand coverage.   [AH]  [id number] Patient seen in ED by PA Dixon. Patient will be admitted for Surgical procedure?  Septic joint.   [AH]    Clinical Course User Index [AH] Margarita Mail, PA-C     Final Clinical Impressions(s) / ED Diagnoses   Final diagnoses:  SOB (shortness of breath)  Infection    ED Discharge Orders    None       Margarita Mail, PA-C 08/30/17 0037    Gareth Morgan, MD 09/06/17 1737

## 2017-08-30 NOTE — Transfer of Care (Signed)
Immediate Anesthesia Transfer of Care Note  Patient: Michelle Waters  Procedure(s) Performed: IRRIGATION AND DEBRIDEMENT INDEX FINGER (Right Index Finger)  Patient Location: PACU  Anesthesia Type:General  Level of Consciousness: awake and alert   Airway & Oxygen Therapy: Patient Spontanous Breathing and Patient connected to nasal cannula oxygen  Post-op Assessment: Report given to RN, Post -op Vital signs reviewed and stable and Patient moving all extremities X 4  Post vital signs: Reviewed and stable  Last Vitals:  Vitals:   08/30/17 0930 08/30/17 1209  BP: 114/74 (!) 140/98  Pulse: 76 99  Resp:  16  Temp:  (!) 36.2 C  SpO2: 97% 99%    Last Pain:  Vitals:   08/30/17 1209  TempSrc:   PainSc: Asleep         Complications: No apparent anesthesia complications

## 2017-08-30 NOTE — Anesthesia Preprocedure Evaluation (Addendum)
Anesthesia Evaluation  Patient identified by MRN, date of birth, ID band Patient awake    Reviewed: Allergy & Precautions, NPO status , Patient's Chart, lab work & pertinent test results  Airway Mallampati: II  TM Distance: >3 FB Neck ROM: Full    Dental  (+) Dental Advisory Given, Poor Dentition, Missing, Chipped   Pulmonary Current Smoker,    Pulmonary exam normal breath sounds clear to auscultation       Cardiovascular hypertension (Noncompliant with meds), Normal cardiovascular exam Rhythm:Regular Rate:Normal     Neuro/Psych negative neurological ROS  negative psych ROS   GI/Hepatic Neg liver ROS, GERD  Controlled and Medicated,  Endo/Other  negative endocrine ROS  Renal/GU negative Renal ROS  negative genitourinary   Musculoskeletal negative musculoskeletal ROS (+)   Abdominal   Peds  Hematology negative hematology ROS (+)   Anesthesia Other Findings   Reproductive/Obstetrics                            Anesthesia Physical Anesthesia Plan  ASA: II  Anesthesia Plan: General   Post-op Pain Management:    Induction: Intravenous  PONV Risk Score and Plan: 4 or greater and Treatment may vary due to age or medical condition, Scopolamine patch - Pre-op, Midazolam, Dexamethasone and Ondansetron  Airway Management Planned: LMA  Additional Equipment: None  Intra-op Plan:   Post-operative Plan: Extubation in OR  Informed Consent: I have reviewed the patients History and Physical, chart, labs and discussed the procedure including the risks, benefits and alternatives for the proposed anesthesia with the patient or authorized representative who has indicated his/her understanding and acceptance.   Dental advisory given  Plan Discussed with: CRNA  Anesthesia Plan Comments:         Anesthesia Quick Evaluation

## 2017-08-30 NOTE — Anesthesia Postprocedure Evaluation (Signed)
Anesthesia Post Note  Patient: Michelle Waters  Procedure(s) Performed: IRRIGATION AND DEBRIDEMENT INDEX FINGER (Right Index Finger)     Patient location during evaluation: PACU Anesthesia Type: General Level of consciousness: awake and alert Pain management: pain level controlled Vital Signs Assessment: post-procedure vital signs reviewed and stable Respiratory status: spontaneous breathing, nonlabored ventilation and respiratory function stable Cardiovascular status: blood pressure returned to baseline and stable Postop Assessment: no apparent nausea or vomiting Anesthetic complications: no    Last Vitals:  Vitals:   08/30/17 1240 08/30/17 1252  BP: 125/80 123/80  Pulse: 84 88  Resp: 12 16  Temp:    SpO2: 97% 96%    Last Pain:  Vitals:   08/30/17 1300  TempSrc:   PainSc: [place]

## 2017-08-30 NOTE — H&P (Signed)
  Michelle Waters is an 46 y.o. female.    Chief Complaint: right index finger pain and swelling  HPI: 46 y/o female with 3 week history of worsening right index finger pain and swelling. Pt presents today with increased edema, erythema to right hand with pain extending into her forearm. Pt it right hand dominant. Denies any previous issues with the right hand in the past. Remote history of a splinter in right index finger 3 weeks ago. Denies any other falls or injuries. Patient states that she had a splinter in the finger and she picked at it.  PCP:  Patient, No Pcp Per  PMH: Past Medical History:  Diagnosis Date  . Acid reflux     PSH: History reviewed. No pertinent surgical history.  Social History:  reports that she has been smoking.  She has been smoking about 0.50 packs per day. she has never used smokeless tobacco. She reports that she drinks alcohol. She reports that she does not use drugs.  Allergies:  Allergies  Allergen Reactions  . Ampicillin Swelling and Rash  . Penicillins Swelling and Rash  . Codeine Itching  . Hydrocodone Itching    Medications: Current Facility-Administered Medications  Medication Dose Route Frequency Provider Last Rate Last Dose  . ipratropium-albuterol (DUONEB) 0.5-2.5 (3) MG/3ML nebulizer solution 3 mL  3 mL Nebulization Once Lance Bosch, NP       Current Outpatient Medications  Medication Sig Dispense Refill  . albuterol (PROVENTIL HFA;VENTOLIN HFA) 108 (90 Base) MCG/ACT inhaler Inhale 2 puffs into the lungs every 6 (six) hours as needed for wheezing or shortness of breath. 54 g 3  . atorvastatin (LIPITOR) 10 MG tablet Take 1 tablet (10 mg total) by mouth daily at 6 PM. 30 tablet 3  . losartan (COZAAR) 25 MG tablet TAKE 1/2 TABLET BY MOUTH DAILY. 30 tablet 0  . omeprazole (PRILOSEC) 20 MG capsule TAKE 1 CAPSULE BY MOUTH DAILY BEFORE BREAKFAST. WAIT 30 MINUTES BEFORE EATING 30 capsule 0    No results found for this or any previous  visit (from the past 48 hour(s)). No results found.  ROS: ROS Right hand pain, edema, and erythema  Physical Exam: Alert and appropriate 46 y/o female in no acute distress Right index finger with moderate to severe edema to right PIP joint with edema extending down into the right 2nd MCP joint Limited rom of right index finger Decreased sensation to right index and 3rd fingers as compared to left  nv intact otherwise in the hand Pain in right dorsal arm and with rom   Assessment/Plan Assessment: right index finger cellulitis and probable PIP sepsis  Plan: Recommend admission and surgical management of the right index finger today Discussed case with Dr. Veverly Fells who will come and evaluate Pt NPO since 6 this morning so will need to wait several hours before surgery Keep pt NPO Pain management   I have examined the patient and agree with the above assessment and plan.  Recommend urgent I+D to prevent ascending infection

## 2017-08-30 NOTE — Anesthesia Procedure Notes (Addendum)
Procedure Name: LMA Insertion Date/Time: 08/30/2017 11:11 AM Performed by: Wilburn Cornelia, CRNA Pre-anesthesia Checklist: Patient identified, Emergency Drugs available, Suction available, Patient being monitored and Timeout performed Patient Re-evaluated:Patient Re-evaluated prior to induction Oxygen Delivery Method: Circle system utilized Preoxygenation: Pre-oxygenation with 100% oxygen Induction Type: IV induction LMA: LMA inserted LMA Size: 4.0 Placement Confirmation: positive ETCO2,  breath sounds checked- equal and bilateral and CO2 detector Tube secured with: Tape Dental Injury: Teeth and Oropharynx as per pre-operative assessment

## 2017-08-30 NOTE — Brief Op Note (Signed)
08/30/2017  12:11 PM  PATIENT:  Michelle Waters  46 y.o. female  PRE-OPERATIVE DIAGNOSIS:  right index finger infection  POST-OPERATIVE DIAGNOSIS:  right index finger infection  PROCEDURE:  Procedure(s): IRRIGATION AND DEBRIDEMENT INDEX FINGER (Right)  SURGEON:  Surgeon(s) and Role:    Netta Cedars, MD - Primary  PHYSICIAN ASSISTANT:   ASSISTANTS: none   ANESTHESIA:   general  EBL:  5 mL   BLOOD ADMINISTERED:none  DRAINS: Penrose drain in the dorsal finger   LOCAL MEDICATIONS USED:  NONE  SPECIMEN:  Source of Specimen:  right index finger swab  DISPOSITION OF SPECIMEN:  microbiology  COUNTS:  YES  TOURNIQUET:  * No tourniquets in log *  DICTATION: .Other Dictation: Dictation Number [id number]  PLAN OF CARE: Admit to inpatient   PATIENT DISPOSITION:  PACU - hemodynamically stable.   Delay start of Pharmacological VTE agent (>24hrs) due to surgical blood loss or risk of bleeding: not applicable

## 2017-08-31 ENCOUNTER — Encounter (HOSPITAL_COMMUNITY): Payer: Self-pay | Admitting: Orthopedic Surgery

## 2017-08-31 NOTE — Care Management Note (Signed)
Case Management Note  Patient Details  Name: Michelle Waters MRN: [id number] Date of Birth: 1971/06/16  Subjective/Objective:                    Action/Plan:  Continue to follow, NCM can provide [facility] letter on day of discharge. Expected Discharge Date:                  Expected Discharge Plan:  Home/Self Care  In-House Referral:     Discharge planning Services  CM Consult, [place] [facility], [facility], Medication Assistance  Post Acute Care Choice:    Choice offered to:     DME Arranged:    DME Agency:     HH Arranged:    HH Agency:     Status of Service:  In process, will continue to follow  If discussed at Long Length of Stay Meetings, dates discussed:    Additional Comments:  Marilu Favre, RN 08/31/2017, 12:07 PM

## 2017-08-31 NOTE — Progress Notes (Signed)
   Subjective: 1 Day Post-Op Procedure(s) (LRB): IRRIGATION AND DEBRIDEMENT INDEX FINGER (Right)  Pt c/o moderate soreness in the hand this morning Denies any new symptoms or issues Patient reports pain as moderate.  Objective:   VITALS:   Vitals:   08/31/17 0001 08/31/17 0508  BP: 116/69 113/67  Pulse: 67 71  Resp: 18 20  Temp: 97.9 F (36.6 C) 98.1 F (36.7 C)  SpO2: 94% 100%    Right index finger incision healing well Penrose drains pulled Dressing reapplied nv intact distally No drainage currently  LABS Recent Labs    08/30/17 1007  HGB 11.8*  HCT 35.3*  WBC 4.8  PLT 285    Recent Labs    08/30/17 1007  NA 140  K 3.5  BUN <5*  CREATININE 0.87  GLUCOSE 86     Assessment/Plan: 1 Day Post-Op Procedure(s) (LRB): IRRIGATION AND DEBRIDEMENT INDEX FINGER (Right) Continue IV antibiotics Plan to d/c tomorrow on oral antibiotics Elevate the hand when possible Pain management    Merla Riches, MPAS, PA-C  08/31/2017, 11:00 AM

## 2017-08-31 NOTE — Op Note (Signed)
Michelle Waters, Michelle Waters               ACCOUNT NO.:  [id number]  MEDICAL RECORD NO.:  [id number]  LOCATION:                                 FACILITY:  PHYSICIAN:  Doran Heater. Veverly Fells, M.D.      DATE OF BIRTH:  DATE OF PROCEDURE:  08/30/2017 DATE OF DISCHARGE:                              OPERATIVE REPORT   PREOPERATIVE DIAGNOSIS:  Right index finger infection, deep.  POSTOPERATIVE DIAGNOSIS:  Right index finger infection, deep.  PROCEDURE PERFORMED:  Right finger incision and drainage with placement of Penrose drains for right finger infection, arthrocentesis of right index metacarpophalangeal joint and obtaining of cultures.  ATTENDING SURGEON:  Doran Heater. Veverly Fells, MD.  ASSISTANT:  None.  ANESTHESIA:  General anesthesia was used.  ESTIMATED BLOOD LOSS:  Minimal.  FLUID REPLACEMENT:  1000 mL of crystalloid.  INSTRUMENT COUNTS:  Correct.  COMPLICATIONS:  There were no complications.  Perioperative antibiotics were given.  INDICATIONS:  The patient is a 46 year old female with a 3-week history of increasing right index finger swelling and redness and pain and diminished range of motion.  The patient reported having a splinter in her dorsal finger and trying to pick at that to get it out and developing an infection.  Following that, she presented to the [facility] Emergency Department where x-rays revealed no foreign body.  She is brought now for progressive infection in her index finger for I and D. informed consent was obtained.  DESCRIPTION OF PROCEDURE:  After an adequate level of anesthesia was achieved, the patient was positioned supine on the operating table. Nonsterile tourniquet was placed on right proximal arm.  Right arm was sterilely prepped and draped in usual manner.  Time-out called.  We entered the index finger with lateral and medial incisions to the finger over the P1 portion of the digit.  We dissected the skin with the 15 blade scalpel and then hemostats to  spread down to the volar and dorsal aspects of the index finger.  I was able to identify an area of cloudy fluid dorsally that was fairly superficial.  Irrigated with 6 L of normal saline irrigation, also using a syringe and 18-gauge Angiocath. I did go ahead given swelling around the metacarpophalangeal joint and do an arthrocentesis or aspiration of the index MP joint through an area of clear skin and there was no purulence or fluid in that joint. Following our decompression and irrigation, we just placed Penrose drains in the superficial areas dorsally and then a single suture over the medial aspect of the finger and then two lateral 3-0 nylon sutures loose closure.  Sterile compressive bandage applied.  The patient was transported to the recovery room in stable condition.  We did obtain cultures prior to administration of clindamycin 600 mg.     Doran Heater. Veverly Fells, M.D.   ______________________________ Doran Heater. Veverly Fells, M.D.    SRN/MEDQ  D:  08/30/2017  T:  08/30/2017  Job:  [id number]

## 2017-09-01 ENCOUNTER — Telehealth: Payer: Self-pay | Admitting: Infectious Disease

## 2017-09-01 LAB — HIV ANTIBODY (ROUTINE TESTING W REFLEX): HIV Screen 4th Generation wRfx: REACTIVE — AB

## 2017-09-01 LAB — HIV 1/2 AB DIFFERENTIATION
HIV 1 Ab: POSITIVE — AB
HIV 2 Ab: NEGATIVE

## 2017-09-01 MED ORDER — OXYCODONE-ACETAMINOPHEN 5-325 MG PO TABS: 1.0000 | ORAL_TABLET | ORAL | 0 refills | Status: DC | PRN

## 2017-09-01 NOTE — Telephone Encounter (Signed)
Appears to be a new diagnosis  Can we alert DIS and bring her to clinic. She tested negative in 2008  Michelle Waters

## 2017-09-01 NOTE — Care Management Note (Signed)
Case Management Note  Patient Details  Name: Michelle Waters MRN: [id number] Date of Birth: 19-Mar-1971  Subjective/Objective:                    Action/Plan:   Expected Discharge Date:  09/01/17               Expected Discharge Plan:  Home/Self Care  In-House Referral:     Discharge planning Services  CM Consult, [place] [facility], [facility], Medication Assistance  Post Acute Care Choice:    Choice offered to:  Patient  DME Arranged:    DME Agency:     HH Arranged:    [place] Agency:     Status of Service:  Completed, signed off  If discussed at [employer] of Stay Meetings, dates discussed:    Additional Comments:  Marilu Favre, RN 09/01/2017, 1:46 PM

## 2017-09-01 NOTE — Progress Notes (Signed)
Elpidia L Pranger to be D/C'd  per MD order. Discussed with the patient and all questions fully answered.  VSS, Skin clean, dry and intact without evidence of skin break down, no evidence of skin tears noted.  IV catheter discontinued intact. Site without signs and symptoms of complications. Dressing and pressure applied.  An After Visit Summary was printed and given to the patient. Patient received prescription.  D/c education completed with patient/family including follow up instructions, medication list, d/c activities limitations if indicated, with other d/c instructions as indicated by MD - patient able to verbalize understanding, all questions fully answered.   Patient instructed to return to ED, call 911, or call MD for any changes in condition.   Patient to be escorted via [place], and D/C home via private auto.

## 2017-09-01 NOTE — Discharge Summary (Signed)
Physician Discharge Summary    Patient ID: Michelle Waters MRN: [id number] DOB/AGE: 48/23/1972 48 y.o.  Admit date: 08/30/2017 Discharge date:  09/01/17 Procedures:  Procedure(s) (LRB): IRRIGATION AND DEBRIDEMENT INDEX FINGER (Right)  Attending Physician:  Dr. Esmond Plants  Admission Diagnoses:   Right index finger infection  Consultants:  none  Discharge Diagnoses:  Active Problems:   Bacterial infection of finger or toe  Past Medical History:  Diagnosis Date  . Acid reflux   . Hypertension      PCP: Patient, No Pcp Per   Discharged Condition: good  Hospital Course:  Patient underwent the above stated procedure on 08/30/2017. Patient tolerated the procedure well and brought to the recovery room in good condition and subsequently to the floor. No complications during their hospital stay. No complicating wound issues during the hospital stay. Incision healing well and good early range of motion  Disposition: 01-Home or Self Care with follow up in 2 weeks  Medications: Current Facility-Administered Medications  Medication Dose Route Frequency Provider Last Rate Last Dose  . 0.9 %  sodium chloride infusion   Intravenous Continuous Cline Crock, PA-C 50 mL/hr at 09/01/17 1039    . albuterol (PROVENTIL) (2.5 MG/3ML) 0.083% nebulizer solution 3 mL  3 mL Inhalation Q6H PRN Cline Crock, PA-C      . atorvastatin (LIPITOR) tablet 10 mg  10 mg Oral q1800 Cline Crock, PA-C   10 mg at 08/31/17 1756  . clindamycin (CLEOCIN) IVPB 600 mg  600 mg Intravenous Rainey Pines, MD 100 mL/hr at 09/01/17 1152 600 mg at 09/01/17 1152  . ipratropium-albuterol (DUONEB) 0.5-2.5 (3) MG/3ML nebulizer solution 3 mL  3 mL Nebulization Once Verna Hamon, Robb Matar, PA-C      . lidocaine (PF) (XYLOCAINE) 1 % injection 10 mL  10 mL Intradermal Once Harris, Abigail, PA-C      . losartan (COZAAR) tablet 12.5 mg  12.5 mg Oral Daily Cline Crock, PA-C   12.5 mg at  09/01/17 0932  . morphine 4 MG/ML injection 4 mg  4 mg Intravenous Q2H PRN Cline Crock, PA-C   4 mg at 08/31/17 0865  . ondansetron (ZOFRAN-ODT) disintegrating tablet 4 mg  4 mg Oral Q6H PRN Cline Crock, PA-C       Or  . ondansetron [facility]) injection 4 mg  4 mg Intravenous Q6H PRN Cline Crock, PA-C   4 mg at 08/30/17 1801  . oxyCODONE-acetaminophen (PERCOCET/ROXICET) 5-325 MG per tablet 1-2 tablet  1-2 tablet Oral Q4H PRN Netta Cedars, MD   2 tablet at 09/01/17 1151  . pantoprazole (PROTONIX) EC tablet 40 mg  40 mg Oral Daily Cline Crock, PA-C   40 mg at 09/01/17 7846    Follow-up Information    Netta Cedars, MD. Call in 1 week(s).   Specialty:  Orthopedic Surgery Why:  [phone] Contact information: [address] [place] [address] [place]. Schedule an appointment as soon as possible for a visit.   Contact information: [place] [address] [phone]          Discharge Instructions    Call MD / Call 911   Complete by:  As directed    If you experience chest pain or shortness of breath, CALL 911 and be transported to the hospital emergency room.  If you develope a fever above 101 F, pus (  white drainage) or increased drainage or redness at the wound, or calf pain, call your surgeon's office.   Call MD / Call 911   Complete by:  As directed    If you experience chest pain or shortness of breath, CALL 911 and be transported to the hospital emergency room.  If you develope a fever above 101 F, pus (white drainage) or increased drainage or redness at the wound, or calf pain, call your surgeon's office.   Constipation Prevention   Complete by:  As directed    Drink plenty of fluids.  Prune juice may be helpful.  You may use a stool softener, such as Colace (over the counter) 100 mg twice a day.  Use MiraLax (over the counter)  for constipation as needed.   Constipation Prevention   Complete by:  As directed    Drink plenty of fluids.  Prune juice may be helpful.  You may use a stool softener, such as Colace (over the counter) 100 mg twice a day.  Use MiraLax (over the counter) for constipation as needed.   Diet - low sodium heart healthy   Complete by:  As directed    Diet - low sodium heart healthy   Complete by:  As directed    Increase activity slowly as tolerated   Complete by:  As directed    Increase activity slowly as tolerated   Complete by:  As directed         Signed: Ventura Bruns 09/01/2017, 12:39 PM

## 2017-09-01 NOTE — Progress Notes (Signed)
Orthopedics Progress Note  Subjective: Patient reporting ongoing pain in the hand but it is improving.  Objective:  Vitals:   08/31/17 2133 09/01/17 0513  BP: 100/61 116/81  Pulse: (!) 59 67  Resp: 20 20  Temp: 98.5 F (36.9 C) 98.4 F (36.9 C)  SpO2: 95% 97%    General: Awake and alert  Musculoskeletal: decreased swelling in the right hand and index finger.  There is still some finger swelling and erythema, sensation is diminished on the ulnar border of the index finger. No active drainage Neurovascularly intact  Lab Results  Component Value Date   WBC 4.8 08/30/2017   HGB 11.8 (L) 08/30/2017   HCT 35.3 (L) 08/30/2017   MCV 86.3 08/30/2017   PLT 285 08/30/2017       Component Value Date/Time   NA 140 08/30/2017 1007   K 3.5 08/30/2017 1007   CL 109 08/30/2017 1007   CO2 24 08/30/2017 1007   GLUCOSE 86 08/30/2017 1007   BUN <5 (L) 08/30/2017 1007   CREATININE 0.87 08/30/2017 1007   CREATININE 1.00 11/02/2015 1159   CALCIUM 8.9 08/30/2017 1007   GFRNONAA >60 08/30/2017 1007   GFRNONAA 77 01/17/2015 1155   GFRAA >60 08/30/2017 1007   GFRAA 89 01/17/2015 1155    No results found for: INR, PROTIME  Assessment/Plan: POD #2 s/p Procedure(s): IRRIGATION AND DEBRIDEMENT INDEX FINGER Right hand infection, clinically improved.  Patient on IV Clinda right now. Plan to change to po Doxycycline.  Her culture from surgery is growing out Gram pos rods, Gram neg rods, and Gram Pos Cocci. Will need broad spectrum coverage and close follow up. Will discuss with Dr Caralyn Guile.  Doran Heater. Veverly Fells, MD 09/01/2017 12:10 PM

## 2017-09-03 MED FILL — OXYCODONE-ACETAMINOPHEN 5-3: 5-325 | 3 days supply | Qty: 40 | Fill #0

## 2017-09-03 MED FILL — DOXYCYCLINE HYCLATE 100 MG: 100 | 10 days supply | Qty: 20 | Fill #0

## 2017-09-04 LAB — AEROBIC/ANAEROBIC CULTURE W GRAM STAIN (SURGICAL/DEEP WOUND)

## 2017-09-04 LAB — AEROBIC/ANAEROBIC CULTURE (SURGICAL/DEEP WOUND)

## 2017-09-14 ENCOUNTER — Inpatient Hospital Stay: Payer: PRIVATE HEALTH INSURANCE

## 2017-09-23 ENCOUNTER — Ambulatory Visit: Payer: Self-pay

## 2017-09-23 ENCOUNTER — Other Ambulatory Visit: Payer: Self-pay

## 2017-09-23 DIAGNOSIS — B20 Human immunodeficiency virus [HIV] disease: Secondary | ICD-10-CM

## 2017-09-23 LAB — URINALYSIS
BILIRUBIN URINE: NEGATIVE
GLUCOSE, UA: NEGATIVE
HGB URINE DIPSTICK: NEGATIVE
KETONES UR: NEGATIVE
Nitrite: NEGATIVE
PROTEIN: NEGATIVE
Specific Gravity, Urine: 1.007 (ref 1.001–1.03)
pH: 5.5 (ref 5.0–8.0)

## 2017-09-24 LAB — LIPID PANEL
Cholesterol: 227 mg/dL — ABNORMAL HIGH (ref <200)
HDL: 97 mg/dL (ref >50)
LDL Cholesterol (Calc): 105 mg/dL (calc) — ABNORMAL HIGH
Non-HDL Cholesterol (Calc): 130 mg/dL (calc) — ABNORMAL HIGH (ref <130)
Total CHOL/HDL Ratio: 2.3 (calc) (ref <5.0)
Triglycerides: 135 mg/dL (ref <150)

## 2017-09-24 LAB — CBC WITH DIFFERENTIAL/PLATELET
BASOS ABS: 28 {cells}/uL (ref 0–200)
Basophils Relative: 0.5 %
EOS PCT: 0.5 %
Eosinophils Absolute: 28 cells/uL (ref 15–500)
HEMATOCRIT: 38.3 % (ref 35.0–45.0)
Hemoglobin: 12.7 g/dL (ref 11.7–15.5)
LYMPHS ABS: 2733 {cells}/uL (ref 850–3900)
MCH: 28.3 pg (ref 27.0–33.0)
MCHC: 33.2 g/dL (ref 32.0–36.0)
MCV: 85.3 fL (ref 80.0–100.0)
MPV: 11.3 fL (ref 7.5–12.5)
Monocytes Relative: 7.3 %
NEUTROS PCT: 42.9 %
Neutro Abs: 2402 cells/uL (ref 1500–7800)
Platelets: 313 10*3/uL (ref 140–400)
RBC: 4.49 10*6/uL (ref 3.80–5.10)
RDW: 13.2 % (ref 11.0–15.0)
Total Lymphocyte: 48.8 %
WBC mixed population: 409 cells/uL (ref 200–950)
WBC: 5.6 10*3/uL (ref 3.8–10.8)

## 2017-09-24 LAB — HEPATITIS C ANTIBODY
Hepatitis C Ab: NONREACTIVE
SIGNAL TO CUT-OFF: 0.07

## 2017-09-24 LAB — RPR: RPR Ser Ql: NONREACTIVE

## 2017-09-24 LAB — T-HELPER CELL (CD4) - (RCID CLINIC ONLY)
CD4 % Helper T Cell: 38 % (ref 33–55)
CD4 T Cell Abs: 1080 /uL (ref 400–2700)

## 2017-09-24 LAB — COMPLETE METABOLIC PANEL WITH GFR
AG RATIO: 1.1 (calc) (ref 1.0–2.5)
ALT: 16 U/L (ref 6–29)
AST: 25 U/L (ref 10–35)
Albumin: 4.7 g/dL (ref 3.6–5.1)
Alkaline phosphatase (APISO): 120 U/L — ABNORMAL HIGH (ref 33–115)
BILIRUBIN TOTAL: 0.6 mg/dL (ref 0.2–1.2)
BUN / CREAT RATIO: 10 (calc) (ref 6–22)
BUN: 13 mg/dL (ref 7–25)
CHLORIDE: 97 mmol/L — AB (ref 98–110)
CO2: 22 mmol/L (ref 20–32)
Calcium: 9.9 mg/dL (ref 8.6–10.2)
Creat: 1.25 mg/dL — ABNORMAL HIGH (ref 0.50–1.10)
GFR, EST AFRICAN AMERICAN: 59 mL/min/{1.73_m2} — AB (ref >=60)
GFR, Est Non African American: 51 mL/min/{1.73_m2} — ABNORMAL LOW (ref >=60)
Globulin: 4.2 g/dL (calc) — ABNORMAL HIGH (ref 1.9–3.7)
Glucose, Bld: 102 mg/dL — ABNORMAL HIGH (ref 65–99)
POTASSIUM: 4.1 mmol/L (ref 3.5–5.3)
Sodium: 131 mmol/L — ABNORMAL LOW (ref 135–146)
TOTAL PROTEIN: 8.9 g/dL — AB (ref 6.1–8.1)

## 2017-09-24 LAB — HEPATITIS B SURFACE ANTIGEN: HEP B S AG: NONREACTIVE

## 2017-09-24 LAB — URINE CYTOLOGY ANCILLARY ONLY
Chlamydia: NEGATIVE
Neisseria Gonorrhea: NEGATIVE

## 2017-09-24 LAB — HEPATITIS B CORE ANTIBODY, TOTAL: HEP B C TOTAL AB: REACTIVE — AB

## 2017-09-24 LAB — HEPATITIS B SURFACE ANTIBODY,QUALITATIVE: Hep B S Ab: REACTIVE — AB

## 2017-09-24 LAB — HEPATITIS A ANTIBODY, TOTAL: HEPATITIS A AB,TOTAL: REACTIVE — AB

## 2017-10-01 LAB — QUANTIFERON TB GOLD ASSAY (BLOOD)
MITOGEN-NIL SO: 6.96 [IU]/mL
QUANTIFERON NIL VALUE: 0.04 [IU]/mL
QUANTIFERON(R)-TB GOLD: NEGATIVE
Quantiferon Tb Ag Minus Nil Value: <0 IU/mL

## 2017-10-01 LAB — HLA B*5701: HLA-B*5701 w/rflx HLA-B High: NEGATIVE

## 2017-10-03 LAB — HIV-1 RNA ULTRAQUANT REFLEX TO GENTYP+
HIV 1 RNA Quant: 477 Copies/mL — ABNORMAL HIGH
HIV-1 RNA Quant, Log: 2.68 Log cps/mL — ABNORMAL HIGH

## 2017-10-03 LAB — RFLX HIV-1 INTEGRASE GENOTYPE: HIV-1 Genotype: DETECTED — AB

## 2017-10-05 ENCOUNTER — Encounter: Payer: Self-pay | Admitting: Infectious Diseases

## 2017-10-05 ENCOUNTER — Ambulatory Visit (INDEPENDENT_AMBULATORY_CARE_PROVIDER_SITE_OTHER): Payer: PRIVATE HEALTH INSURANCE | Admitting: Licensed Clinical Social Worker

## 2017-10-05 ENCOUNTER — Ambulatory Visit (INDEPENDENT_AMBULATORY_CARE_PROVIDER_SITE_OTHER): Payer: Self-pay | Admitting: Infectious Diseases

## 2017-10-05 ENCOUNTER — Ambulatory Visit (INDEPENDENT_AMBULATORY_CARE_PROVIDER_SITE_OTHER): Payer: Self-pay | Admitting: Pharmacist

## 2017-10-05 DIAGNOSIS — B20 Human immunodeficiency virus [HIV] disease: Secondary | ICD-10-CM

## 2017-10-05 DIAGNOSIS — F17200 Nicotine dependence, unspecified, uncomplicated: Secondary | ICD-10-CM

## 2017-10-05 DIAGNOSIS — Z0000 Encounter for general adult medical examination without abnormal findings: Secondary | ICD-10-CM | POA: Insufficient documentation

## 2017-10-05 DIAGNOSIS — I10 Essential (primary) hypertension: Secondary | ICD-10-CM

## 2017-10-05 DIAGNOSIS — F4322 Adjustment disorder with anxiety: Secondary | ICD-10-CM

## 2017-10-05 DIAGNOSIS — R7989 Other specified abnormal findings of blood chemistry: Secondary | ICD-10-CM | POA: Insufficient documentation

## 2017-10-05 DIAGNOSIS — Z23 Encounter for immunization: Secondary | ICD-10-CM

## 2017-10-05 HISTORY — DX: Other specified abnormal findings of blood chemistry: R79.89

## 2017-10-05 MED ORDER — ASPIRIN EC 81 MG PO TBEC: 81.0000 mg | DELAYED_RELEASE_TABLET | Freq: Every day | ORAL | 0 refills | Status: DC

## 2017-10-05 MED ORDER — BICTEGRAVIR-EMTRICITAB-TENOFOV 50-200-25 MG PO TABS: 1.0000 | ORAL_TABLET | Freq: Every day | ORAL | 5 refills | Status: DC

## 2017-10-05 NOTE — Assessment & Plan Note (Signed)
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 I discussed with [place] treatment options/side effects, benefits of treatment and long-term outcomes. I discussed how HIV is transmitted and the process of untreated HIV including increased risk for opportunistic infections, cancer, dementia and renal failure. She was counseled on routine HIV care including medication adherence, blood monitoring, necessary vaccines and follow up visits. Counseled regarding safe sex practices including: condom use, partner disclosure, limiting partners and potential for PrEP. She spent time talking with our pharmacist Cassie regarding successful practices of ART and understands to reach out to our clinic in the future with questions.   I will start her on BIKTARVY today. She has met with our financial team and is awawiting RW/UMAP enrollment. She is very anxious to get started on medication so will enroll her in emergency drug assistance through [place] today. She will return in 2 months to see me with a visit to our pharmacy team in between.   I spent greater than 45 minutes with the patient today. Greater than 50% of the time spent face-to-face counseling and coordination of care re: HIV and health maintenance.

## 2017-10-05 NOTE — Patient Instructions (Addendum)
Biktarvy is the pill for your HIV infection - this will need to be taken once a day around the same time.  - Common side effects for a short time frame usually include headaches, nausea and diarrhea - OK to take over the counter tylenol for headaches and imodium for diarrhea - Try taking with food if you are nauseated   Will have you back to see our pharmacy team in 4 weeks and Colletta Maryland again in 8 weeks.

## 2017-10-05 NOTE — Assessment & Plan Note (Signed)
This is new since in hospital values. Could be partially r/t she has had decreased appetite in anticipation for this appointment and PO intake has been poor. Will monitor after starting her on ART.

## 2017-10-05 NOTE — Assessment & Plan Note (Signed)
Seems controlled off medications. Will continue to monitor.

## 2017-10-05 NOTE — Assessment & Plan Note (Addendum)
Will do pap smear for her at next appointment with me. Flu shot today. Pneumonia and Menveo at next appointment. Needs screening mammogram as well - will discuss at upcoming appointment. 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 LFTs normal -- will monitor with addition of TAF.

## 2017-10-05 NOTE — Assessment & Plan Note (Signed)
Counseled on increased risk for CV events being she is HIV+. Advised to start taking a baby aspirin daily being she has a history of high blood pressure and active smoker.

## 2017-10-05 NOTE — BH Specialist Note (Signed)
Integrated Behavioral Health Initial Visit  MRN: [id number] Name: Michelle Waters  Number of [place] Clinician visits:: 1/6 Session Start time: 11:17 am  Session End time: 11:21 am Total time: 4 mins  Type of Service: [place] Interpretor:No. Interpretor Name and Language: N/A   Warm Hand Off Completed.       SUBJECTIVE: [name] is a 46 y.o. female accompanied by self Patient was referred by Janene Madeira for recent diagnosis.  Patient reports the following symptoms/concerns: Patient reported being more adjusted to her HIV diagnosis that her first lab appointment where she presented as tearful and hopeless.  Patient stated "I'm doing good.  I learnt to cope and deal with it".  Patient informed that her parents have been supportive in helping her stay positive and hopeful.  Patient reported a decrease in episodes of crying and reported now having a positive outlook on her life.  Michelle Waters provided education on the benefits of medication compliance and related compliance to outcomes.  Duration of problem: November 2018; Severity of problem: mild  OBJECTIVE: Mood: Anxious and Affect: Appropriate Risk of harm to self or others: No plan to harm self or others  LIFE CONTEXT: Family and Social: Patient is married and lives with her husband.  She denies that they engage in sexual intercourse and stated that he remains HIV (-).  Patient's parents are supportive. School/Work: Patient does not work. Self-Care: Patient is able to tend to her ADL's and has not received her first dose of medications yet but reported that she is motivated to become medication compliant. Life Changes: Patient was diagnosed with HIV last month.  ASSESSMENT: Patient is currently experiencing anxiety and hopelessness due to recent HIV diagnosis.  Patient may benefit from medication education and behavioral health services.  GOALS ADDRESSED: Patient  will: 1. Reduce symptoms of: anxiety and hopelessness 2. Increase knowledge and/or ability of: coping skills and healthy habits  3. Demonstrate ability to: Increase healthy adjustment to current life circumstances and Improve medication compliance  INTERVENTIONS: Interventions utilized: Supportive Counseling, Medication Monitoring and Psychoeducation and/or Health Education   PLAN: 1. Patient was provided with a business card and will call to schedule an appointment with the [facility] as needed.  No appointment was scheduled with the [facility] at this time. 2. Behavioral recommendations: Take all medications daily and report problems and side effects to the provider.   Sande Rives, [facility]

## 2017-10-05 NOTE — Progress Notes (Signed)
[facility] Pharmacy Visit  HPI: Michelle Waters is a 46 y.o. female who presents to the [facility] clinic today to initiate care for his newly diagnosed HIV infection.   Patient Active Problem List   Diagnosis Date Noted  . HIV disease ([place]) 10/05/2017  . Bacterial infection of finger or toe 08/30/2017  . Smoker 01/17/2015  . Family history of diabetes mellitus (DM) 01/17/2015  . Essential hypertension 01/17/2015      Medication List        Accurate as of 10/05/17 12:07 PM. Always use your most recent med list.          acetaminophen 500 MG tablet Commonly known as:  TYLENOL   albuterol 108 (90 Base) MCG/ACT inhaler Commonly known as:  PROVENTIL HFA;VENTOLIN HFA Inhale 2 puffs into the lungs every 6 (six) hours as needed for wheezing or shortness of breath.   atorvastatin 10 MG tablet Commonly known as:  LIPITOR Take 1 tablet (10 mg total) by mouth daily at 6 PM.   bictegravir-emtricitabine-tenofovir AF 50-200-25 MG Tabs tablet Commonly known as:  BIKTARVY Take 1 tablet by mouth daily. Try to take at the same time each day with or without food.   diphenhydrAMINE 25 mg capsule Commonly known as:  BENADRYL   losartan 25 MG tablet Commonly known as:  COZAAR TAKE 1/2 TABLET BY MOUTH DAILY.   omeprazole 20 MG capsule Commonly known as:  PRILOSEC TAKE 1 CAPSULE BY MOUTH DAILY BEFORE BREAKFAST. WAIT 30 MINUTES BEFORE EATING   oxyCODONE-acetaminophen 5-325 MG tablet Commonly known as:  PERCOCET/ROXICET Take 1-2 tablets every 4 (four) hours as needed by mouth for moderate pain or severe pain.   tetrahydrozoline 0.05 % ophthalmic solution       Where to Get Your Medications    You can get these medications from any pharmacy   Bring a paper prescription for each of these medications  bictegravir-emtricitabine-tenofovir AF 50-200-25 MG Tabs tablet     Allergies: Allergies  Allergen Reactions  . Ampicillin Swelling and Rash  . Penicillins  Swelling and Rash  . Shrimp [Shellfish Allergy] Swelling  . Codeine Itching  . Hydrocodone Itching    Past Medical History: Past Medical History:  Diagnosis Date  . Acid reflux   . Hypertension     Social History: Social History   Socioeconomic History  . Marital status: Married    Spouse name: Not on file  . Number of children: Not on file  . Years of education: Not on file  . Highest education level: Not on file  Social Needs  . Financial resource strain: Not on file  . Food insecurity - worry: Not on file  . Food insecurity - inability: Not on file  . Transportation needs - medical: Not on file  . Transportation needs - non-medical: Not on file  Occupational History  . Not on file  Tobacco Use  . Smoking status: Current Every Day Smoker    Packs/day: 0.50  . Smokeless tobacco: Never Used  Substance and Sexual Activity  . Alcohol use: Yes  . Drug use: No  . Sexual activity: Not on file  Other Topics Concern  . Not on file  Social History Narrative  . Not on file    Labs: HIV 1 RNA Quant (Copies/mL)  Date Value  09/23/2017 477 (H)   CD4 T Cell Abs (/uL)  Date Value  09/23/2017 1,080   Hep B S Ab (no units)  Date Value  09/23/2017 REACTIVE (A)   Hepatitis B Surface Ag (no units)  Date Value  09/23/2017 NON-REACTIVE   HCV Ab (no units)  Date Value  07/29/2007 NEGATIVE    Lipids:    Component Value Date/Time   CHOL 227 (H) 09/23/2017 1140   TRIG 135 09/23/2017 1140   HDL 97 09/23/2017 1140   CHOLHDL 2.3 09/23/2017 1140   VLDL 20 11/02/2015 1159   LDLCALC 96 11/02/2015 1159    Current HIV Regimen: None  Assessment: Aquila is here today to initiate care for her newly diagnosed HIV infection.  She saw our nurse practitioner Colletta Maryland, and we will start [place] today.  I spent some time going over [place] and how to take it.  I counseled her on the importance of not stopping the medication once she starts and not missing any doses in order  to prevent resistance from developing. I also discussed possible side effects. She had no questions for me.  I gave her my card and told he to call me with any issues.  I will see here again in 4 weeks for adherence assessment and labs. She is uninsured, so she will get advancing access with Caryl Pina today and bring in her [facility] for [facility] ASAP.  Plan: - Start Biktarvy PO once daily - F/u with me again 11/05/17 at [place]. [place], PharmD, [place], [facility] 10/05/2017, 12:07 PM

## 2017-10-05 NOTE — Progress Notes (Signed)
Michelle Waters 05/04/1971  [id number] PCP: Patient, No Pcp Per  Reason for Visit: HIV (+) - establishing care   Brief Narrative: Michelle Waters is a 46 y.o. AA female with HIV infection. First diagnosed in 09/2017 with CD4 nadir 1080, VL 472. HIV Risk: heterosexual unprotected sex. OIs: none Genotype: 09/2017 - K103R, V179D; sensitive to NRTI/PIs.   Patient Active Problem List   Diagnosis Date Noted  . HIV disease ([place]) 10/05/2017  . Elevated serum creatinine 10/05/2017  . Healthcare maintenance 10/05/2017  . Bacterial infection of finger or toe 08/30/2017  . Smoker 01/17/2015  . Family history of diabetes mellitus (DM) 01/17/2015   HPI:  HIV = Michelle Waters was diagnosed during recent hospitalization when she received a positive 4th generation test as part of routine screening during elective surgical procedure. Since that time she has had full labs outpatient and is very anxious to know her prognosis. She is married to a man and has been in a monogamous relationship with him for "16 years." Denies any other sexual partners or IV drug use. She would like to know when/how she was infected. Endorses no complaints today suggestive of associated opportunistic infection or advancing HIV disease such as fevers, night sweats, weight loss, anorexia, cough, SOB, nausea, vomiting, diarrhea, headache, sensory changes, lymphadenopathy or oral thrush.   Healthcare Maintenance = No longer having periods. Up to date on pap smear with last test in 2017 which she reports to have been normal. She has not had a screening mammogram. Previously on BP/cholesterol medications but not taking any rx's at this time.   Review of Systems: Review of Systems  Constitutional: Negative for chills, fever, malaise/fatigue and weight loss.  HENT: Negative for sore throat.        No dental problems  Respiratory: Negative for cough and sputum production.   Cardiovascular: Negative for chest pain and leg swelling.    Gastrointestinal: Negative for abdominal pain, diarrhea and vomiting.  Genitourinary: Negative for dysuria and flank pain.  Musculoskeletal: Negative for joint pain, myalgias and neck pain.  Skin: Negative for rash.  Neurological: Negative for dizziness, tingling and headaches.  Psychiatric/Behavioral: Negative for depression and substance abuse. The patient is nervous/anxious. The patient does not have insomnia.    Past Medical History:  Diagnosis Date  . Acid reflux   . Elevated serum creatinine 10/05/2017  . Hypertension    Outpatient Medications Prior to Visit  Medication Sig Dispense Refill  . acetaminophen (TYLENOL) 500 MG tablet Take 1,000 mg every 6 (six) hours as needed by mouth for moderate pain.    Marland Kitchen albuterol (PROVENTIL HFA;VENTOLIN HFA) 108 (90 Base) MCG/ACT inhaler Inhale 2 puffs into the lungs every 6 (six) hours as needed for wheezing or shortness of breath. 54 g 3  . diphenhydrAMINE (BENADRYL) 25 mg capsule Take 25 mg every 6 (six) hours as needed by mouth for allergies.    Marland Kitchen omeprazole (PRILOSEC) 20 MG capsule TAKE 1 CAPSULE BY MOUTH DAILY BEFORE BREAKFAST. WAIT 30 MINUTES BEFORE EATING (Patient not taking: Reported on 08/31/2017) 30 capsule 0  . oxyCODONE-acetaminophen (PERCOCET/ROXICET) 5-325 MG tablet Take 1-2 tablets every 4 (four) hours as needed by mouth for moderate pain or severe pain. 40 tablet 0  . tetrahydrozoline 0.05 % ophthalmic solution Place 1 drop daily as needed into both eyes (dry itchy eyes).    Marland Kitchen atorvastatin (LIPITOR) 10 MG tablet Take 1 tablet (10 mg total) by mouth daily at 6 PM. (Patient not taking: Reported on 08/31/2017) 30  tablet 3  . losartan (COZAAR) 25 MG tablet TAKE 1/2 TABLET BY MOUTH DAILY. (Patient not taking: Reported on 08/31/2017) 30 tablet 0   No facility-administered medications prior to visit.    Allergies  Allergen Reactions  . Ampicillin Swelling and Rash  . Penicillins Swelling and Rash  . Shrimp [Shellfish Allergy]  Swelling  . Codeine Itching  . Hydrocodone Itching   Social History   Tobacco Use  . Smoking status: Current Every Day Smoker    Packs/day: 0.50  . Smokeless tobacco: Never Used  Substance Use Topics  . Alcohol use: Yes  . Drug use: No   Family History  Problem Relation Age of Onset  . Hypertension Mother     Objective: Vitals:   10/05/17 1028  BP: 117/76  Pulse: 71  Temp: 98.4 F (36.9 C)   Physical Exam  Constitutional: She is oriented to person, place, and time and well-developed, well-nourished, and in no distress.  HENT:  Mouth/Throat: No oral lesions. No dental abscesses.  Cardiovascular: Normal rate, regular rhythm and normal heart sounds.  Pulmonary/Chest: Effort normal and breath sounds normal.  Abdominal: Soft. She exhibits no distension. There is no tenderness.  Musculoskeletal: Normal range of motion. She exhibits no tenderness.  Lymphadenopathy:    She has no cervical adenopathy.  Neurological: She is alert and oriented to person, place, and time.  Skin: Skin is warm and dry. No rash noted.  Psychiatric: Mood, affect and judgment normal.   Vitals:   10/05/17 1028  BP: 117/76  Pulse: 71  Temp: 98.4 F (36.9 C)   Lab Results Lab Results  Component Value Date   WBC 5.6 09/23/2017   HGB 12.7 09/23/2017   HCT 38.3 09/23/2017   MCV 85.3 09/23/2017   PLT 313 09/23/2017    Lab Results  Component Value Date   CREATININE 1.25 (H) 09/23/2017   BUN 13 09/23/2017   NA 131 (L) 09/23/2017   K 4.1 09/23/2017   CL 97 (L) 09/23/2017   CO2 22 09/23/2017    Lab Results  Component Value Date   ALT 16 09/23/2017   AST 25 09/23/2017   ALKPHOS 109 01/17/2015   BILITOT 0.6 09/23/2017    Lab Results  Component Value Date   CHOL 227 (H) 09/23/2017   HDL 97 09/23/2017   LDLCALC 96 11/02/2015   TRIG 135 09/23/2017   CHOLHDL 2.3 09/23/2017   HIV 1 RNA Quant (Copies/mL)  Date Value  09/23/2017 477 (H)   CD4 T Cell Abs (/uL)  Date Value  09/23/2017  1,080   No results found for: HIV1GENOSEQ No results found for: HAV Lab Results  Component Value Date   HEPBSAG NON-REACTIVE 09/23/2017   HEPBSAB REACTIVE (A) 09/23/2017   Lab Results  Component Value Date   HCVAB NEGATIVE 07/29/2007   Lab Results  Component Value Date   CHLAMYDIAWP Negative 09/23/2017   N Negative 09/23/2017   No results found for: GCPROBEAPT No results found for: QUANTGOLD  I have reviewed all available documents of her medical record.    Assessment and Plan:  Problem List Items Addressed This Visit      Cardiovascular and Mediastinum   RESOLVED: Essential hypertension    Seems controlled off medications. Will continue to monitor.       Relevant Medications   aspirin EC 81 MG tablet     Other   Elevated serum creatinine    This is new since in hospital values. Could be partially r/t she  has had decreased appetite in anticipation for this appointment and PO intake has been poor. Will monitor after starting her on ART.       Healthcare maintenance    Will do pap smear for her at next appointment with me. Flu shot today. Pneumonia and Menveo at next appointment. Needs screening mammogram as well - will discuss at upcoming appointment. 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 LFTs normal -- will monitor with addition of TAF.       HIV disease ([place])    New patient here to establish for HIV care - may be she is a long-term non-progressor considering her VL and CD4 count although I am uncertain as to when she acquired infection. I discussed with [place] treatment options/side effects, benefits of treatment and long-term outcomes. I discussed how HIV is transmitted and the process of untreated HIV including increased risk for opportunistic infections, cancer, dementia and renal failure. She was counseled on routine HIV care including medication adherence, blood monitoring, necessary vaccines and follow up visits.  Counseled regarding safe sex practices including: condom use, partner disclosure, limiting partners and potential for PrEP. She spent time talking with our pharmacist Cassie regarding successful practices of ART and understands to reach out to our clinic in the future with questions.   I will start her on BIKTARVY today. She has met with our financial team and is awawiting RW/UMAP enrollment. She is very anxious to get started on medication so will enroll her in emergency drug assistance through [facility] today. She will return in 2 months to see me with a visit to our pharmacy team in between.   I spent greater than 45 minutes with the patient today. Greater than 50% of the time spent face-to-face counseling and coordination of care re: HIV and health maintenance.        Relevant Medications   bictegravir-emtricitabine-tenofovir AF (BIKTARVY) 50-200-25 MG TABS tablet   Smoker    Counseled on increased risk for CV events being she is HIV+. Advised to start taking a baby aspirin daily being she has a history of high blood pressure and active smoker.        Other Visit Diagnoses    Need for immunization against influenza       Relevant Orders   Flu Vaccine QUAD 36+ mos IM (Completed)     Janene Madeira, MSN, NP-C [facility]  10/05/17 1:24 PM

## 2017-10-06 MED ORDER — BICTEGRAVIR-EMTRICITAB-TENOFOV 50-200-25 MG PO TABS: 1.0000 | ORAL_TABLET | Freq: Every day | ORAL | 2 refills | Status: DC

## 2017-10-06 MED FILL — BIKTARVY 50-200-25 MG TABS: 50-200-25 | 30 days supply | Qty: 30 | Fill #0

## 2017-10-06 NOTE — Addendum Note (Signed)
Addended by: Dinah Beers on: 10/06/2017 09:02 AM   Modules accepted: Orders

## 2017-10-21 ENCOUNTER — Encounter: Payer: Self-pay | Admitting: Licensed Clinical Social Worker

## 2017-10-28 MED FILL — BIKTARVY 50-200-25 MG TABS: 50-200-25 | 30 days supply | Qty: 30 | Fill #1

## 2017-11-05 ENCOUNTER — Ambulatory Visit: Payer: PRIVATE HEALTH INSURANCE

## 2017-11-12 ENCOUNTER — Ambulatory Visit: Payer: PRIVATE HEALTH INSURANCE

## 2017-11-17 ENCOUNTER — Ambulatory Visit (INDEPENDENT_AMBULATORY_CARE_PROVIDER_SITE_OTHER): Payer: Self-pay | Admitting: Pharmacist

## 2017-11-17 DIAGNOSIS — R110 Nausea: Secondary | ICD-10-CM

## 2017-11-17 DIAGNOSIS — B20 Human immunodeficiency virus [HIV] disease: Secondary | ICD-10-CM

## 2017-11-17 MED ORDER — BICTEGRAVIR-EMTRICITAB-TENOFOV 50-200-25 MG PO TABS: 1.0000 | ORAL_TABLET | Freq: Every day | ORAL | 5 refills | Status: DC

## 2017-11-17 MED ORDER — ONDANSETRON 4 MG PO TBDP: 4.0000 mg | ORAL_TABLET | Freq: Three times a day (TID) | ORAL | 0 refills | Status: DC | PRN

## 2017-11-17 NOTE — Addendum Note (Signed)
Addended by: Darletta Moll on: 11/17/2017 03:56 PM   Modules accepted: Orders

## 2017-11-17 NOTE — Progress Notes (Signed)
[facility] Pharmacy Visit  HPI: Michelle Waters is a 47 y.o. female who presents to the [facility] clinic for HIV follow-up.   Patient Active Problem List   Diagnosis Date Noted  . HIV disease ([place]) 10/05/2017  . Elevated serum creatinine 10/05/2017  . Healthcare maintenance 10/05/2017  . Bacterial infection of finger or toe 08/30/2017  . Smoker 01/17/2015  . Family history of diabetes mellitus (DM) 01/17/2015    Patient's Medications  New Prescriptions   No medications on file  Previous Medications   ACETAMINOPHEN (TYLENOL) 500 MG TABLET    Take 1,000 mg every 6 (six) hours as needed by mouth for moderate pain.   ALBUTEROL (PROVENTIL HFA;VENTOLIN HFA) 108 (90 BASE) MCG/ACT INHALER    Inhale 2 puffs into the lungs every 6 (six) hours as needed for wheezing or shortness of breath.   ASPIRIN EC 81 MG TABLET    Take 1 tablet (81 mg total) by mouth daily.   BICTEGRAVIR-EMTRICITABINE-TENOFOVIR AF (BIKTARVY) 50-200-25 MG TABS TABLET    Take 1 tablet by mouth daily. Try to take at the same time each day with or without food.   BICTEGRAVIR-EMTRICITABINE-TENOFOVIR AF (BIKTARVY) 50-200-25 MG TABS TABLET    Take 1 tablet by mouth daily.   DIPHENHYDRAMINE (BENADRYL) 25 MG CAPSULE    Take 25 mg every 6 (six) hours as needed by mouth for allergies.   OMEPRAZOLE (PRILOSEC) 20 MG CAPSULE    TAKE 1 CAPSULE BY MOUTH DAILY BEFORE BREAKFAST. WAIT 30 MINUTES BEFORE EATING   OXYCODONE-ACETAMINOPHEN (PERCOCET/ROXICET) 5-325 MG TABLET    Take 1-2 tablets every 4 (four) hours as needed by mouth for moderate pain or severe pain.   TETRAHYDROZOLINE 0.05 % OPHTHALMIC SOLUTION    Place 1 drop daily as needed into both eyes (dry itchy eyes).  Modified Medications   No medications on file  Discontinued Medications   No medications on file    Allergies: Allergies  Allergen Reactions  . Ampicillin Swelling and Rash  . Penicillins Swelling and Rash  . Shrimp [Shellfish Allergy]  Swelling  . Codeine Itching  . Hydrocodone Itching    Past Medical History: Past Medical History:  Diagnosis Date  . Acid reflux   . Elevated serum creatinine 10/05/2017  . Hypertension     Social History: Social History   Socioeconomic History  . Marital status: Married    Spouse name: Not on file  . Number of children: Not on file  . Years of education: Not on file  . Highest education level: Not on file  Social Needs  . Financial resource strain: Not on file  . Food insecurity - worry: Not on file  . Food insecurity - inability: Not on file  . Transportation needs - medical: Not on file  . Transportation needs - non-medical: Not on file  Occupational History  . Not on file  Tobacco Use  . Smoking status: Current Every Day Smoker    Packs/day: 0.50  . Smokeless tobacco: Never Used  Substance and Sexual Activity  . Alcohol use: Yes  . Drug use: No  . Sexual activity: Not on file  Other Topics Concern  . Not on file  Social History Narrative  . Not on file    Labs: HIV 1 RNA Quant (Copies/mL)  Date Value  09/23/2017 477 (H)   CD4 T Cell Abs (/uL)  Date Value  09/23/2017 1,080   Hep B S Ab (no units)  Date Value  09/23/2017  REACTIVE (A)   Hepatitis B Surface Ag (no units)  Date Value  09/23/2017 NON-REACTIVE   HCV Ab (no units)  Date Value  07/29/2007 NEGATIVE    Lipids:    Component Value Date/Time   CHOL 227 (H) 09/23/2017 1140   TRIG 135 09/23/2017 1140   HDL 97 09/23/2017 1140   CHOLHDL 2.3 09/23/2017 1140   VLDL 20 11/02/2015 1159   LDLCALC 96 11/02/2015 1159    Current HIV Regimen: Biktarvy  Assessment: Michelle Waters is here toady to follow-up with me for her newly diagnosed HIV infection.  I saw her back in December with Michelle Waters, our ID NP, as a newly diagnosed patient.  She started Biktarvy at that time.  She is on her 2nd fill from [facility]. We mail it to her.  She has not missed any doses of her Biktarvy.  .  She is  experiencing some nausea on occasion when she takes it.  Her appetite is still not great and when she takes it on an empty stomach, it hurts.  I will give her some Zofran. I told her to try and take it with food from now on to avoid the nausea. Also told her to take the Zofran ~20-30 minutes prior to the [place].  She did have an episode last Friday where she was vomiting all night, but that is the only time that has happened.  She brought her paystubs today for [facility] and HMAP (finally).  Hopefully she will be approved soon. She had a f/u scheduled with Michelle Waters for Monday 2/4, so I pushed it back until middle of March.  I will check a HIV viral load today since she has been on medications ~1.5 months.  Will go ahead and send to [facility] in [place] for them to mail once her HMAP is approved.  Her husband is interested in PrEP, so I told her about our clinic here and she will have him call to make an appointment his next day off.    Plan: - Continue Biktarvy PO once daily - HIV viral load today - F/u with Michelle Waters 3/12 at [facility]. Donnielle Addison, PharmD, [place] [place] 11/17/2017, 12:11 PM

## 2017-11-19 LAB — HIV-1 RNA QUANT-NO REFLEX-BLD
HIV 1 RNA Quant: <20 copies/mL
HIV-1 RNA QUANT, LOG: NOT DETECTED {Log_copies}/mL

## 2017-11-20 ENCOUNTER — Encounter: Payer: Self-pay | Admitting: *Deleted

## 2017-11-23 ENCOUNTER — Ambulatory Visit: Payer: PRIVATE HEALTH INSURANCE | Admitting: Infectious Diseases

## 2017-11-23 ENCOUNTER — Other Ambulatory Visit: Payer: Self-pay | Admitting: Pharmacist

## 2017-11-26 ENCOUNTER — Encounter: Payer: Self-pay | Admitting: Infectious Diseases

## 2017-11-27 MED FILL — BIKTARVY 50-200-25 MG TABS: 50-200-25 | 30 days supply | Qty: 30 | Fill #2

## 2017-12-21 ENCOUNTER — Other Ambulatory Visit: Payer: Self-pay | Admitting: Infectious Diseases

## 2017-12-28 MED FILL — BIKTARVY 50-200-25 MG TABS: 50-200-25 | 30 days supply | Qty: 30 | Fill #0

## 2017-12-29 ENCOUNTER — Encounter: Payer: Self-pay | Admitting: Infectious Diseases

## 2017-12-29 ENCOUNTER — Ambulatory Visit (INDEPENDENT_AMBULATORY_CARE_PROVIDER_SITE_OTHER): Payer: Self-pay | Admitting: Infectious Diseases

## 2017-12-29 VITALS — BP 152/86 | HR 69 | Temp 98.2°F | Wt 120.0 lb

## 2017-12-29 DIAGNOSIS — B20 Human immunodeficiency virus [HIV] disease: Secondary | ICD-10-CM

## 2017-12-29 DIAGNOSIS — Z0000 Encounter for general adult medical examination without abnormal findings: Secondary | ICD-10-CM

## 2017-12-29 DIAGNOSIS — Z23 Encounter for immunization: Secondary | ICD-10-CM

## 2017-12-29 NOTE — Assessment & Plan Note (Signed)
Pneumovax today  Pap smear to be performed at her upcoming appointment.

## 2017-12-29 NOTE — Assessment & Plan Note (Signed)
Continue Biktarvy. Reviewed labs with her today. Discussed U=U concept in addition to safe sex counseling and prevention of other STIs. She is very happy about regaining some weight back and proud of how she has done with her medications. Her husband is also a patient here on PrEP. I gave her her Phillips Odor today after the pharmacy sent this to our clinic. She will return in 3 months with labs 2 weeks before.

## 2017-12-29 NOTE — Progress Notes (Signed)
Michelle Waters [birth year]  [id number] PCP: Patient, No Pcp Per  Chief Complaint  Patient presents with  . Follow-up    Patient Active Problem List   Diagnosis Date Noted  . HIV disease ([place]) 10/05/2017  . Elevated serum creatinine 10/05/2017  . Healthcare maintenance 10/05/2017  . Bacterial infection of finger or toe 08/30/2017  . Smoker 01/17/2015  . Family history of diabetes mellitus (DM) 01/17/2015    Subjective:  Brief Narrative:  Michelle Waters is a 47 y.o. AA female with HIV infection. First diagnosed in 09/2017 with CD4 nadir 1080, VL 472. HIV Risk: heterosexual. OIs: none  Previous Regimens:   Biktarvy 2018 --> suppressed    Genotype:   09/2017 - K103R, V179D   HPI:  Michelle Waters is here today for routine HIV follow up care. She is feeling well today and is proud to report she has missed no doses of her Biktarvy and has been tolerating this very well without concern for side effects. She is currently sexually active with her husband whom is on PrEP and active patient with our clinic. Reports no complaints today suggestive of associated opportunistic infection or advancing HIV disease such as fevers, night sweats, weight loss, anorexia, cough, SOB, nausea, vomiting, diarrhea, headache, sensory changes, lymphadenopathy or oral thrush.   Still smoking about a pack per day of cigarettes. Not interested in quitting. Needs pap smear arranged. Has received flu shot this year.   Review of Systems  Constitutional: Negative for chills, fever, malaise/fatigue and weight loss.  HENT: Negative for sore throat.        No dental problems  Respiratory: Negative for cough and sputum production.   Cardiovascular: Negative for chest pain and leg swelling.  Gastrointestinal: Negative for abdominal pain, diarrhea and vomiting.  Genitourinary: Negative for dysuria and flank pain.  Musculoskeletal: Negative for joint pain, myalgias and neck pain.  Skin: Negative for rash.  Neurological:  Negative for dizziness, tingling and headaches.  Psychiatric/Behavioral: Negative for depression and substance abuse. The patient is nervous/anxious. The patient does not have insomnia.    Past Medical History:  Diagnosis Date  . Acid reflux   . Elevated serum creatinine 10/05/2017  . Hypertension    Outpatient Medications Prior to Visit  Medication Sig Dispense Refill  . acetaminophen (TYLENOL) 500 MG tablet Take 1,000 mg every 6 (six) hours as needed by mouth for moderate pain.    Marland Kitchen albuterol (PROVENTIL HFA;VENTOLIN HFA) 108 (90 Base) MCG/ACT inhaler Inhale 2 puffs into the lungs every 6 (six) hours as needed for wheezing or shortness of breath. 54 g 3  . aspirin EC 81 MG tablet Take 1 tablet (81 mg total) by mouth daily. 30 tablet 0  . bictegravir-emtricitabine-tenofovir AF (BIKTARVY) 50-200-25 MG TABS tablet Take 1 tablet by mouth daily. Try to take at the same time each day with or without food. 30 tablet 5  . BIKTARVY 50-200-25 MG TABS tablet TAKE 1 TABLET BY MOUTH DAILY. 30 tablet 5  . diphenhydrAMINE (BENADRYL) 25 mg capsule Take 25 mg every 6 (six) hours as needed by mouth for allergies.    Marland Kitchen ondansetron (ZOFRAN ODT) 4 MG disintegrating tablet Take 1 tablet (4 mg total) by mouth every 8 (eight) hours as needed for nausea or vomiting. 20 tablet 0  . oxyCODONE-acetaminophen (PERCOCET/ROXICET) 5-325 MG tablet Take 1-2 tablets every 4 (four) hours as needed by mouth for moderate pain or severe pain. 40 tablet 0  . tetrahydrozoline 0.05 % ophthalmic solution Place 1 drop  daily as needed into both eyes (dry itchy eyes).    Marland Kitchen omeprazole (PRILOSEC) 20 MG capsule TAKE 1 CAPSULE BY MOUTH DAILY BEFORE BREAKFAST. WAIT 30 MINUTES BEFORE EATING (Patient not taking: Reported on 08/31/2017) 30 capsule 0   No facility-administered medications prior to visit.    Allergies  Allergen Reactions  . Ampicillin Swelling and Rash  . Penicillins Swelling and Rash  . Shrimp [Shellfish Allergy] Swelling  .  Codeine Itching  . Hydrocodone Itching   Social History   Tobacco Use  . Smoking status: Current Every Day Smoker    Packs/day: 0.50  . Smokeless tobacco: Never Used  Substance Use Topics  . Alcohol use: Yes  . Drug use: No   Family History  Problem Relation Age of Onset  . Hypertension Mother     Objective:   Vitals:   12/29/17 1051  BP: (!) 152/86  Pulse: 69  Temp: 98.2 F (36.8 C)   Physical Exam  Constitutional: She is oriented to person, place, and time and well-developed, well-nourished, and in no distress.  HENT:  Mouth/Throat: No oral lesions. No dental abscesses.  Missing teeth.   Cardiovascular: Normal rate, regular rhythm and normal heart sounds.  Pulmonary/Chest: Effort normal and breath sounds normal.  Abdominal: Soft. She exhibits no distension. There is no tenderness.  Musculoskeletal: Normal range of motion. She exhibits no tenderness.  Lymphadenopathy:    She has no cervical adenopathy.  Neurological: She is alert and oriented to person, place, and time.  Skin: Skin is warm and dry. No rash noted.  Psychiatric: Mood, affect and judgment normal.   Vitals:   12/29/17 1051  BP: (!) 152/86  Pulse: 69  Temp: 98.2 F (36.8 C)   Lab Results Lab Results  Component Value Date   WBC 5.6 09/23/2017   HGB 12.7 09/23/2017   HCT 38.3 09/23/2017   MCV 85.3 09/23/2017   PLT 313 09/23/2017    Lab Results  Component Value Date   CREATININE 1.25 (H) 09/23/2017   BUN 13 09/23/2017   NA 131 (L) 09/23/2017   K 4.1 09/23/2017   CL 97 (L) 09/23/2017   CO2 22 09/23/2017    Lab Results  Component Value Date   ALT 16 09/23/2017   AST 25 09/23/2017   ALKPHOS 109 01/17/2015   BILITOT 0.6 09/23/2017    Lab Results  Component Value Date   CHOL 227 (H) 09/23/2017   HDL 97 09/23/2017   LDLCALC 96 11/02/2015   TRIG 135 09/23/2017   CHOLHDL 2.3 09/23/2017   HIV 1 RNA Quant  Date Value  11/17/2017 <20 NOT DETECTED copies/mL  09/23/2017 477 Copies/mL  (H)   CD4 T Cell Abs (/uL)  Date Value  09/23/2017 1,080   No results found for: HIV1GENOSEQ No results found for: HAV Lab Results  Component Value Date   HEPBSAG NON-REACTIVE 09/23/2017   HEPBSAB REACTIVE (A) 09/23/2017   Lab Results  Component Value Date   HCVAB NEGATIVE 07/29/2007   Lab Results  Component Value Date   CHLAMYDIAWP Negative 09/23/2017   N Negative 09/23/2017   No results found for: GCPROBEAPT No results found for: QUANTGOLD   Assessment and Plan: Problem List Items Addressed This Visit      Other   HIV disease ([place])    Continue Biktarvy. Reviewed labs with her today. Discussed U=U concept in addition to safe sex counseling and prevention of other STIs. She is very happy about regaining some weight back and proud of  how she has done with her medications. Her husband is also a patient here on PrEP. I gave her her Phillips Odor today after the pharmacy sent this to our clinic. She will return in 3 months with labs 2 weeks before.       Healthcare maintenance    Pneumovax today  Pap smear to be performed at her upcoming appointment.        Other Visit Diagnoses    HIV (human immunodeficiency virus infection) ([place])    -  Primary   Relevant Orders   HIV 1 RNA quant-no reflex-bld   T-helper cell (CD4)- ([facility] clinic only)   Need for pneumococcal vaccine       Relevant Orders   Pneumococcal polysaccharide vaccine 23-valent greater than or equal to 2yo subcutaneous/IM (Completed)     Michelle Madeira, MSN, NP-C [facility]  12/29/17 12:04 PM

## 2017-12-29 NOTE — Patient Instructions (Addendum)
Please return in 3 months for a routine follow up and a PAP smear (30 minute appointment)   We will get you to come in 2 weeks before for your labs.

## 2018-01-20 MED FILL — BIKTARVY 50-200-25 MG TABS: 50-200-25 | 30 days supply | Qty: 30 | Fill #1

## 2018-02-01 ENCOUNTER — Telehealth: Payer: Self-pay

## 2018-02-01 ENCOUNTER — Other Ambulatory Visit: Payer: Self-pay | Admitting: Pharmacist

## 2018-02-01 MED ORDER — MIRTAZAPINE 15 MG PO TABS: 15.0000 mg | ORAL_TABLET | Freq: Every day | ORAL | 1 refills | Status: DC

## 2018-02-01 MED ORDER — ENSURE PO LIQD: 237.0000 mL | Freq: Two times a day (BID) | ORAL | 6 refills | Status: DC

## 2018-02-01 NOTE — Addendum Note (Signed)
Addended by: Bosque Callas on: 02/01/2018 03:48 PM   Modules accepted: Orders

## 2018-02-01 NOTE — Telephone Encounter (Signed)
OK to send in prescription for Ensure BID between meals considering her low BMI. Not certain this will be covered with insurance but worth a try.   Please counsel her on separating Ensure's by at least 6 hours from her biktarvy since the minerals in Ensure can decrease how it is absorbed.   Will also trial mirtazapine 15 mg QHS for appetite support.   Please call patient to notify - thank you.

## 2018-02-01 NOTE — Telephone Encounter (Signed)
Patient called requesting Ensure or something to help her gain weight. Please advise. Pola Corn, LPN

## 2018-02-02 ENCOUNTER — Telehealth: Payer: Self-pay

## 2018-02-02 NOTE — Telephone Encounter (Signed)
Left message for patient to call office. Per this message from [facility].  OK to send in prescription for Ensure BID between meals considering her low BMI. Not certain this will be covered with insurance but worth a try.   Please counsel her on separating Ensure's by at least 6 hours from her biktarvy since the minerals in Ensure can decrease how it is absorbed.   Will also trial mirtazapine 15 mg QHS for appetite support.  Pola Corn, LPN

## 2018-02-22 MED FILL — BIKTARVY 50-200-25 MG TABS: 50-200-25 | 30 days supply | Qty: 30 | Fill #2

## 2018-02-24 ENCOUNTER — Telehealth: Payer: Self-pay

## 2018-02-24 NOTE — Telephone Encounter (Signed)
PT came in today requesting a letter for a letter stating that her dog is a therapy dog for diagnoses and "everything she is going though". Would like for Janene Madeira, NP to write this letter asap before May 31. Will relay this message to [place] per pt. [place]

## 2018-02-24 NOTE — Telephone Encounter (Signed)
Can we get her in to see Rollene Fare for this please?

## 2018-02-24 NOTE — Telephone Encounter (Signed)
Can we get her in to see Rollene Fare to help with this please? Thank you

## 2018-02-25 NOTE — Telephone Encounter (Signed)
Called pt regarding request for a letter stating that her dog is her Therapy dog for her new apartment. Colletta [place] requested the pt be seen by our [facility] before she can receive this letter. Pt is scheduled for 03/01/18 at [place], [place]

## 2018-03-01 ENCOUNTER — Institutional Professional Consult (permissible substitution): Payer: No Typology Code available for payment source | Admitting: Licensed Clinical Social Worker

## 2018-03-02 ENCOUNTER — Institutional Professional Consult (permissible substitution): Payer: No Typology Code available for payment source | Admitting: Licensed Clinical Social Worker

## 2018-03-03 ENCOUNTER — Ambulatory Visit (INDEPENDENT_AMBULATORY_CARE_PROVIDER_SITE_OTHER): Payer: No Typology Code available for payment source | Admitting: Licensed Clinical Social Worker

## 2018-03-03 DIAGNOSIS — F331 Major depressive disorder, recurrent, moderate: Secondary | ICD-10-CM | POA: Diagnosis not present

## 2018-03-03 NOTE — BH Specialist Note (Signed)
Integrated Behavioral Health Initial Visit  MRN: [id number] Name: Michelle Waters  Number of [place] Clinician visits:: 1/6 Session Start time: 8:40AM  Session End time: 9:37AM Total time: 1 hour  Type of Service: [place] Interpretor:No. Interpretor Name and Language: n/a   Warm Hand Off Completed.       SUBJECTIVE: Michelle Waters is a 47 y.o. female accompanied by self Patient was referred by Janene Madeira for depressive symptoms. Patient reports the following symptoms/concerns: stress over diagnosis and living situations, lack of energy, fatigue, wanting to be alone in the dark Duration of problem: 5 months, Severity of problem: moderate  OBJECTIVE: Mood: Depressed and Affect: Constricted Risk of harm to self or others: No plan to harm self or others  LIFE CONTEXT: Patient lives with husband and is in danger of losing her apartment. She reports that it is not a good environment and her ceiling is falling in places, but that the landlord is not going through the correct process of evicting her and claims it is because she has bedbugs. Patient has contacted legal aid about this matter. She reports a good relationship with husband and with parents and an uncle, who comprise her main support group. Patient's dog is also a major support for her and she is requesting a letter to certify the dog as an Chief Technology Officer. Patient states that in addition to being soothing when she is depressed or anxious, the dog ran to get her husband once when she was having a seizure and serves as her ears when there is someone at the door she cannot hear. She states that she would like to find a way to spend time with more people, especially people her age or older, as the people who live in her apartment complex are all younger. Patient has not worked in a while, but her husband works for [employer] and she reports that although finances are  tight she does not go without food or care of any sort. During the day, patient reports she stays home in bed and watches TV.    GOALS ADDRESSED: Patient will: 1. Reduce symptoms of: depression 2. Increase knowledge and/or ability of: self-care strategies    INTERVENTIONS: Interventions utilized: Motivational Interviewing and Supportive Counseling    ASSESSMENT: Patient currently experiencing depressed mood, anhedonia, hypersomnia, lack of energy and motivation, isolation, excessive feelings of guilt over diagnosis, anxiety. 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 The disorder is Recurrent and of Moderate severity. Counselor and patient discussed how patient's dog is an emotional support for her. Counselor provided patient with ESA prescription letter. Patient identified a goal of feeling better so that she can stay in bed less and spend more time connecting with others. Counselor guided patient to explore her desires for change and the degree to which she is willing/able to work toward those changes. Patient and counselor brainstormed ways to make small, immediate changes. Counselor commended patient's proactive treatment of rental situation and her adherence to HIV treatment.    Patient may benefit from ongoing counseling to include CBT and Reality Therapy.  PLAN: 1. Follow up with behavioral health clinician on : 03/29/18 at [place], LCSW

## 2018-03-08 ENCOUNTER — Telehealth: Payer: Self-pay | Admitting: Behavioral Health

## 2018-03-08 NOTE — Telephone Encounter (Signed)
Patient called stating she needs assistance with starting the process for applying for Disability. Patient states her vision continues to worsen and she cannot see at night.  Writer informed patient she would give her information to [place] from [place].  Patient verbalized understanding. Pricilla Riffle RN

## 2018-03-16 ENCOUNTER — Emergency Department (HOSPITAL_COMMUNITY)
Admission: EM | Admit: 2018-03-16 | Discharge: 2018-03-16 | Disposition: A | Discharge status: Home / Self Care | Payer: Self-pay | Attending: Emergency Medicine | Admitting: Emergency Medicine

## 2018-03-16 ENCOUNTER — Other Ambulatory Visit: Payer: Self-pay

## 2018-03-16 ENCOUNTER — Emergency Department (HOSPITAL_COMMUNITY): Payer: Self-pay

## 2018-03-16 ENCOUNTER — Encounter (HOSPITAL_COMMUNITY): Payer: Self-pay | Admitting: *Deleted

## 2018-03-16 DIAGNOSIS — K2900 Acute gastritis without bleeding: Secondary | ICD-10-CM | POA: Insufficient documentation

## 2018-03-16 DIAGNOSIS — R1011 Right upper quadrant pain: Secondary | ICD-10-CM

## 2018-03-16 DIAGNOSIS — Z7982 Long term (current) use of aspirin: Secondary | ICD-10-CM | POA: Insufficient documentation

## 2018-03-16 DIAGNOSIS — Z79899 Other long term (current) drug therapy: Secondary | ICD-10-CM | POA: Insufficient documentation

## 2018-03-16 DIAGNOSIS — I10 Essential (primary) hypertension: Secondary | ICD-10-CM | POA: Insufficient documentation

## 2018-03-16 DIAGNOSIS — F17210 Nicotine dependence, cigarettes, uncomplicated: Secondary | ICD-10-CM | POA: Insufficient documentation

## 2018-03-16 DIAGNOSIS — Z21 Asymptomatic human immunodeficiency virus [HIV] infection status: Secondary | ICD-10-CM | POA: Insufficient documentation

## 2018-03-16 HISTORY — DX: Human immunodeficiency virus (HIV) disease: B20

## 2018-03-16 HISTORY — DX: Asymptomatic human immunodeficiency virus (hiv) infection status: Z21

## 2018-03-16 LAB — COMPREHENSIVE METABOLIC PANEL
ALBUMIN: 3.4 g/dL — AB (ref 3.5–5.0)
ALK PHOS: 98 U/L (ref 38–126)
ALT: 19 U/L (ref 14–54)
AST: 32 U/L (ref 15–41)
Anion gap: 8 (ref 5–15)
BUN: 7 mg/dL (ref 6–20)
CHLORIDE: 104 mmol/L (ref 101–111)
CO2: 26 mmol/L (ref 22–32)
CREATININE: 0.92 mg/dL (ref 0.44–1.00)
Calcium: 9.3 mg/dL (ref 8.9–10.3)
GFR calc non Af Amer: >60 mL/min (ref >60)
GLUCOSE: 94 mg/dL (ref 65–99)
Potassium: 4.1 mmol/L (ref 3.5–5.1)
Sodium: 138 mmol/L (ref 135–145)
Total Bilirubin: 0.6 mg/dL (ref 0.3–1.2)
Total Protein: 7.4 g/dL (ref 6.5–8.1)

## 2018-03-16 LAB — URINALYSIS, ROUTINE W REFLEX MICROSCOPIC
BILIRUBIN URINE: NEGATIVE
GLUCOSE, UA: NEGATIVE mg/dL
HGB URINE DIPSTICK: NEGATIVE
Ketones, ur: NEGATIVE mg/dL
Leukocytes, UA: NEGATIVE
Nitrite: NEGATIVE
PROTEIN: NEGATIVE mg/dL
Specific Gravity, Urine: 1.009 (ref 1.005–1.030)
pH: 7 (ref 5.0–8.0)

## 2018-03-16 LAB — LIPASE, BLOOD: LIPASE: 30 U/L (ref 11–51)

## 2018-03-16 LAB — CBC
HCT: 36.6 % (ref 36.0–46.0)
Hemoglobin: 11.8 g/dL — ABNORMAL LOW (ref 12.0–15.0)
MCH: 29.4 pg (ref 26.0–34.0)
MCHC: 32.2 g/dL (ref 30.0–36.0)
MCV: 91.3 fL (ref 78.0–100.0)
PLATELETS: 256 10*3/uL (ref 150–400)
RBC: 4.01 MIL/uL (ref 3.87–5.11)
RDW: 12.3 % (ref 11.5–15.5)
WBC: 9 10*3/uL (ref 4.0–10.5)

## 2018-03-16 LAB — I-STAT TROPONIN, ED
TROPONIN I, POC: 0.01 ng/mL (ref 0.00–0.08)
Troponin i, poc: 0 ng/mL (ref 0.00–0.08)

## 2018-03-16 LAB — I-STAT BETA HCG BLOOD, ED (MC, WL, AP ONLY): I-stat hCG, quantitative: 5.5 m[IU]/mL — ABNORMAL HIGH (ref <5)

## 2018-03-16 MED ORDER — RANITIDINE HCL 150 MG PO CAPS: 150.0000 mg | ORAL_CAPSULE | Freq: Every day | ORAL | 0 refills | Status: DC

## 2018-03-16 MED ORDER — GI COCKTAIL ~~LOC~~
30.0000 mL | Freq: Once | ORAL | Status: AC
  Administered 2018-03-16: 30 mL via ORAL
  Filled 2018-03-16: qty 30

## 2018-03-16 NOTE — ED Notes (Signed)
Patient ambulatory to bathroom with steady gait at this time, then transported to [place]

## 2018-03-16 NOTE — ED Notes (Signed)
Patient verbalizes understanding of discharge instructions. Opportunity for questioning and answers were provided. Armband removed by staff, pt discharged from ED ambualtory.  

## 2018-03-16 NOTE — ED Triage Notes (Signed)
Pt in c/o epigastric pain after eating x 2 days, pt reports no relief with indigestion pills, pt denies n/v/d, pt denies SOB, pt A&O x4

## 2018-03-16 NOTE — ED Provider Notes (Signed)
[facility] EMERGENCY DEPARTMENT Provider Note   CSN: [id number] Arrival date & time: 03/16/18  0815     History   Chief Complaint Chief Complaint  Patient presents with  . Abdominal Pain    HPI Michelle Waters is a 47 y.o. female.  HPI   47 year old female presents today with complaints of epigastric abdominal pain.  Patient notes the last 2 days she has had epigastric abdominal pain worse with eating radiation up into her throat.  Patient has breath, denies any lower abdominal pain.  Patient notes the pain also comes without eating.  Patient notes taking over-the-counter indigestion pills without significant improvement.  She also notes she takes Prilosec which has not helped either.  She notes that she has been significantly stressed recently, notes she takes aspirin, drinks alcohol, and is also eating spicy foods.  She denies any blood in her stool, dark stools, she denies any vomiting.  Past Medical History:  Diagnosis Date  . Acid reflux   . Elevated serum creatinine 10/05/2017  . HIV (human immunodeficiency virus infection) ([place])   . Hypertension     Patient Active Problem List   Diagnosis Date Noted  . HIV disease ([place]) 10/05/2017  . Elevated serum creatinine 10/05/2017  . Healthcare maintenance 10/05/2017  . Bacterial infection of finger or toe 08/30/2017  . Smoker 01/17/2015  . Family history of diabetes mellitus (DM) 01/17/2015    Past Surgical History:  Procedure Laterality Date  . I&D EXTREMITY Right 08/30/2017   Procedure: IRRIGATION AND DEBRIDEMENT INDEX FINGER;  Surgeon: Netta Cedars, MD;  Location: [facility];  Service: Orthopedics;  Laterality: Right;     OB History   None      Home Medications    Prior to Admission medications   Medication Sig Start Date End Date Taking? Authorizing Provider  acetaminophen (TYLENOL) 500 MG tablet Take 1,000 mg every 6 (six) hours as needed by mouth for moderate pain.    [provider]   albuterol (PROVENTIL HFA;VENTOLIN HFA) 108 (90 Base) MCG/ACT inhaler Inhale 2 puffs into the lungs every 6 (six) hours as needed for wheezing or shortness of breath. 11/21/15   Tresa Garter, MD  aspirin EC 81 MG tablet Take 1 tablet (81 mg total) by mouth daily. 10/05/17   [place] Callas, NP  bictegravir-emtricitabine-tenofovir AF (BIKTARVY) 50-200-25 MG TABS tablet Take 1 tablet by mouth daily. Try to take at the same time each day with or without food. 11/17/17   Kuppelweiser, Cassie L, RPH-CPP  BIKTARVY 50-200-25 MG TABS tablet TAKE 1 TABLET BY MOUTH DAILY. 12/21/17   [place] Callas, NP  diphenhydrAMINE (BENADRYL) 25 mg capsule Take 25 mg every 6 (six) hours as needed by mouth for allergies.    [provider]  ENSURE (ENSURE) Take 237 mLs by mouth 2 (two) times daily between meals. 02/01/18   [place] Callas, NP  mirtazapine (REMERON) 15 MG tablet Take 1 tablet (15 mg total) by mouth at bedtime. 02/01/18   [place] Callas, NP  omeprazole (PRILOSEC) 20 MG capsule TAKE 1 CAPSULE BY MOUTH DAILY BEFORE BREAKFAST. WAIT 30 MINUTES BEFORE EATING Patient not taking: Reported on 08/31/2017 08/13/16   Tresa Garter, MD  ondansetron (ZOFRAN ODT) 4 MG disintegrating tablet Take 1 tablet (4 mg total) by mouth every 8 (eight) hours as needed for nausea or vomiting. 11/17/17   Kuppelweiser, Cassie L, RPH-CPP  oxyCODONE-acetaminophen (PERCOCET/ROXICET) 5-325 MG tablet Take 1-2 tablets every 4 (four) hours  as needed by mouth for moderate pain or severe pain. 09/01/17   Dixon, Robb Matar, PA-C  ranitidine (ZANTAC) 150 MG capsule Take 1 capsule (150 mg total) by mouth daily. 03/16/18   Elaynah Virginia, Dellis Filbert, PA-C  tetrahydrozoline 0.05 % ophthalmic solution Place 1 drop daily as needed into both eyes (dry itchy eyes).    [provider]    Family History Family History  Problem Relation Age of Onset  . Hypertension Mother     Social History Social History   Tobacco Use    . Smoking status: Current Every Day Smoker    Packs/day: 0.50  . Smokeless tobacco: Never Used  Substance Use Topics  . Alcohol use: Yes  . Drug use: No     Allergies   Ampicillin; Penicillins; Shrimp [shellfish allergy]; Codeine; and Hydrocodone   Review of Systems Review of Systems  All other systems reviewed and are negative.    Physical Exam Updated Vital Signs BP 104/90 (BP Location: Right Arm)   Pulse 77   Temp 98.7 F (37.1 C) (Oral)   Resp 18   Ht 4\' 11"  (1.499 m)   Wt 54.4 kg (120 lb)   SpO2 100%   BMI 24.24 kg/m   Physical Exam  Constitutional: She is oriented to person, place, and time. She appears well-developed and well-nourished.  HENT:  Head: Normocephalic and atraumatic.  Eyes: Pupils are equal, round, and reactive to light. Conjunctivae are normal. Right eye exhibits no discharge. Left eye exhibits no discharge. No scleral icterus.  Neck: Normal range of motion. No JVD present. No tracheal deviation present.  Pulmonary/Chest: Effort normal. No stridor.  Abdominal:  Minor tenderness palpation right upper quadrant and epigastric region, no lower abdominal tenderness palpation  Neurological: She is alert and oriented to person, place, and time. Coordination normal.  Psychiatric: She has a normal mood and affect. Her behavior is normal. Judgment and thought content normal.  Nursing note and vitals reviewed.   ED Treatments / Results  Labs (all labs ordered are listed, but only abnormal results are displayed) Labs Reviewed  COMPREHENSIVE METABOLIC PANEL - Abnormal; Notable for the following components:      Result Value   Albumin 3.4 (*)    All other components within normal limits  CBC - Abnormal; Notable for the following components:   Hemoglobin 11.8 (*)    All other components within normal limits  URINALYSIS, ROUTINE W REFLEX MICROSCOPIC - Abnormal; Notable for the following components:   Color, Urine STRAW (*)    All other components  within normal limits  I-STAT BETA HCG BLOOD, ED ([facility], [facility], AP ONLY) - Abnormal; Notable for the following components:   I-stat hCG, quantitative 5.5 (*)    All other components within normal limits  LIPASE, BLOOD  I-STAT TROPONIN, ED  I-STAT TROPONIN, ED  I-STAT TROPONIN, ED    EKG None  Radiology US Abdomen Limited Ruq  Result Date: 03/16/2018 CLINICAL DATA:  Right upper quadrant pain for 2 days EXAM: ULTRASOUND ABDOMEN LIMITED RIGHT UPPER QUADRANT COMPARISON:  None. FINDINGS: Gallbladder: The gallbladder is visualized and no gallstones are noted. There is no pain over the gallbladder with compression. Common bile duct: Diameter: The common bile duct is normal measuring 5.5 mm in diameter. Liver: The parenchyma of the liver is normal in echogenicity. No focal hepatic abnormality is seen. Portal vein is patent on color Doppler imaging with normal direction of blood flow towards the liver. IMPRESSION: Negative limited ultrasound of the right upper  quadrant. Electronically Signed   By: Ivar Drape M.D.   On: 03/16/2018 14:17    Procedures Procedures (including critical care time)  Medications Ordered in ED Medications  gi cocktail (Maalox,Lidocaine,Donnatal) (30 mLs Oral Given 03/16/18 1250)     Initial Impression / Assessment and Plan / ED Course  I have reviewed the triage vital signs and the nursing notes.  Pertinent labs & imaging results that were available during my care of the patient were reviewed by me and considered in my medical decision making (see chart for details).     47 year old female presents today with epigastric pain likely secondary to gastritis.  She has normal ultrasound no signs of cholecystitis, cholelithiasis.  Patient with no lower abdominal pain.  Patient has significant risk factors including poor diet, alcohol, aspirin use.  Patient counseled on symptomatic care, encouraged to use Zantac, follow-up as an outpatient with primary care for ongoing evaluation  and management.  She is given strict return precautions.  She verbalized understanding and agreement to today's plan.  Final Clinical Impressions(s) / ED Diagnoses   Final diagnoses:  RUQ abdominal pain  Acute gastritis, presence of bleeding unspecified, unspecified gastritis type    ED Discharge Orders        Ordered    ranitidine (ZANTAC) 150 MG capsule  Daily     03/16/18 1448       Okey Regal, PA-C 03/16/18 1456    Valarie Merino, MD 03/16/18 2250

## 2018-03-16 NOTE — Discharge Instructions (Addendum)
Please read attached information. If you experience any new or worsening signs or symptoms please return to the emergency room for evaluation. Please follow-up with your primary care provider or specialist as discussed. Please use medication prescribed only as directed and discontinue taking if you have any concerning signs or symptoms.   °

## 2018-03-29 ENCOUNTER — Other Ambulatory Visit: Payer: Self-pay

## 2018-03-29 ENCOUNTER — Ambulatory Visit (INDEPENDENT_AMBULATORY_CARE_PROVIDER_SITE_OTHER): Payer: Self-pay | Admitting: Licensed Clinical Social Worker

## 2018-03-29 DIAGNOSIS — F331 Major depressive disorder, recurrent, moderate: Secondary | ICD-10-CM

## 2018-03-29 DIAGNOSIS — B20 Human immunodeficiency virus [HIV] disease: Secondary | ICD-10-CM

## 2018-03-29 MED FILL — BIKTARVY 50-200-25 MG TABS: 50-200-25 | 30 days supply | Qty: 30 | Fill #3

## 2018-03-29 NOTE — BH Specialist Note (Signed)
Integrated Behavioral Health Follow Up Visit  MRN: [id number] Name: CALVIN CHURA  Number of [place] Clinician visits: 2/6 Session Start time: 9:08am Session End time: 9:26am Total time: 20 minutes  Type of Service: [place] Interpretor:No. Interpretor Name and Language: n/a  SUBJECTIVE: Michelle Waters is a 47 y.o. female referred for depressive symptoms.. Patient reports the following symptoms/concerns: feeling down, anxiety, getting angry easily, "stressed out".  OBJECTIVE: Mood: Anxious and Affect: Constricted Risk of harm to self or others: No plan to harm self or others  LIFE CONTEXT: Patient reports that she is still in pretty much the same situation as at the last session. She and husband are to be out of their apartment by Saturday (6/15), but have been advised by their attorney to go to the landlord on 6/13 and request more time or help finding a place to live. In case this is not granted, patient's in-laws have put them in contact with a family friend who may have a place for them to rent.   GOALS ADDRESSED: Patient will: 1.  Reduce symptoms of: depression   INTERVENTIONS: Interventions utilized:  Solution-Focused Strategies, Mindfulness or Relaxation Training and Supportive Counseling  ASSESSMENT: Patient currently experiencing depressed mood, low motivation and energy, lack of interest/anhedonia, feeling isolated, anxiety and hypersomnia. She exhibits quite a bit of anxiety in session, both over her housing situation and the fact that she has to have bloodwork done today. Patient seems very distracted. Counselor guided patient in relaxation practices and encouraged her to practice breathing when she is overcome with anxiety and worry about her living situation and also while she has her blood drawn.     Patient may benefit from ongoing counseling sessions of CBT to address depression and anxiety.  PLAN: 1.  Schedule follow up appointment with front desk staff after lab appointment  Lillie Fragmin, LCSW

## 2018-03-30 LAB — T-HELPER CELL (CD4) - (RCID CLINIC ONLY)
CD4 % Helper T Cell: 39 % (ref 33–55)
CD4 T CELL ABS: 720 /uL (ref 400–2700)

## 2018-03-31 LAB — HIV-1 RNA QUANT-NO REFLEX-BLD
HIV 1 RNA Quant: <20 copies/mL
HIV-1 RNA Quant, Log: <1.3 Log copies/mL

## 2018-04-12 ENCOUNTER — Ambulatory Visit: Payer: No Typology Code available for payment source | Admitting: Infectious Diseases

## 2018-04-16 ENCOUNTER — Encounter: Payer: Self-pay | Admitting: Infectious Diseases

## 2018-04-16 ENCOUNTER — Ambulatory Visit (INDEPENDENT_AMBULATORY_CARE_PROVIDER_SITE_OTHER): Payer: Self-pay | Admitting: Infectious Diseases

## 2018-04-16 ENCOUNTER — Other Ambulatory Visit: Payer: Self-pay

## 2018-04-16 VITALS — BP 126/87 | HR 80 | Temp 98.6°F | Wt 115.0 lb

## 2018-04-16 DIAGNOSIS — Z21 Asymptomatic human immunodeficiency virus [HIV] infection status: Secondary | ICD-10-CM

## 2018-04-16 DIAGNOSIS — Z23 Encounter for immunization: Secondary | ICD-10-CM

## 2018-04-16 DIAGNOSIS — Z0000 Encounter for general adult medical examination without abnormal findings: Secondary | ICD-10-CM

## 2018-04-16 DIAGNOSIS — F17200 Nicotine dependence, unspecified, uncomplicated: Secondary | ICD-10-CM

## 2018-04-16 DIAGNOSIS — Z1231 Encounter for screening mammogram for malignant neoplasm of breast: Secondary | ICD-10-CM

## 2018-04-16 DIAGNOSIS — Z124 Encounter for screening for malignant neoplasm of cervix: Secondary | ICD-10-CM

## 2018-04-16 DIAGNOSIS — B20 Human immunodeficiency virus [HIV] disease: Secondary | ICD-10-CM

## 2018-04-16 NOTE — Assessment & Plan Note (Signed)
Contemplative and actively trying to reduce use. Encouraged and supported today.

## 2018-04-16 NOTE — Patient Instructions (Addendum)
Wonderful to see you today. Keep taking your biktarvy once a day.   U=U HIV Campaign   Please call the [facility] to set up your mammogram.  [address], [place], [place] [id number]  We would like to see you back in 4 months for a doctor visit with labs prior to your visit please.   Please come back in 2 months for a nurse visit to get your second dose of the meningitis vaccine.    Meningococcal Diphtheria Toxoid Conjugate Vaccine What is this medicine? MENINGOCOCCAL DIPHTHERIA TOXOID CONJUGATE VACCINE (muh ning goh KOK kal dif THEER ee uh TOK soid KON juh geyt vak SEEN) is a vaccine to protect from bacterial meningitis. This vaccine does not contain live bacteria. It will not cause a meningitis. This medicine may be used for other purposes; ask your health care provider or pharmacist if you have questions. COMMON BRAND NAME(S): Menactra, Menveo What should I tell my health care provider before I take this medicine? They need to know if you have any of these conditions: -bleeding disorder -fever or infection -history of Guillain-Barre syndrome -immune system problems -an unusual or allergic reaction to diphtheria toxoid, meningococcal vaccine, latex, other medicines, foods, dyes, or preservatives -pregnant or trying to get pregnant -breast-feeding How should I use this medicine? This medicine is for injection into a muscle. It is given by a health care professional in a hospital or clinic setting. A copy of Vaccine Information Statements will be given before each vaccination. Read this sheet carefully each time. The sheet may change frequently. Talk to your pediatrician regarding the use of this medicine in children. While some brands of this drug may be prescribed for children as young as 8 months of age for selected conditions, precautions do apply. Overdosage: If you think you have taken too much of this medicine contact a poison control  center or emergency room at once. NOTE: This medicine is only for you. Do not share this medicine with others. What if I miss a dose? This does not apply. What may interact with this medicine? -adalimumab -anakinra -infliximab -medicines for organ transplant -medicines to treat cancer -medicines used during some procedures to diagnose a medical condition -other vaccines -some medicines for arthritis -steroid medicines like prednisone or cortisone This list may not describe all possible interactions. Give your health care provider a list of all the medicines, herbs, non-prescription drugs, or dietary supplements you use. Also tell them if you smoke, drink alcohol, or use illegal drugs. Some items may interact with your medicine. What should I watch for while using this medicine? Report any side effects that are worrisome to your doctor right away. Call your doctor if you have any unusual symptoms within 6 weeks of getting this vaccine. This vaccine may not protect from all meningitis infections. Women should inform their doctor if they wish to become pregnant or think they might be pregnant. Talk to your health care professional or pharmacist for more information. What side effects may I notice from receiving this medicine? Side effects that you should report to your doctor or health care professional as soon as possible: -allergic reactions like skin rash, itching or hives, swelling of the face, lips, or tongue -breathing problems -feeling faint or lightheaded, falls -fever over 102 degrees F -muscle weakness -unusual drooping or paralysis of face Side effects that usually do not require medical attention (report to your doctor or health care professional if they continue or are  bothersome): -chills -diarrhea -headache -loss of appetite -muscle aches and pains -pain at site where injected -tired This list may not describe all possible side effects. Call your doctor for medical  advice about side effects. You may report side effects to FDA at 1-800-FDA-1088. Where should I keep my medicine? This drug is given in a hospital or clinic and will not be stored at home. NOTE: This sheet is a summary. It may not cover all possible information. If you have questions about this medicine, talk to your doctor, pharmacist, or health care provider.  2018 Elsevier/Gold Standard (2010-02-26 21:41:10)

## 2018-04-16 NOTE — Assessment & Plan Note (Signed)
Pap smear today. Referral for mammogram and scholarship fund Menveo #1 today - #2 in 2 months with RN visit.  Will need colonoscopy next year.

## 2018-04-16 NOTE — Progress Notes (Signed)
Michelle Waters [birth year]  [id number] PCP: Patient, No Pcp Per  Patient Active Problem List   Diagnosis Date Noted  . HIV disease ([place]) 10/05/2017  . Elevated serum creatinine 10/05/2017  . Healthcare maintenance 10/05/2017  . Smoker 01/17/2015  . Family history of diabetes mellitus (DM) 01/17/2015    Subjective:  Brief Narrative:  Michelle Waters is a 47 y.o. AA female with HIV infection. First diagnosed in 09/2017 with CD4 nadir 1080, VL 472. HIV Risk: heterosexual. OIs: none  Previous Regimens:   Biktarvy 2018 --> suppressed    Genotype:   09/2017 - K103R, V179D   Chief Complaint  Patient presents with  . Follow-up    pap today    HPI:  Michelle Waters is here today for routine HIV follow up care. Michelle Waters has lost 5# Michelle Waters feels is d/t stress from neighbors. Otherwise Michelle Waters feels very well and has not missed any doses of her Biktarvy. Michelle Waters has been tolerating this very well without concern for side effects. Reports no complaints today suggestive of associated opportunistic infection or advancing HIV disease such as fevers, night sweats, weight loss, anorexia, cough, SOB, nausea, vomiting, diarrhea, headache, sensory changes, lymphadenopathy or oral thrush. Michelle Waters is currently sexually active with her husband whom is on PrEP and active patient with our clinic. No pain with sex but overall Michelle Waters does not like it. No vaginal discharge or itching. Michelle Waters last had a pap smear 3 years ago and it was normal. Michelle Waters has never had a mammogram.   Michelle Waters has not had any cigarettes in 2 days. Would like to quit but not sure Michelle Waters is ready.   Review of Systems  Constitutional: Negative for chills, fever, malaise/fatigue and weight loss.  HENT: Negative for sore throat.        No dental problems  Respiratory: Negative for cough and sputum production.   Cardiovascular: Negative for chest pain and leg swelling.  Gastrointestinal: Negative for abdominal pain, diarrhea and vomiting.  Genitourinary: Negative for dysuria  and flank pain.  Musculoskeletal: Negative for joint pain, myalgias and neck pain.  Skin: Negative for rash.  Neurological: Negative for dizziness, tingling and headaches.  Psychiatric/Behavioral: Negative for depression and substance abuse. The patient is nervous/anxious. The patient does not have insomnia.    Past Medical History:  Diagnosis Date  . Acid reflux   . Elevated serum creatinine 10/05/2017  . HIV (human immunodeficiency virus infection) ([place])   . Hypertension    Outpatient Medications Prior to Visit  Medication Sig Dispense Refill  . acetaminophen (TYLENOL) 500 MG tablet Take 1,000 mg every 6 (six) hours as needed by mouth for moderate pain.    Marland Kitchen albuterol (PROVENTIL HFA;VENTOLIN HFA) 108 (90 Base) MCG/ACT inhaler Inhale 2 puffs into the lungs every 6 (six) hours as needed for wheezing or shortness of breath. 54 g 3  . aspirin EC 81 MG tablet Take 1 tablet (81 mg total) by mouth daily. 30 tablet 0  . bictegravir-emtricitabine-tenofovir AF (BIKTARVY) 50-200-25 MG TABS tablet Take 1 tablet by mouth daily. Try to take at the same time each day with or without food. 30 tablet 5  . diphenhydrAMINE (BENADRYL) 25 mg capsule Take 25 mg every 6 (six) hours as needed by mouth for allergies.    Marland Kitchen ENSURE (ENSURE) Take 237 mLs by mouth 2 (two) times daily between meals. 237 mL 6  . mirtazapine (REMERON) 15 MG tablet Take 1 tablet (15 mg total) by mouth at bedtime. 30 tablet 1  .  omeprazole (PRILOSEC) 20 MG capsule TAKE 1 CAPSULE BY MOUTH DAILY BEFORE BREAKFAST. WAIT 30 MINUTES BEFORE EATING 30 capsule 0  . ondansetron (ZOFRAN ODT) 4 MG disintegrating tablet Take 1 tablet (4 mg total) by mouth every 8 (eight) hours as needed for nausea or vomiting. 20 tablet 0  . ranitidine (ZANTAC) 150 MG capsule Take 1 capsule (150 mg total) by mouth daily. 30 capsule 0  . tetrahydrozoline 0.05 % ophthalmic solution Place 1 drop daily as needed into both eyes (dry itchy eyes).    Marland Kitchen BIKTARVY 50-200-25 MG  TABS tablet TAKE 1 TABLET BY MOUTH DAILY. 30 tablet 5  . oxyCODONE-acetaminophen (PERCOCET/ROXICET) 5-325 MG tablet Take 1-2 tablets every 4 (four) hours as needed by mouth for moderate pain or severe pain. 40 tablet 0   No facility-administered medications prior to visit.    Allergies  Allergen Reactions  . Ampicillin Swelling and Rash  . Penicillins Swelling and Rash  . Shrimp [Shellfish Allergy] Swelling  . Codeine Itching  . Hydrocodone Itching   Social History   Tobacco Use  . Smoking status: Current Every Day Smoker    Packs/day: 0.50  . Smokeless tobacco: Never Used  Substance Use Topics  . Alcohol use: Yes  . Drug use: No   Family History  Problem Relation Age of Onset  . Hypertension Mother   . Diabetes Mother   . Lung cancer Maternal Grandmother   . Lung cancer Maternal Uncle     Objective:   Vitals:   04/16/18 1108  BP: 126/87  Pulse: 80  Temp: 98.6 F (37 C)   Physical Exam  Constitutional: Michelle Waters is oriented to person, place, and time and well-developed, well-nourished, and in no distress.  HENT:  Mouth/Throat: No oral lesions. No dental abscesses.  Missing teeth.   Cardiovascular: Normal rate, regular rhythm and normal heart sounds.  Pulmonary/Chest: Effort normal and breath sounds normal.  Abdominal: Soft. Bowel sounds are normal. Michelle Waters exhibits no distension. There is no tenderness.  Genitourinary: Uterus normal and vulva normal. Cervix exhibits no motion tenderness, no lesion and no tenderness. Right adnexum displays no mass and no tenderness. Left adnexum displays no mass and no tenderness. Vagina exhibits normal mucosa and no lesion. Thick  odorless  white and vaginal discharge found.  Musculoskeletal: Normal range of motion. Michelle Waters exhibits no tenderness.  Lymphadenopathy:    Michelle Waters has no cervical adenopathy.  Neurological: Michelle Waters is alert and oriented to person, place, and time.  Skin: Skin is warm and dry. No rash noted.  Psychiatric: Mood, affect and  judgment normal.   Vitals:   04/16/18 1108  BP: 126/87  Pulse: 80  Temp: 98.6 F (37 C)   Lab Results Lab Results  Component Value Date   WBC 9.0 03/16/2018   HGB 11.8 (L) 03/16/2018   HCT 36.6 03/16/2018   MCV 91.3 03/16/2018   PLT 256 03/16/2018    Lab Results  Component Value Date   CREATININE 0.92 03/16/2018   BUN 7 03/16/2018   NA 138 03/16/2018   K 4.1 03/16/2018   CL 104 03/16/2018   CO2 26 03/16/2018    Lab Results  Component Value Date   ALT 19 03/16/2018   AST 32 03/16/2018   ALKPHOS 98 03/16/2018   BILITOT 0.6 03/16/2018    Lab Results  Component Value Date   CHOL 227 (H) 09/23/2017   HDL 97 09/23/2017   LDLCALC 105 (H) 09/23/2017   TRIG 135 09/23/2017   CHOLHDL 2.3 09/23/2017  HIV 1 RNA Quant  Date Value  03/29/2018 <20 NOT DETECTED copies/mL  11/17/2017 <20 NOT DETECTED copies/mL  09/23/2017 477 Copies/mL (H)   CD4 T Cell Abs (/uL)  Date Value  03/29/2018 720  09/23/2017 1,080   No results found for: HIV1GENOSEQ Lab Results  Component Value Date   HAV REACTIVE (A) 09/23/2017   Lab Results  Component Value Date   HEPBSAG NON-REACTIVE 09/23/2017   HEPBSAB REACTIVE (A) 09/23/2017   Lab Results  Component Value Date   HCVAB NEGATIVE 07/29/2007   Lab Results  Component Value Date   CHLAMYDIAWP Negative 09/23/2017   N Negative 09/23/2017   No results found for: GCPROBEAPT No results found for: QUANTGOLD   Assessment and Plan: Problem List Items Addressed This Visit      Other   Smoker    Contemplative and actively trying to reduce use. Encouraged and supported today.       HIV disease ([place])    Continue biktarvy daily. Michelle Waters has done very well and has been suppressed 5 mo on this. Discussed U=U concept in addition to safe sex counseling and prevention of other STIs as well as discussed decision between Michelle Waters and her husband to continue PrEP. Michelle Waters can come back in 4 months for follow up.       Relevant Orders   MENINGOCOCCAL  MCV4O(MENVEO) (Completed)   Healthcare maintenance    Pap smear today. Referral for mammogram and scholarship fund Menveo #1 today - #2 in 2 months with RN visit.  Will need colonoscopy next year.        Other Visit Diagnoses    Screening for cervical cancer    -  Primary   Relevant Orders   Cytology - PAP   Breast cancer screening by mammogram       Relevant Orders   MM DIGITAL SCREENING BILATERAL   Asymptomatic HIV infection ([place])       Relevant Orders   HIV 1 RNA quant-no reflex-bld   MENINGOCOCCAL MCV4O(MENVEO) (Completed)   Need for meningitis vaccination       Relevant Orders   MENINGOCOCCAL MCV4O(MENVEO) (Completed)     Janene Madeira, MSN, NP-C [place] [facility]  04/16/18 4:20 PM

## 2018-04-16 NOTE — Assessment & Plan Note (Signed)
Continue biktarvy daily. She has done very well and has been suppressed 5 mo on this. Discussed U=U concept in addition to safe sex counseling and prevention of other STIs as well as discussed decision between she and her husband to continue PrEP. She can come back in 4 months for follow up.

## 2018-04-21 LAB — CYTOLOGY - PAP
Chlamydia: NEGATIVE
HPV (WINDOPATH): DETECTED — AB
HPV 16/18/45 genotyping: NEGATIVE
NEISSERIA GONORRHEA: NEGATIVE

## 2018-04-26 MED FILL — BIKTARVY 50-200-25 MG TABS: 50-200-25 | 30 days supply | Qty: 30 | Fill #4

## 2018-04-26 NOTE — Progress Notes (Signed)
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 She is in agreement.  Please fax these results and referral request for colposcopy to [facility] at [phone]. Please also provide them with a good callback # to our clinic team in case they need to call back to discuss.  Thank you!

## 2018-04-28 NOTE — Progress Notes (Signed)
Sent to referral coordinator  

## 2018-05-03 ENCOUNTER — Telehealth: Payer: Self-pay | Admitting: Behavioral Health

## 2018-05-03 DIAGNOSIS — B977 Papillomavirus as the cause of diseases classified elsewhere: Secondary | ICD-10-CM

## 2018-05-03 DIAGNOSIS — Z0000 Encounter for general adult medical examination without abnormal findings: Secondary | ICD-10-CM

## 2018-05-03 DIAGNOSIS — R87612 Low grade squamous intraepithelial lesion on cytologic smear of cervix (LGSIL): Secondary | ICD-10-CM

## 2018-05-03 NOTE — Telephone Encounter (Signed)
Thank you for letting me know - I will correct my referral reference and will do this with future referrals. Thank you!

## 2018-05-03 NOTE — Telephone Encounter (Signed)
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 She is in agreement.  Please fax these results and referral request for colposcopy to [facility] at [phone]. Please also provide them with a good callback # to our clinic team in case they need to call back to discuss.  Thank you!

## 2018-05-03 NOTE — Telephone Encounter (Signed)
[facility] clinic was told to place the referral electronically through epic.  Referral ordered.  Faxed over lab results per Janene Madeira NP.   Pricilla Riffle RN

## 2018-05-24 MED FILL — BIKTARVY 50-200-25 MG TABS: 50-200-25 | 30 days supply | Qty: 30 | Fill #5

## 2018-06-08 ENCOUNTER — Ambulatory Visit (HOSPITAL_COMMUNITY): Payer: Self-pay

## 2018-06-15 ENCOUNTER — Other Ambulatory Visit: Payer: Self-pay | Admitting: Infectious Diseases

## 2018-06-16 ENCOUNTER — Ambulatory Visit (INDEPENDENT_AMBULATORY_CARE_PROVIDER_SITE_OTHER): Payer: Self-pay

## 2018-06-16 DIAGNOSIS — B20 Human immunodeficiency virus [HIV] disease: Secondary | ICD-10-CM

## 2018-06-16 DIAGNOSIS — Z23 Encounter for immunization: Secondary | ICD-10-CM

## 2018-06-16 NOTE — Progress Notes (Signed)
Ms. Surowiec came in today for Meningococcal vaccine number 2/2 per Janene Madeira NP. Patient tolerated vaccine well.   Gery Pray, [place]

## 2018-06-23 ENCOUNTER — Ambulatory Visit: Payer: Self-pay

## 2018-06-23 ENCOUNTER — Ambulatory Visit: Payer: Self-pay | Admitting: Obstetrics & Gynecology

## 2018-06-23 MED FILL — BIKTARVY 50-200-25 MG TABS: 50-200-25 | 30 days supply | Qty: 30 | Fill #0

## 2018-07-08 ENCOUNTER — Encounter: Payer: Self-pay | Admitting: Infectious Diseases

## 2018-07-20 MED FILL — BIKTARVY 50-200-25 MG TABS: 50-200-25 | 30 days supply | Qty: 30 | Fill #1

## 2018-07-21 ENCOUNTER — Other Ambulatory Visit: Payer: Self-pay

## 2018-07-22 ENCOUNTER — Other Ambulatory Visit: Payer: Self-pay

## 2018-07-22 DIAGNOSIS — Z21 Asymptomatic human immunodeficiency virus [HIV] infection status: Secondary | ICD-10-CM

## 2018-07-24 LAB — HIV-1 RNA QUANT-NO REFLEX-BLD
HIV 1 RNA QUANT: NOT DETECTED {copies}/mL
HIV-1 RNA QUANT, LOG: NOT DETECTED {Log_copies}/mL

## 2018-07-29 ENCOUNTER — Ambulatory Visit (HOSPITAL_COMMUNITY): Payer: Self-pay

## 2018-08-04 ENCOUNTER — Encounter: Payer: Self-pay | Admitting: Infectious Diseases

## 2018-08-05 ENCOUNTER — Encounter: Payer: Self-pay | Admitting: Family Medicine

## 2018-08-05 ENCOUNTER — Ambulatory Visit: Payer: Self-pay | Admitting: Family Medicine

## 2018-08-06 ENCOUNTER — Other Ambulatory Visit (HOSPITAL_COMMUNITY): Payer: Self-pay | Admitting: *Deleted

## 2018-08-06 ENCOUNTER — Ambulatory Visit: Payer: Self-pay | Admitting: Infectious Diseases

## 2018-08-06 DIAGNOSIS — Z1231 Encounter for screening mammogram for malignant neoplasm of breast: Secondary | ICD-10-CM

## 2018-08-06 NOTE — Progress Notes (Signed)
Patient did not keep appointment today. She will be called to reschedule.  

## 2018-08-11 ENCOUNTER — Other Ambulatory Visit: Payer: Self-pay | Admitting: Infectious Diseases

## 2018-08-23 MED FILL — BIKTARVY 50-200-25 MG TABS: 50-200-25 | 30 days supply | Qty: 30 | Fill #0

## 2018-09-20 MED FILL — BIKTARVY 50-200-25 MG TABS: 50-200-25 | 30 days supply | Qty: 30 | Fill #1

## 2018-09-24 ENCOUNTER — Other Ambulatory Visit: Payer: Self-pay | Admitting: *Deleted

## 2018-09-24 DIAGNOSIS — B20 Human immunodeficiency virus [HIV] disease: Secondary | ICD-10-CM

## 2018-09-27 ENCOUNTER — Other Ambulatory Visit: Payer: Self-pay

## 2018-09-27 DIAGNOSIS — B20 Human immunodeficiency virus [HIV] disease: Secondary | ICD-10-CM

## 2018-09-28 LAB — T-HELPER CELL (CD4) - (RCID CLINIC ONLY)
CD4 T CELL ABS: 1000 /uL (ref 400–2700)
CD4 T CELL HELPER: 48 % (ref 33–55)

## 2018-09-29 LAB — CBC WITH DIFFERENTIAL/PLATELET
Basophils Absolute: 29 cells/uL (ref 0–200)
Basophils Relative: 0.6 %
EOS ABS: 53 {cells}/uL (ref 15–500)
Eosinophils Relative: 1.1 %
HCT: 36.3 % (ref 35.0–45.0)
HEMOGLOBIN: 11.7 g/dL (ref 11.7–15.5)
Lymphs Abs: 2021 cells/uL (ref 850–3900)
MCH: 29.5 pg (ref 27.0–33.0)
MCHC: 32.2 g/dL (ref 32.0–36.0)
MCV: 91.7 fL (ref 80.0–100.0)
MONOS PCT: 8.8 %
MPV: 12.2 fL (ref 7.5–12.5)
Neutro Abs: 2275 cells/uL (ref 1500–7800)
Neutrophils Relative %: 47.4 %
Platelets: 267 10*3/uL (ref 140–400)
RBC: 3.96 10*6/uL (ref 3.80–5.10)
RDW: 13 % (ref 11.0–15.0)
TOTAL LYMPHOCYTE: 42.1 %
WBC mixed population: 422 cells/uL (ref 200–950)
WBC: 4.8 10*3/uL (ref 3.8–10.8)

## 2018-09-29 LAB — COMPLETE METABOLIC PANEL WITH GFR
AG RATIO: 1.3 (calc) (ref 1.0–2.5)
ALT: 19 U/L (ref 6–29)
AST: 32 U/L (ref 10–35)
Albumin: 4.1 g/dL (ref 3.6–5.1)
Alkaline phosphatase (APISO): 117 U/L — ABNORMAL HIGH (ref 33–115)
BILIRUBIN TOTAL: 0.2 mg/dL (ref 0.2–1.2)
BUN: 9 mg/dL (ref 7–25)
CO2: 28 mmol/L (ref 20–32)
Calcium: 9.5 mg/dL (ref 8.6–10.2)
Chloride: 105 mmol/L (ref 98–110)
Creat: 1.02 mg/dL (ref 0.50–1.10)
GFR, EST NON AFRICAN AMERICAN: 65 mL/min/{1.73_m2} (ref >=60)
GFR, Est African American: 75 mL/min/{1.73_m2} (ref >=60)
GLOBULIN: 3.1 g/dL (ref 1.9–3.7)
Glucose, Bld: 98 mg/dL (ref 65–99)
Potassium: 4.4 mmol/L (ref 3.5–5.3)
SODIUM: 138 mmol/L (ref 135–146)
Total Protein: 7.2 g/dL (ref 6.1–8.1)

## 2018-09-29 LAB — HIV-1 RNA QUANT-NO REFLEX-BLD
HIV 1 RNA Quant: <20 copies/mL
HIV-1 RNA Quant, Log: <1.3 Log copies/mL

## 2018-09-29 LAB — RPR: RPR Ser Ql: NONREACTIVE

## 2018-10-14 ENCOUNTER — Encounter: Payer: Self-pay | Admitting: Infectious Diseases

## 2018-10-21 ENCOUNTER — Telehealth: Payer: Self-pay | Admitting: Infectious Diseases

## 2018-10-21 ENCOUNTER — Ambulatory Visit (HOSPITAL_COMMUNITY): Payer: Self-pay

## 2018-10-21 NOTE — Telephone Encounter (Signed)
Patient left a voicemail wanting a call concerning how her labs are.  She was not able to make it to her appointment.

## 2018-10-22 NOTE — Telephone Encounter (Signed)
RN relayed results to patient. She was glad, will contact pharmacy for refill of biktarvy.  She is coming to [place] clinic 1/7, wants to reschedule her BCCCP/mammogram appointments. She stated she couldn't talk at this time, as she is on the bus, but will call back for phone numbers once she is home. Landis Gandy, RN

## 2018-10-22 NOTE — Telephone Encounter (Signed)
Yes it is. She wanted to reschedule it for a later time due to expected dental procedure 1/7.  Will try to schedule her with you in 30 minute slot when she calls back to [facility] this afternoon.

## 2018-10-22 NOTE — Telephone Encounter (Addendum)
Is the [place] appointment for the pre-colposcopy evaluation? 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   Would at minimum repeat her pap smear at an upcoming office visit with me (64m) to follow closely for changes.   Thank you!

## 2018-10-22 NOTE — Telephone Encounter (Signed)
Thank you :)

## 2018-10-26 ENCOUNTER — Other Ambulatory Visit: Payer: Self-pay | Admitting: Pharmacist

## 2018-10-26 DIAGNOSIS — B20 Human immunodeficiency virus [HIV] disease: Secondary | ICD-10-CM

## 2018-10-26 MED ORDER — BICTEGRAVIR-EMTRICITAB-TENOFOV 50-200-25 MG PO TABS: 1.0000 | ORAL_TABLET | Freq: Every day | ORAL | 5 refills | Status: DC

## 2018-10-28 ENCOUNTER — Ambulatory Visit (HOSPITAL_COMMUNITY): Payer: Self-pay

## 2018-11-24 ENCOUNTER — Ambulatory Visit: Payer: Self-pay | Admitting: Infectious Diseases

## 2018-11-30 ENCOUNTER — Ambulatory Visit: Payer: Self-pay | Admitting: Infectious Diseases

## 2018-12-06 ENCOUNTER — Ambulatory Visit (INDEPENDENT_AMBULATORY_CARE_PROVIDER_SITE_OTHER): Payer: Self-pay | Admitting: Family

## 2018-12-06 ENCOUNTER — Encounter: Payer: Self-pay | Admitting: Family

## 2018-12-06 VITALS — BP 111/76 | HR 80 | Temp 98.0°F | Ht 59.0 in | Wt 117.0 lb

## 2018-12-06 DIAGNOSIS — Z0000 Encounter for general adult medical examination without abnormal findings: Secondary | ICD-10-CM

## 2018-12-06 DIAGNOSIS — Z23 Encounter for immunization: Secondary | ICD-10-CM

## 2018-12-06 DIAGNOSIS — B20 Human immunodeficiency virus [HIV] disease: Secondary | ICD-10-CM

## 2018-12-06 NOTE — Progress Notes (Signed)
Subjective:    Patient ID: Michelle Waters, female    DOB: 1970/12/25, 48 y.o.   MRN: [id number]  Chief Complaint  Patient presents with  . Follow-up    b20     HPI:  Michelle Waters is a 48 y.o. female who presents today for follow up of her HIV disease.  Michelle Waters was last seen in the office on 04/16/18 for routine follow up of HIV disease and Pap smear with good adherence and tolerance to her ART regimen of Biktarvy. Viral load was undetectable at that time. She was requested to follow up in 4 months with a couple of no show and cancelled appointments prior to today. Health maintenance due includes Prevnar.  Michelle Waters has been taking her Biktarvy as prescribed with no adverse side effects or missed doses. Denies fevers, chills, night sweats, headaches, changes in vision, neck pain/stiffness, nausea, diarrhea, vomiting, lesions or rashes.  She has no problems obtaining her medications and receives assistance through [facility] and has her medications mailed from [employer]. Housing remains stable. Denies sexual activity or recreational or illicit drug use. Continues to smoke about 1/3 of a pack of cigarettes per day. Has a dental appointment this morning for ongoing dental care.   Allergies  Allergen Reactions  . Ampicillin Swelling and Rash  . Penicillins Swelling and Rash  . Shrimp [Shellfish Allergy] Swelling  . Codeine Itching  . Hydrocodone Itching      Outpatient Medications Prior to Visit  Medication Sig Dispense Refill  . acetaminophen (TYLENOL) 500 MG tablet Take 1,000 mg every 6 (six) hours as needed by mouth for moderate pain.    Marland Kitchen albuterol (PROVENTIL HFA;VENTOLIN HFA) 108 (90 Base) MCG/ACT inhaler Inhale 2 puffs into the lungs every 6 (six) hours as needed for wheezing or shortness of breath. 54 g 3  . aspirin EC 81 MG tablet Take 1 tablet (81 mg total) by mouth daily. 30 tablet 0  . bictegravir-emtricitabine-tenofovir AF (BIKTARVY) 50-200-25 MG TABS  tablet Take 1 tablet by mouth daily. Try to take at the same time each day with or without food. 30 tablet 5  . diphenhydrAMINE (BENADRYL) 25 mg capsule Take 25 mg every 6 (six) hours as needed by mouth for allergies.    Marland Kitchen ENSURE (ENSURE) Take 237 mLs by mouth 2 (two) times daily between meals. 237 mL 6  . mirtazapine (REMERON) 15 MG tablet Take 1 tablet (15 mg total) by mouth at bedtime. 30 tablet 1  . omeprazole (PRILOSEC) 20 MG capsule TAKE 1 CAPSULE BY MOUTH DAILY BEFORE BREAKFAST. WAIT 30 MINUTES BEFORE EATING 30 capsule 0  . ondansetron (ZOFRAN ODT) 4 MG disintegrating tablet Take 1 tablet (4 mg total) by mouth every 8 (eight) hours as needed for nausea or vomiting. 20 tablet 0  . ranitidine (ZANTAC) 150 MG capsule Take 1 capsule (150 mg total) by mouth daily. 30 capsule 0  . tetrahydrozoline 0.05 % ophthalmic solution Place 1 drop daily as needed into both eyes (dry itchy eyes).     No facility-administered medications prior to visit.      Past Medical History:  Diagnosis Date  . Acid reflux   . Elevated serum creatinine 10/05/2017  . HIV (human immunodeficiency virus infection) ([place])   . Hypertension      Past Surgical History:  Procedure Laterality Date  . I&D EXTREMITY Right 08/30/2017   Procedure: IRRIGATION AND DEBRIDEMENT INDEX FINGER;  Surgeon: Netta Cedars, MD;  Location: [facility];  Service: Orthopedics;  Laterality: Right;       Review of Systems  Constitutional: Negative for appetite change, chills, diaphoresis, fatigue, fever and unexpected weight change.  Eyes:       Negative for acute change in vision  Respiratory: Negative for chest tightness, shortness of breath and wheezing.   Cardiovascular: Negative for chest pain.  Gastrointestinal: Negative for diarrhea, nausea and vomiting.  Genitourinary: Negative for dysuria, pelvic pain and vaginal discharge.  Musculoskeletal: Negative for neck pain and neck stiffness.  Skin: Negative for rash.  Neurological:  Negative for seizures, syncope, weakness and headaches.  Hematological: Negative for adenopathy. Does not bruise/bleed easily.  Psychiatric/Behavioral: Negative for hallucinations.      Objective:    BP 111/76   Pulse 80   Temp 98 F (36.7 C)   Ht 4\' 11"  (1.499 m)   Wt 117 lb (53.1 kg)   BMI 23.63 kg/m  Nursing note and vital signs reviewed.  Physical Exam Constitutional:      General: She is not in acute distress.    Appearance: She is well-developed.     Comments: Seated in the chair; pleasant; appears older than her stated age.   HENT:     Mouth/Throat:     Dentition: Abnormal dentition.  Eyes:     Conjunctiva/sclera: Conjunctivae normal.  Neck:     Musculoskeletal: Neck supple.  Cardiovascular:     Rate and Rhythm: Normal rate and regular rhythm.     Heart sounds: Normal heart sounds. No murmur. No friction rub. No gallop.   Pulmonary:     Effort: Pulmonary effort is normal. No respiratory distress.     Breath sounds: Normal breath sounds. No wheezing or rales.  Chest:     Chest wall: No tenderness.  Abdominal:     General: Bowel sounds are normal.     Palpations: Abdomen is soft.     Tenderness: There is no abdominal tenderness.  Lymphadenopathy:     Cervical: No cervical adenopathy.  Skin:    General: Skin is warm and dry.     Findings: No rash.  Neurological:     Mental Status: She is alert.  Psychiatric:        Mood and Affect: Mood normal.     Depression screen [facility] 2/9 04/16/2018 12/29/2017 10/05/2017 01/17/2015  Decreased Interest 0 0 0 0  Down, Depressed, Hopeless 0 0 2 0  PHQ - 2 Score 0 0 2 0  Altered sleeping - - 0 -  Tired, decreased energy - - 1 -  Change in appetite - - 1 -  Feeling bad or failure about yourself  - - 1 -  Trouble concentrating - - 2 -  Moving slowly or fidgety/restless - - 0 -  Suicidal thoughts - - 0 -  PHQ-9 Score - - 7 -  Difficult doing work/chores - - Somewhat difficult -       Assessment & Plan:   Problem List  Items Addressed This Visit      Other   HIV disease ([place]) - Primary    Michelle Waters has well controlled HIV disease with good adherence and tolerance to her ART regimen of Biktarvy. No signs/symptoms of opportunistic infection or progressive HIV disease. Will need to renew UMAP to ensure continued coverage. Continue current dose of Biktarvy. Plan for follow up office visit in 3 months or sooner if needed with lab work 1-2 weeks prior to appointment.       Relevant Orders  T-helper cell (CD4)- ([facility] clinic only)   HIV-1 RNA quant-no reflex-bld   CBC   Comprehensive metabolic panel   Lipid panel   RPR   Healthcare maintenance     Prevnar updated today.  Has continued dental care today.  Due for mammogram. May need to be referred for scholarship to obtain.   Discussed importance of safe sexual practice to reduce risk of acquisition or transmission of STI.           I am having Orah L. Falkner maintain her albuterol, omeprazole, acetaminophen, tetrahydrozoline, diphenhydrAMINE, aspirin EC, ondansetron, ENSURE, mirtazapine, ranitidine, and bictegravir-emtricitabine-tenofovir AF.   Follow-up: Return in about 3 months (around 03/06/2019), or if symptoms worsen or fail to improve.   Terri Piedra, MSN, FNP-C Nurse Practitioner [facility] [place] [facility] Office phone: [phone] Pager: [place] number: [phone]

## 2018-12-06 NOTE — Addendum Note (Signed)
Addended by: [name] on: 12/06/2018 01:33 PM   Modules accepted: Orders

## 2018-12-06 NOTE — Assessment & Plan Note (Signed)
   Prevnar updated today.  Has continued dental care today.  Due for mammogram. May need to be referred for scholarship to obtain.   Discussed importance of safe sexual practice to reduce risk of acquisition or transmission of STI.

## 2018-12-06 NOTE — Assessment & Plan Note (Signed)
Michelle Waters has well controlled HIV disease with good adherence and tolerance to her ART regimen of Biktarvy. No signs/symptoms of opportunistic infection or progressive HIV disease. Will need to renew UMAP to ensure continued coverage. Continue current dose of Biktarvy. Plan for follow up office visit in 3 months or sooner if needed with lab work 1-2 weeks prior to appointment.

## 2018-12-06 NOTE — Patient Instructions (Signed)
Nice to meet you!  Please continue to take your [place] as prescribed daily.   Plan for office follow up in 3 months or sooner if needed with lab work 1-2 weeks prior to appointment with either Janene Madeira or Terri Piedra.

## 2018-12-07 ENCOUNTER — Ambulatory Visit: Payer: Self-pay | Admitting: Family

## 2018-12-09 ENCOUNTER — Ambulatory Visit: Payer: Self-pay

## 2018-12-09 ENCOUNTER — Encounter: Payer: Self-pay | Admitting: Family

## 2018-12-10 NOTE — Telephone Encounter (Signed)
Spoke with patient. She would prefer to follow up with PAP at clinic with [facility]. RN scheduled her for 30 minute appointment 3/5.

## 2018-12-10 NOTE — Telephone Encounter (Signed)
Unfortunately I cannot offer her a colposcopy to follow up the abnormal pap smear. I can repeat one for her at minimum to evaluate any changes. Will discuss with her at upcoming appointment.

## 2018-12-22 ENCOUNTER — Ambulatory Visit: Payer: Self-pay | Admitting: Infectious Diseases

## 2019-02-14 ENCOUNTER — Other Ambulatory Visit: Payer: Self-pay

## 2019-02-14 MED ORDER — OMEPRAZOLE 20 MG PO CPDR: DELAYED_RELEASE_CAPSULE | ORAL | 2 refills | Status: DC

## 2019-02-21 ENCOUNTER — Telehealth: Payer: Self-pay

## 2019-02-21 NOTE — Telephone Encounter (Signed)
Patient called today requesting CM services due to needing housing. LPN called Lovena Le with [facility] and left voicemail to call [facility] to provided him with patient contact information.  [name], LPN

## 2019-03-07 ENCOUNTER — Other Ambulatory Visit: Payer: Self-pay

## 2019-03-07 DIAGNOSIS — B20 Human immunodeficiency virus [HIV] disease: Secondary | ICD-10-CM

## 2019-03-08 LAB — T-HELPER CELL (CD4) - (RCID CLINIC ONLY)
CD4 % Helper T Cell: 50 % (ref 33–65)
CD4 T Cell Abs: 828 /uL (ref 400–1790)

## 2019-03-08 LAB — URINE CYTOLOGY ANCILLARY ONLY
Chlamydia: NEGATIVE
Neisseria Gonorrhea: NEGATIVE

## 2019-03-12 LAB — CBC
HCT: 37 % (ref 35.0–45.0)
Hemoglobin: 12.4 g/dL (ref 11.7–15.5)
MCH: 29.8 pg (ref 27.0–33.0)
MCHC: 33.5 g/dL (ref 32.0–36.0)
MCV: 88.9 fL (ref 80.0–100.0)
MPV: 12.6 fL — ABNORMAL HIGH (ref 7.5–12.5)
Platelets: 266 10*3/uL (ref 140–400)
RBC: 4.16 10*6/uL (ref 3.80–5.10)
RDW: 13.6 % (ref 11.0–15.0)
WBC: 6.4 10*3/uL (ref 3.8–10.8)

## 2019-03-12 LAB — COMPREHENSIVE METABOLIC PANEL
AG Ratio: 1.4 (calc) (ref 1.0–2.5)
ALT: 17 U/L (ref 6–29)
AST: 29 U/L (ref 10–35)
Albumin: 4 g/dL (ref 3.6–5.1)
Alkaline phosphatase (APISO): 90 U/L (ref 31–125)
BUN: 12 mg/dL (ref 7–25)
CO2: 28 mmol/L (ref 20–32)
Calcium: 9.7 mg/dL (ref 8.6–10.2)
Chloride: 107 mmol/L (ref 98–110)
Creat: 0.85 mg/dL (ref 0.50–1.10)
Globulin: 2.9 g/dL (calc) (ref 1.9–3.7)
Glucose, Bld: 80 mg/dL (ref 65–99)
Potassium: 4.3 mmol/L (ref 3.5–5.3)
Sodium: 139 mmol/L (ref 135–146)
Total Bilirubin: 0.3 mg/dL (ref 0.2–1.2)
Total Protein: 6.9 g/dL (ref 6.1–8.1)

## 2019-03-12 LAB — SYPHILIS: RPR W/REFLEX TO RPR TITER AND TREPONEMAL ANTIBODIES, TRADITIONAL SCREENING AND DIAGNOSIS ALGORITHM: RPR Ser Ql: NONREACTIVE

## 2019-03-12 LAB — LIPID PANEL
Cholesterol: 222 mg/dL — ABNORMAL HIGH
HDL: 76 mg/dL
LDL Cholesterol (Calc): 118 mg/dL — ABNORMAL HIGH
Non-HDL Cholesterol (Calc): 146 mg/dL — ABNORMAL HIGH
Total CHOL/HDL Ratio: 2.9 (calc)
Triglycerides: 165 mg/dL — ABNORMAL HIGH

## 2019-03-12 LAB — HIV-1 RNA QUANT-NO REFLEX-BLD
HIV 1 RNA Quant: <20 copies/mL
HIV-1 RNA Quant, Log: <1.3 Log copies/mL

## 2019-03-24 ENCOUNTER — Telehealth: Payer: Self-pay | Admitting: Family

## 2019-03-24 NOTE — Telephone Encounter (Signed)
COVID-19 Pre-Screening Questions:  Do you currently have a fever (>100 F), chills or unexplained body aches?no    Are you currently experiencing new cough, shortness of breath, sore throat, runny nose?no  Have you recently travelled outside the state of [place] in the last 14 days? No    Have you been in contact with someone that is currently pending confirmation of Covid19 testing or has been confirmed to have the [place] virus?  No

## 2019-03-28 ENCOUNTER — Ambulatory Visit (INDEPENDENT_AMBULATORY_CARE_PROVIDER_SITE_OTHER): Payer: Self-pay | Admitting: Family

## 2019-03-28 ENCOUNTER — Other Ambulatory Visit: Payer: Self-pay

## 2019-03-28 ENCOUNTER — Encounter: Payer: Self-pay | Admitting: Family

## 2019-03-28 VITALS — BP 109/79 | HR 71 | Temp 98.2°F | Wt 117.0 lb

## 2019-03-28 DIAGNOSIS — Z0000 Encounter for general adult medical examination without abnormal findings: Secondary | ICD-10-CM

## 2019-03-28 DIAGNOSIS — B20 Human immunodeficiency virus [HIV] disease: Secondary | ICD-10-CM

## 2019-03-28 DIAGNOSIS — F17200 Nicotine dependence, unspecified, uncomplicated: Secondary | ICD-10-CM

## 2019-03-28 MED ORDER — BICTEGRAVIR-EMTRICITAB-TENOFOV 50-200-25 MG PO TABS: 1.0000 | ORAL_TABLET | Freq: Every day | ORAL | 5 refills | Status: DC

## 2019-03-28 NOTE — Assessment & Plan Note (Signed)
Continues to smoke approximately 1/3 pack of cigarettes per day.  Discussed importance of tobacco cessation to reduce risk of respiratory, cardiovascular, and malignant disease in the future.  She is in the precontemplation stage of quitting.  Continue to monitor.

## 2019-03-28 NOTE — Assessment & Plan Note (Signed)
Michelle Waters has well-controlled HIV disease with good adherence and tolerance to her ART regimen of Biktarvy.  No signs/symptoms of opportunistic infection or progressive HIV disease.  She will need to renew her financial assistance following July 1.  Continue current dose of Biktarvy.  Plan for follow-up in 4 months or sooner if needed with lab work 1 to 2 weeks prior to appointment.

## 2019-03-28 NOTE — Patient Instructions (Signed)
Nice to see you.  Please continue to take your [place] as prescribed daily.  Refills will be sent to the pharmacy.   We will work on getting you a colonoscopy when they re-start.  Plan for follow up in 4 months or sooner if needed with lab work 1-2 weeks prior to appointment.  Please make an appointment to renew your financial assistance after July 1st.   Have a great day and stay safe.

## 2019-03-28 NOTE — Progress Notes (Signed)
Subjective:    Patient ID: Michelle Waters, female    DOB: [birth year], 48 y.o.   MRN: [id number]  Chief Complaint  Patient presents with   HIV Positive/AIDS     HPI:  Michelle Waters is a 48 y.o. female with HIV disease who was last seen in the clinic on 12/06/2018 with good adherence and tolerance to her ART regimen of Biktarvy.  Most recent blood work available at the time showed a CD4 count of 1000 and viral load that was undetectable.  Most recent blood work completed on 03/07/2019 shows continued viral suppression with a viral load that is undetectable and CD4 count of 828.  RPR was found to be nonreactive and she was negative for gonorrhea and chlamydia.  Renal function, hepatic function, and electrolytes within normal ranges.  LDL elevated at 118, triglycerides of 165 and HDL of 76.  All immunizations are up-to-date per recommendations.  Due for colon cancer screening.   Michelle Waters has been taking her Biktarvy as prescribed with no adverse side effects or missed doses.  She is feeling well today with no complaints. Denies fevers, chills, night sweats, headaches, changes in vision, neck pain/stiffness, nausea, diarrhea, vomiting, lesions or rashes.  Michelle Waters continues to have coverage through [employer] and has no problems obtaining her medication from the pharmacy.  Denies feelings of being down, depressed, or hopeless recently.  No recreational or illicit drug use.  She does continue to smoke about one third of a pack of cigarettes per day.  Awaiting dental appointment which was postponed due to coronavirus pandemic.  Allergies  Allergen Reactions   Ampicillin Swelling and Rash   Penicillins Swelling and Rash   Shrimp [Shellfish Allergy] Swelling   Codeine Itching   Hydrocodone Itching      Outpatient Medications Prior to Visit  Medication Sig Dispense Refill   acetaminophen (TYLENOL) 500 MG tablet Take 1,000 mg every 6 (six) hours as needed by mouth for moderate pain.      albuterol (PROVENTIL HFA;VENTOLIN HFA) 108 (90 Base) MCG/ACT inhaler Inhale 2 puffs into the lungs every 6 (six) hours as needed for wheezing or shortness of breath. 54 g 3   aspirin EC 81 MG tablet Take 1 tablet (81 mg total) by mouth daily. 30 tablet 0   diphenhydrAMINE (BENADRYL) 25 mg capsule Take 25 mg every 6 (six) hours as needed by mouth for allergies.     ENSURE (ENSURE) Take 237 mLs by mouth 2 (two) times daily between meals. 237 mL 6   mirtazapine (REMERON) 15 MG tablet Take 1 tablet (15 mg total) by mouth at bedtime. 30 tablet 1   omeprazole (PRILOSEC) 20 MG capsule TAKE 1 CAPSULE BY MOUTH DAILY BEFORE BREAKFAST. WAIT 30 MINUTES BEFORE EATING 30 capsule 2   ondansetron (ZOFRAN ODT) 4 MG disintegrating tablet Take 1 tablet (4 mg total) by mouth every 8 (eight) hours as needed for nausea or vomiting. 20 tablet 0   ranitidine (ZANTAC) 150 MG capsule Take 1 capsule (150 mg total) by mouth daily. 30 capsule 0   tetrahydrozoline 0.05 % ophthalmic solution Place 1 drop daily as needed into both eyes (dry itchy eyes).     bictegravir-emtricitabine-tenofovir AF (BIKTARVY) 50-200-25 MG TABS tablet Take 1 tablet by mouth daily. Try to take at the same time each day with or without food. 30 tablet 5   No facility-administered medications prior to visit.      Past Medical History:  Diagnosis Date   Acid  reflux    Elevated serum creatinine 10/05/2017   HIV (human immunodeficiency virus infection) ([place])    Hypertension      Past Surgical History:  Procedure Laterality Date   I&D EXTREMITY Right 08/30/2017   Procedure: IRRIGATION AND DEBRIDEMENT INDEX FINGER;  Surgeon: Netta Cedars, MD;  Location: [facility];  Service: Orthopedics;  Laterality: Right;       Review of Systems  Constitutional: Negative for appetite change, chills, diaphoresis, fatigue, fever and unexpected weight change.  Eyes:       Negative for acute change in vision  Respiratory: Negative for chest  tightness, shortness of breath and wheezing.   Cardiovascular: Negative for chest pain.  Gastrointestinal: Negative for diarrhea, nausea and vomiting.  Genitourinary: Negative for dysuria, pelvic pain and vaginal discharge.  Musculoskeletal: Negative for neck pain and neck stiffness.  Skin: Negative for rash.  Neurological: Negative for seizures, syncope, weakness and headaches.  Hematological: Negative for adenopathy. Does not bruise/bleed easily.  Psychiatric/Behavioral: Negative for hallucinations.      Objective:    BP 109/79    Pulse 71    Temp 98.2 F (36.8 C)    Wt 117 lb (53.1 kg)    BMI 23.63 kg/m  Nursing note and vital signs reviewed.  Physical Exam Constitutional:      General: She is not in acute distress.    Appearance: She is well-developed.  Eyes:     Conjunctiva/sclera: Conjunctivae normal.  Neck:     Musculoskeletal: Neck supple.  Cardiovascular:     Rate and Rhythm: Normal rate and regular rhythm.     Heart sounds: Normal heart sounds. No murmur. No friction rub. No gallop.   Pulmonary:     Effort: Pulmonary effort is normal. No respiratory distress.     Breath sounds: Normal breath sounds. No wheezing or rales.  Chest:     Chest wall: No tenderness.  Abdominal:     General: Bowel sounds are normal.     Palpations: Abdomen is soft.     Tenderness: There is no abdominal tenderness.  Lymphadenopathy:     Cervical: No cervical adenopathy.  Skin:    General: Skin is warm and dry.     Findings: No rash.  Neurological:     Mental Status: She is alert and oriented to person, place, and time.  Psychiatric:        Behavior: Behavior normal.        Thought Content: Thought content normal.        Judgment: Judgment normal.        Assessment & Plan:   Problem List Items Addressed This Visit      Other   Smoker    Continues to smoke approximately 1/3 pack of cigarettes per day.  Discussed importance of tobacco cessation to reduce risk of respiratory,  cardiovascular, and malignant disease in the future.  She is in the precontemplation stage of quitting.  Continue to monitor.      HIV disease ([place]) - Primary    Michelle Waters has well-controlled HIV disease with good adherence and tolerance to her ART regimen of Biktarvy.  No signs/symptoms of opportunistic infection or progressive HIV disease.  She will need to renew her financial assistance following July 1.  Continue current dose of Biktarvy.  Plan for follow-up in 4 months or sooner if needed with lab work 1 to 2 weeks prior to appointment.      Relevant Medications   bictegravir-emtricitabine-tenofovir AF (BIKTARVY) 50-200-25 MG  TABS tablet   Other Relevant Orders   T-helper cell (CD4)- ([facility] clinic only)   HIV-1 RNA quant-no reflex-bld   Comprehensive metabolic panel   CBC   Healthcare maintenance     All immunizations are up-to-date per recommendations  Due for colonoscopy when colon cancer screenings are resumed  Discussed importance of safe sexual practice to reduce risk of acquisition/transmission of STI.  Declines condoms.          I am having Michelle Waters maintain her albuterol, acetaminophen, tetrahydrozoline, diphenhydrAMINE, aspirin EC, ondansetron, Ensure, mirtazapine, ranitidine, omeprazole, and bictegravir-emtricitabine-tenofovir AF.   Meds ordered this encounter  Medications   bictegravir-emtricitabine-tenofovir AF (BIKTARVY) 50-200-25 MG TABS tablet    Sig: Take 1 tablet by mouth daily. Try to take at the same time each day with or without food.    Dispense:  30 tablet    Refill:  5    Order Specific Question:   Supervising Provider    Answer:   Carlyle Basques [[id number]]     Follow-up: Return in about 4 months (around 07/28/2019), or if symptoms worsen or fail to improve.   Terri Piedra, MSN, FNP-C Nurse Practitioner [facility] for Infectious Disease [place] number: [phone]

## 2019-03-28 NOTE — Assessment & Plan Note (Signed)
   All immunizations are up-to-date per recommendations  Due for colonoscopy when colon cancer screenings are resumed  Discussed importance of safe sexual practice to reduce risk of acquisition/transmission of STI.  Declines condoms.

## 2019-04-25 ENCOUNTER — Ambulatory Visit: Payer: Self-pay

## 2019-04-26 ENCOUNTER — Other Ambulatory Visit: Payer: Self-pay

## 2019-04-26 ENCOUNTER — Ambulatory Visit: Payer: Self-pay

## 2019-05-19 ENCOUNTER — Encounter: Payer: Self-pay | Admitting: Family

## 2019-05-31 ENCOUNTER — Other Ambulatory Visit: Payer: Self-pay | Admitting: Infectious Diseases

## 2019-05-31 DIAGNOSIS — K219 Gastro-esophageal reflux disease without esophagitis: Secondary | ICD-10-CM

## 2019-06-30 ENCOUNTER — Ambulatory Visit: Payer: Self-pay

## 2019-07-28 ENCOUNTER — Other Ambulatory Visit: Payer: Self-pay

## 2019-07-28 ENCOUNTER — Ambulatory Visit (INDEPENDENT_AMBULATORY_CARE_PROVIDER_SITE_OTHER): Payer: Self-pay

## 2019-07-28 DIAGNOSIS — Z23 Encounter for immunization: Secondary | ICD-10-CM

## 2019-07-28 DIAGNOSIS — B20 Human immunodeficiency virus [HIV] disease: Secondary | ICD-10-CM

## 2019-07-29 LAB — T-HELPER CELL (CD4) - (RCID CLINIC ONLY)
CD4 % Helper T Cell: 52 % (ref 33–65)
CD4 T Cell Abs: 904 /uL (ref 400–1790)

## 2019-08-03 LAB — CBC
HCT: 43.1 % (ref 35.0–45.0)
Hemoglobin: 14.1 g/dL (ref 11.7–15.5)
MCH: 30.2 pg (ref 27.0–33.0)
MCHC: 32.7 g/dL (ref 32.0–36.0)
MCV: 92.3 fL (ref 80.0–100.0)
MPV: 12.3 fL (ref 7.5–12.5)
Platelets: 296 10*3/uL (ref 140–400)
RBC: 4.67 10*6/uL (ref 3.80–5.10)
RDW: 12.6 % (ref 11.0–15.0)
WBC: 4.9 10*3/uL (ref 3.8–10.8)

## 2019-08-03 LAB — COMPREHENSIVE METABOLIC PANEL
AG Ratio: 1.4 (calc) (ref 1.0–2.5)
ALT: 15 U/L (ref 6–29)
AST: 33 U/L (ref 10–35)
Albumin: 4.6 g/dL (ref 3.6–5.1)
Alkaline phosphatase (APISO): 91 U/L (ref 37–153)
BUN: 15 mg/dL (ref 7–25)
CO2: 30 mmol/L (ref 20–32)
Calcium: 10.6 mg/dL — ABNORMAL HIGH (ref 8.6–10.4)
Chloride: 100 mmol/L (ref 98–110)
Creat: 0.98 mg/dL (ref 0.50–1.05)
Globulin: 3.4 g/dL (calc) (ref 1.9–3.7)
Glucose, Bld: 107 mg/dL — ABNORMAL HIGH (ref 65–99)
Potassium: 4.7 mmol/L (ref 3.5–5.3)
Sodium: 136 mmol/L (ref 135–146)
Total Bilirubin: 0.5 mg/dL (ref 0.2–1.2)
Total Protein: 8 g/dL (ref 6.1–8.1)

## 2019-08-03 LAB — HIV-1 RNA QUANT-NO REFLEX-BLD
HIV 1 RNA Quant: <20 copies/mL
HIV-1 RNA Quant, Log: <1.3 Log copies/mL

## 2019-08-15 ENCOUNTER — Encounter: Payer: Self-pay | Admitting: Family

## 2019-09-12 ENCOUNTER — Other Ambulatory Visit: Payer: Self-pay | Admitting: Family

## 2019-09-12 DIAGNOSIS — B20 Human immunodeficiency virus [HIV] disease: Secondary | ICD-10-CM

## 2019-10-05 ENCOUNTER — Telehealth: Payer: Self-pay | Admitting: *Deleted

## 2019-10-05 ENCOUNTER — Other Ambulatory Visit: Payer: Self-pay | Admitting: Family

## 2019-10-05 DIAGNOSIS — K219 Gastro-esophageal reflux disease without esophagitis: Secondary | ICD-10-CM

## 2019-10-05 NOTE — Telephone Encounter (Signed)
Noted and agree with plan. We can change her medication at her appointment if needed.

## 2019-10-05 NOTE — Telephone Encounter (Signed)
1 Patient asked for refills of omeprazole, stating she takes it twice a day because of nausea with biktarvy.  She will discuss this with Marya Amsler at her appointment  12/22. 2 RN gave her phone numbers to establish primary care, as she is having issues with arthritis, sinus issues, vision issue, and disability. Her husband is currently out of work due to covid.  Will connect to [place] for further assistance as patient is worried about bills.  Landis Gandy, RN

## 2019-10-11 ENCOUNTER — Encounter: Payer: Self-pay | Admitting: Family

## 2019-10-11 ENCOUNTER — Ambulatory Visit (INDEPENDENT_AMBULATORY_CARE_PROVIDER_SITE_OTHER): Payer: Self-pay | Admitting: Family

## 2019-10-11 ENCOUNTER — Other Ambulatory Visit: Payer: Self-pay

## 2019-10-11 DIAGNOSIS — Z113 Encounter for screening for infections with a predominantly sexual mode of transmission: Secondary | ICD-10-CM

## 2019-10-11 DIAGNOSIS — F17200 Nicotine dependence, unspecified, uncomplicated: Secondary | ICD-10-CM

## 2019-10-11 DIAGNOSIS — Z0000 Encounter for general adult medical examination without abnormal findings: Secondary | ICD-10-CM

## 2019-10-11 DIAGNOSIS — Z79899 Other long term (current) drug therapy: Secondary | ICD-10-CM

## 2019-10-11 DIAGNOSIS — B20 Human immunodeficiency virus [HIV] disease: Secondary | ICD-10-CM

## 2019-10-11 MED ORDER — BIKTARVY 50-200-25 MG PO TABS: ORAL_TABLET | ORAL | 5 refills | Status: DC

## 2019-10-11 NOTE — Assessment & Plan Note (Signed)
Michelle Waters continues to have well-controlled HIV disease with good adherence and tolerance to her ART regimen of Biktarvy.  Does not sound to have any symptoms of progressive HIV disease or opportunistic infection.  Reviewed lab work and discussed plan of care with time for questions.  Continue current dose of Biktarvy.  Will need to renew financial assistance after January 4.  Plan for follow-up in 4 months or sooner if needed with lab work 1 to 2 weeks prior to appointment.  We will have schedulers reach out to schedule her next appointment.

## 2019-10-11 NOTE — Patient Instructions (Signed)
Nice to speak with you.  Continue to take your Vance as prescribed daily.  Refills have been sent to the pharmacy.  Plan for follow-up in 4 months or sooner if needed with lab work 1 to 2 weeks prior to your appointment.  Have a great holiday and stay safe!

## 2019-10-11 NOTE — Assessment & Plan Note (Signed)
   All immunizations are up-to-date per recommendations.  Due for colon cancer screening with colonoscopy and mammogram for breast cancer screening.  Tetanus vaccination at next office visit.  Discussed importance of safe sexual practice to reduce risk of STI.

## 2019-10-11 NOTE — Assessment & Plan Note (Signed)
Michelle Waters continues to smoke approximately 1/2 pack of cigarettes per day and is in the precontemplation stage of quitting with no desire to quit at the present time.  Discussed importance of tobacco cessation to reduce risk of cardiovascular, respiratory, malignant disease in the future.  Continue to monitor.

## 2019-10-11 NOTE — Progress Notes (Signed)
Subjective:    Patient ID: Michelle Waters, female    DOB: 1970-10-24, 48 y.o.   MRN: [id number]  Chief Complaint  Patient presents with  . HIV Positive/AIDS     Virtual Visit via Telephone Note   I connected with [employer] on 10/11/2019 at  11:40 am by telephone and verified that I am speaking with the correct person using two identifiers.    I discussed the limitations, risks, security and privacy concerns of performing an evaluation and management service by telephone and the availability of in person appointments. I also discussed with the patient that there may be a patient responsible charge related to this service. The patient expressed understanding and agreed to proceed.   HPI:  Michelle Waters is a 48 y.o. female with HIV disease who was last seen in the office on 03/28/2019 with good adherence and tolerance to her ART regimen of Biktarvy.  Viral load at the time was found to be undetectable with CD4 count of 828.  Most recent blood work completed on 07/28/2019 shows continued viral suppression being undetectable and CD4 count of 904.  Kidney function, liver function, electrolytes within normal ranges.  All immunizations up-to-date per recommendations.  Michelle Waters has continued to take her Biktarvy as prescribed no adverse side effects or missed doses since her last office visit.  Overall feeling well today with no new concerns/complaints. Denies fevers, chills, night sweats, headaches, changes in vision, neck pain/stiffness, nausea, diarrhea, vomiting, lesions or rashes.   Michelle Waters has no problems obtaining her medication from the pharmacy and remains covered through [employer].  She denies feelings of being down, depressed, or hopeless.  No recreational or illicit drug use or alcohol consumption.  She does continue to smoke approximately 1/2 pack of cigarettes per day on average.  Not currently sexually active.  Has been remaining at home to prevent transmission of  coronavirus.  Allergies  Allergen Reactions  . Ampicillin Swelling and Rash  . Penicillins Swelling and Rash  . Shrimp [Shellfish Allergy] Swelling  . Codeine Itching  . Hydrocodone Itching    Outpatient Medications Prior to Visit  Medication Sig Dispense Refill  . acetaminophen (TYLENOL) 500 MG tablet Take 1,000 mg every 6 (six) hours as needed by mouth for moderate pain.    Marland Kitchen albuterol (PROVENTIL HFA;VENTOLIN HFA) 108 (90 Base) MCG/ACT inhaler Inhale 2 puffs into the lungs every 6 (six) hours as needed for wheezing or shortness of breath. 54 g 3  . aspirin EC 81 MG tablet Take 1 tablet (81 mg total) by mouth daily. 30 tablet 0  . diphenhydrAMINE (BENADRYL) 25 mg capsule Take 25 mg every 6 (six) hours as needed by mouth for allergies.    Marland Kitchen ENSURE (ENSURE) Take 237 mLs by mouth 2 (two) times daily between meals. 237 mL 6  . mirtazapine (REMERON) 15 MG tablet Take 1 tablet (15 mg total) by mouth at bedtime. 30 tablet 1  . omeprazole (PRILOSEC) 20 MG capsule TAKE ONE CAPSULE BY MOUTH EVERY DAY BEFORE BREAKFAST(WAIT 30 MINUTES BEFORE EATING) 30 capsule 4  . ondansetron (ZOFRAN ODT) 4 MG disintegrating tablet Take 1 tablet (4 mg total) by mouth every 8 (eight) hours as needed for nausea or vomiting. 20 tablet 0  . ranitidine (ZANTAC) 150 MG capsule Take 1 capsule (150 mg total) by mouth daily. 30 capsule 0  . tetrahydrozoline 0.05 % ophthalmic solution Place 1 drop daily as needed into both eyes (dry itchy eyes).    Marland Kitchen  BIKTARVY 50-200-25 MG TABS tablet TAKE 1 TABLET BY MOUTH DAILY AT SAME TIME EACH DAY WITH OR WITHOUT FOOD 30 tablet 5   No facility-administered medications prior to visit.     Past Medical History:  Diagnosis Date  . Acid reflux   . Elevated serum creatinine 10/05/2017  . HIV (human immunodeficiency virus infection) ([place])   . Hypertension      Past Surgical History:  Procedure Laterality Date  . I & D EXTREMITY Right 08/30/2017   Procedure: IRRIGATION AND  DEBRIDEMENT INDEX FINGER;  Surgeon: Netta Cedars, MD;  Location: [facility];  Service: Orthopedics;  Laterality: Right;       Review of Systems  Constitutional: Negative for appetite change, chills, diaphoresis, fatigue, fever and unexpected weight change.  Eyes:       Negative for acute change in vision  Respiratory: Negative for chest tightness, shortness of breath and wheezing.   Cardiovascular: Negative for chest pain.  Gastrointestinal: Negative for diarrhea, nausea and vomiting.  Genitourinary: Negative for dysuria, pelvic pain and vaginal discharge.  Musculoskeletal: Negative for neck pain and neck stiffness.  Skin: Negative for rash.  Neurological: Negative for seizures, syncope, weakness and headaches.  Hematological: Negative for adenopathy. Does not bruise/bleed easily.  Psychiatric/Behavioral: Negative for hallucinations.      Objective:    Nursing note and vital signs reviewed.    Michelle Waters is pleasant to speak with and sounds to be doing well.  Physical exam limited due to telephone visit. Assessment & Plan:   Problem List Items Addressed This Visit      Other   Smoker    Michelle Waters continues to smoke approximately 1/2 pack of cigarettes per day and is in the precontemplation stage of quitting with no desire to quit at the present time.  Discussed importance of tobacco cessation to reduce risk of cardiovascular, respiratory, malignant disease in the future.  Continue to monitor.      HIV disease ([place])    Michelle Waters continues to have well-controlled HIV disease with good adherence and tolerance to her ART regimen of Biktarvy.  Does not sound to have any symptoms of progressive HIV disease or opportunistic infection.  Reviewed lab work and discussed plan of care with time for questions.  Continue current dose of Biktarvy.  Will need to renew financial assistance after January 4.  Plan for follow-up in 4 months or sooner if needed with lab work 1 to 2 weeks prior to  appointment.  We will have schedulers reach out to schedule her next appointment.      Relevant Medications   bictegravir-emtricitabine-tenofovir AF (BIKTARVY) 50-200-25 MG TABS tablet   Other Relevant Orders   3 month VL   3 month CBC   3 month T-helper cell (CD4)- ([facility] clinic only)   3 month CMP   Healthcare maintenance     All immunizations are up-to-date per recommendations.  Due for colon cancer screening with colonoscopy and mammogram for breast cancer screening.  Tetanus vaccination at next office visit.  Discussed importance of safe sexual practice to reduce risk of STI.       Other Visit Diagnoses    Screening for STDs (sexually transmitted diseases)    -  Primary   Relevant Orders   3 month RPR   Urine cytology ancillary only([place])   Pharmacologic therapy       Relevant Orders   3 month lipid       I have changed Keyara L. Blades's  Biktarvy. I am also having her maintain her albuterol, acetaminophen, tetrahydrozoline, diphenhydrAMINE, aspirin EC, ondansetron, Ensure, mirtazapine, ranitidine, and omeprazole.   Meds ordered this encounter  Medications  . bictegravir-emtricitabine-tenofovir AF (BIKTARVY) 50-200-25 MG TABS tablet    Sig: TAKE 1 TABLET BY MOUTH DAILY AT SAME TIME EACH DAY WITH OR WITHOUT FOOD    Dispense:  30 tablet    Refill:  5    Order Specific Question:   Supervising Provider    Answer:   Carlyle Basques [phone]     I discussed the assessment and treatment plan with the patient. The patient was provided an opportunity to ask questions and all were answered. The patient agreed with the plan and demonstrated an understanding of the instructions.   The patient was advised to call back or seek an in-person evaluation if the symptoms worsen or if the condition fails to improve as anticipated.   I provided 11  minutes of non-face-to-face time during this encounter.  Follow-up: Return in about 4 months (around 02/09/2020), or if symptoms  worsen or fail to improve.   Terri Piedra, MSN, FNP-C Nurse Practitioner [facility] for Infectious Disease [place] number: [phone]

## 2019-10-24 ENCOUNTER — Telehealth: Payer: Self-pay

## 2019-10-24 ENCOUNTER — Ambulatory Visit: Payer: Self-pay

## 2019-10-24 NOTE — Telephone Encounter (Signed)
Patient called office today to schedule appointment for financial assistance renewal. Patient will see financial counselor before dental appointment on 1/5. Patient would like to speak with Terri Piedra, FNP when she comes in for her appointment. Informed patient last note states she is not due to see FNP until April 2021. Unable to schedule appointment over the phone. [place]

## 2019-10-31 ENCOUNTER — Other Ambulatory Visit: Payer: Self-pay

## 2019-10-31 ENCOUNTER — Ambulatory Visit: Payer: Self-pay

## 2019-11-01 ENCOUNTER — Encounter: Payer: Self-pay | Admitting: Family

## 2019-11-08 ENCOUNTER — Other Ambulatory Visit: Payer: Self-pay

## 2019-11-08 ENCOUNTER — Telehealth: Payer: Self-pay

## 2019-11-08 ENCOUNTER — Other Ambulatory Visit: Payer: Self-pay | Admitting: Family

## 2019-11-08 MED ORDER — ENSURE PO LIQD: 237.0000 mL | Freq: Two times a day (BID) | ORAL | 5 refills | Status: DC

## 2019-11-08 NOTE — Telephone Encounter (Signed)
I am okay with refill of Ensure - needs to take it at least a couple of hours away from [place]. Thanks!

## 2019-11-08 NOTE — Telephone Encounter (Signed)
E44.1, mild protein-calorie malnutrition.   Thanks.

## 2019-11-08 NOTE — Telephone Encounter (Signed)
Printing prescription to send to [facility], please add appropriate reimbursable diagnosis to diagnosis list. Thanks!  Breann Losano Lorita Officer, RN

## 2019-11-08 NOTE — Telephone Encounter (Signed)
Received call from [place] at [facility] who stated that while meeting with Raihana she requested Ensures. Caryl Pina called to see about refills however the last time they were ordered was in 2019. Let Caryl Pina know that the patient's provider would be notified and if they felt it appropriate, a refill would be provided.   Abbigal Radich Lorita Officer, RN

## 2019-11-09 DIAGNOSIS — E441 Mild protein-calorie malnutrition: Secondary | ICD-10-CM | POA: Insufficient documentation

## 2019-11-11 ENCOUNTER — Ambulatory Visit: Payer: Self-pay | Attending: Internal Medicine | Admitting: Internal Medicine

## 2019-11-11 ENCOUNTER — Encounter: Payer: Self-pay | Admitting: Internal Medicine

## 2019-11-11 ENCOUNTER — Other Ambulatory Visit: Payer: Self-pay

## 2019-11-11 DIAGNOSIS — F32A Depression, unspecified: Secondary | ICD-10-CM | POA: Insufficient documentation

## 2019-11-11 DIAGNOSIS — F17210 Nicotine dependence, cigarettes, uncomplicated: Secondary | ICD-10-CM

## 2019-11-11 DIAGNOSIS — Z21 Asymptomatic human immunodeficiency virus [HIV] infection status: Secondary | ICD-10-CM

## 2019-11-11 DIAGNOSIS — B20 Human immunodeficiency virus [HIV] disease: Secondary | ICD-10-CM

## 2019-11-11 DIAGNOSIS — F320 Major depressive disorder, single episode, mild: Secondary | ICD-10-CM

## 2019-11-11 DIAGNOSIS — F17200 Nicotine dependence, unspecified, uncomplicated: Secondary | ICD-10-CM

## 2019-11-11 MED ORDER — SERTRALINE HCL 50 MG PO TABS: ORAL_TABLET | ORAL | 1 refills | Status: DC

## 2019-11-11 NOTE — Progress Notes (Signed)
Virtual Visit via Telephone Note Due to current restrictions/limitations of in-office visits due to the COVID-19 pandemic, this scheduled clinical appointment was converted to a telehealth visit  I connected with [place] on 11/11/19 at 2:13 p.m by telephone and verified that I am speaking with the correct person using two identifiers. I am in my office.  The patient is at home.  Only the patient and myself participated in this encounter.  I discussed the limitations, risks, security and privacy concerns of performing an evaluation and management service by telephone and the availability of in person appointments. I also discussed with the patient that there may be a patient responsible charge related to this service. The patient expressed understanding and agreed to proceed.   History of Present Illness: Pt with hx of HIV, tob dep  No previous PCP  HIV: followed by ID.  Compliant with her medication.  Last viral load undetected.  Tob dep:  Smoke 1/2 pk a day.  Smoked for 25 yrs.  Quit for 1 mth last yr. she feels she is somewhat ready to quit.  She is not use the patches, or Chantix or Wellbutrin in the past.    Depression: She reports feeling depressed.  Several things contribute to this including not able to see well, not working and just dealing with a lot.  Feels she would benefit from being on med.  Not on meds in past.  Denies SI.  Would like to see a counselor   Current Outpatient Medications  Medication Instructions  . acetaminophen (TYLENOL) 1,000 mg, Oral, Every 6 hours PRN  . albuterol (PROVENTIL HFA;VENTOLIN HFA) 108 (90 Base) MCG/ACT inhaler 2 puffs, Inhalation, Every 6 hours PRN  . aspirin EC 81 mg, Oral, Daily  . bictegravir-emtricitabine-tenofovir AF (BIKTARVY) 50-200-25 MG TABS tablet TAKE 1 TABLET BY MOUTH DAILY AT SAME TIME EACH DAY WITH OR WITHOUT FOOD  . diphenhydrAMINE (BENADRYL) 25 mg, Oral, Every 6 hours PRN  . Ensure (ENSURE) 237 mLs, Oral, 2 times daily  between meals, Drink Ensure at least 4 hours before or after taking your Biktarvy.  . mirtazapine (REMERON) 15 mg, Oral, Daily at bedtime  . omeprazole (PRILOSEC) 20 MG capsule TAKE ONE CAPSULE BY MOUTH EVERY DAY BEFORE BREAKFAST(WAIT 30 MINUTES BEFORE EATING)  . ondansetron (ZOFRAN ODT) 4 mg, Oral, Every 8 hours PRN  . ranitidine (ZANTAC) 150 mg, Oral, Daily  . tetrahydrozoline 0.05 % ophthalmic solution 1 drop, Both Eyes, Daily PRN    Observations/Objective: Lab Results  Component Value Date   WBC 4.9 07/28/2019   HGB 14.1 07/28/2019   HCT 43.1 07/28/2019   MCV 92.3 07/28/2019   PLT 296 07/28/2019     Chemistry      Component Value Date/Time   NA 136 07/28/2019 0857   K 4.7 07/28/2019 0857   CL 100 07/28/2019 0857   CO2 30 07/28/2019 0857   BUN 15 07/28/2019 0857   CREATININE 0.98 07/28/2019 0857      Component Value Date/Time   CALCIUM 10.6 (H) 07/28/2019 0857   ALKPHOS 98 03/16/2018 0852   AST 33 07/28/2019 0857   ALT 15 07/28/2019 0857   BILITOT 0.5 07/28/2019 0857      Assessment and Plan:  1. 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  We discussed putting her on low-dose of Zoloft.  She is not suicidal.  She feels she would also benefit from counseling.  I will have our LCSW follow-up with her - sertraline (ZOLOFT) 50 MG tablet; 0.5 tab PO daily x 2 wks then 1 tab daily  Dispense: 30 tablet; Refill: 1  3. Tobacco dependence Advised to quit.  Discussed health risks associated with smoking.  Discussed methods to help her quit.  She would like to try Chantix.  However we will wait to see how she does on Zoloft and then initiate Chantix once depression is under control.  Less than 5 minutes spent on counseling  Follow Up Instructions: 6 wks   I discussed the assessment and treatment plan with the patient. The patient was provided an opportunity to ask questions and all were answered. The  patient agreed with the plan and demonstrated an understanding of the instructions.   The patient was advised to call back or seek an in-person evaluation if the symptoms worsen or if the condition fails to improve as anticipated.  I provided 14 minutes of non-face-to-face time during this encounter.   Karle Plumber, MD

## 2019-12-05 ENCOUNTER — Telehealth: Payer: Self-pay

## 2019-12-05 NOTE — Telephone Encounter (Signed)
Patient called office today in regards to some bills she received from office. States one was from her PCP's office and one from [facility]. Requested patient bring [facility] bill to office, and that she contact her PCP in regards to their bill. Advised patient to check  with PCP in regards to financial assistance.  Scheduled patient an appointment for lab work and office visit two weeks after. [place]

## 2019-12-26 ENCOUNTER — Encounter: Payer: Self-pay | Admitting: Licensed Clinical Social Worker

## 2019-12-26 ENCOUNTER — Ambulatory Visit: Payer: Self-pay | Admitting: Internal Medicine

## 2020-01-20 ENCOUNTER — Ambulatory Visit: Payer: Self-pay | Attending: Internal Medicine

## 2020-01-20 DIAGNOSIS — Z23 Encounter for immunization: Secondary | ICD-10-CM

## 2020-01-20 NOTE — Progress Notes (Signed)
   Covid-19 Vaccination Clinic  Name:  FAIR BRAHMBHATT    MRN: [id number] DOB: 1971-06-05  01/20/2020  Ms. Culliver was observed post Covid-19 immunization for 15 minutes without incident. She was provided with Vaccine Information Sheet and instruction to access the V-Safe system.   Ms. Valensuela was instructed to call 911 with any severe reactions post vaccine: Marland Kitchen Difficulty breathing  . Swelling of face and throat  . A fast heartbeat  . A bad rash all over body  . Dizziness and weakness   Immunizations Administered    Name Date Dose VIS Date Route   Pfizer COVID-19 Vaccine 01/20/2020  1:35 PM 0.3 mL 09/30/2019 Intramuscular   Manufacturer: [employer]   Lot: [id number]   [place]: [id number]

## 2020-01-26 ENCOUNTER — Other Ambulatory Visit: Payer: Self-pay

## 2020-01-26 DIAGNOSIS — B20 Human immunodeficiency virus [HIV] disease: Secondary | ICD-10-CM

## 2020-01-26 DIAGNOSIS — Z113 Encounter for screening for infections with a predominantly sexual mode of transmission: Secondary | ICD-10-CM

## 2020-01-26 DIAGNOSIS — Z79899 Other long term (current) drug therapy: Secondary | ICD-10-CM

## 2020-01-27 ENCOUNTER — Other Ambulatory Visit: Payer: Self-pay | Admitting: Family

## 2020-01-27 ENCOUNTER — Other Ambulatory Visit: Payer: Self-pay | Admitting: Internal Medicine

## 2020-01-27 DIAGNOSIS — F320 Major depressive disorder, single episode, mild: Secondary | ICD-10-CM

## 2020-01-27 DIAGNOSIS — F32A Depression, unspecified: Secondary | ICD-10-CM

## 2020-01-27 DIAGNOSIS — A539 Syphilis, unspecified: Secondary | ICD-10-CM

## 2020-01-27 LAB — T-HELPER CELL (CD4) - (RCID CLINIC ONLY)
CD4 % Helper T Cell: 49 % (ref 33–65)
CD4 T Cell Abs: 824 /uL (ref 400–1790)

## 2020-01-27 LAB — URINE CYTOLOGY ANCILLARY ONLY
Chlamydia: NEGATIVE
Comment: NEGATIVE
Comment: NORMAL
Neisseria Gonorrhea: NEGATIVE

## 2020-01-27 MED ORDER — DOXYCYCLINE HYCLATE 100 MG PO TABS: 100.0000 mg | ORAL_TABLET | Freq: Two times a day (BID) | ORAL | 0 refills | Status: DC

## 2020-01-28 LAB — COMPREHENSIVE METABOLIC PANEL
AG Ratio: 1.2 (calc) (ref 1.0–2.5)
ALT: 16 U/L (ref 6–29)
AST: 27 U/L (ref 10–35)
Albumin: 4.4 g/dL (ref 3.6–5.1)
Alkaline phosphatase (APISO): 120 U/L (ref 37–153)
BUN: 16 mg/dL (ref 7–25)
CO2: 30 mmol/L (ref 20–32)
Calcium: 10.1 mg/dL (ref 8.6–10.4)
Chloride: 99 mmol/L (ref 98–110)
Creat: 0.98 mg/dL (ref 0.50–1.05)
Globulin: 3.7 g/dL (calc) (ref 1.9–3.7)
Glucose, Bld: 100 mg/dL — ABNORMAL HIGH (ref 65–99)
Potassium: 4.1 mmol/L (ref 3.5–5.3)
Sodium: 135 mmol/L (ref 135–146)
Total Bilirubin: 0.5 mg/dL (ref 0.2–1.2)
Total Protein: 8.1 g/dL (ref 6.1–8.1)

## 2020-01-28 LAB — CBC
HCT: 40.6 % (ref 35.0–45.0)
Hemoglobin: 13.7 g/dL (ref 11.7–15.5)
MCH: 30.4 pg (ref 27.0–33.0)
MCHC: 33.7 g/dL (ref 32.0–36.0)
MCV: 90.2 fL (ref 80.0–100.0)
MPV: 11.9 fL (ref 7.5–12.5)
Platelets: 291 10*3/uL (ref 140–400)
RBC: 4.5 10*6/uL (ref 3.80–5.10)
RDW: 13.1 % (ref 11.0–15.0)
WBC: 5.8 10*3/uL (ref 3.8–10.8)

## 2020-01-28 LAB — LIPID PANEL
Cholesterol: 224 mg/dL — ABNORMAL HIGH (ref <200)
HDL: 83 mg/dL (ref >=50)
LDL Cholesterol (Calc): 117 mg/dL (calc) — ABNORMAL HIGH
Non-HDL Cholesterol (Calc): 141 mg/dL (calc) — ABNORMAL HIGH (ref <130)
Total CHOL/HDL Ratio: 2.7 (calc) (ref <5.0)
Triglycerides: 127 mg/dL (ref <150)

## 2020-01-28 LAB — HIV-1 RNA QUANT-NO REFLEX-BLD
HIV 1 RNA Quant: <20 copies/mL
HIV-1 RNA Quant, Log: <1.3 Log copies/mL

## 2020-01-28 LAB — RPR: RPR Ser Ql: REACTIVE — AB

## 2020-01-28 LAB — RPR TITER: RPR Titer: 1:32 {titer} — ABNORMAL HIGH

## 2020-01-28 LAB — FLUORESCENT TREPONEMAL AB(FTA)-IGG-BLD: Fluorescent Treponemal ABS: REACTIVE — AB

## 2020-02-06 ENCOUNTER — Telehealth: Payer: Self-pay | Admitting: Internal Medicine

## 2020-02-06 ENCOUNTER — Ambulatory Visit: Payer: Self-pay | Admitting: Internal Medicine

## 2020-02-06 NOTE — Telephone Encounter (Signed)
Called the pt said she cannot walk so she cant walk to the bus stop and get on the bus.

## 2020-02-06 NOTE — Telephone Encounter (Signed)
Pt called stating that she has gout in her foot and cant walk

## 2020-02-07 NOTE — Telephone Encounter (Signed)
Called the pt and she doesn't want a tele because it cost her over 100 and her uncle just past at 1 am

## 2020-02-08 ENCOUNTER — Telehealth: Payer: Self-pay

## 2020-02-08 NOTE — Telephone Encounter (Signed)
COVID-19 Pre-Screening Questions:02/08/20  Do you currently have a fever (>100 F), chills or unexplained body aches? NO  Are you currently experiencing new cough, shortness of breath, sore throat, runny nose? NO .  Have you recently travelled outside the state of [place] in the last 14 days? NO .  Have you been in contact with someone that is currently pending confirmation of Covid19 testing or has been confirmed to have the [place] virus?  NO  **If the patient answers NO to ALL questions -  advise the patient to please call the clinic before coming to the office should any symptoms develop.

## 2020-02-09 ENCOUNTER — Other Ambulatory Visit: Payer: Self-pay

## 2020-02-09 ENCOUNTER — Emergency Department (HOSPITAL_COMMUNITY)
Admission: EM | Admit: 2020-02-09 | Discharge: 2020-02-09 | Disposition: A | Discharge status: Home / Self Care | Payer: Self-pay | Attending: Emergency Medicine | Admitting: Emergency Medicine

## 2020-02-09 ENCOUNTER — Ambulatory Visit: Payer: Self-pay | Admitting: Family

## 2020-02-09 ENCOUNTER — Emergency Department (HOSPITAL_COMMUNITY): Payer: Self-pay

## 2020-02-09 ENCOUNTER — Telehealth: Payer: Self-pay

## 2020-02-09 ENCOUNTER — Encounter (HOSPITAL_COMMUNITY): Payer: Self-pay

## 2020-02-09 DIAGNOSIS — Z7982 Long term (current) use of aspirin: Secondary | ICD-10-CM | POA: Insufficient documentation

## 2020-02-09 DIAGNOSIS — M79671 Pain in right foot: Secondary | ICD-10-CM | POA: Insufficient documentation

## 2020-02-09 DIAGNOSIS — F17210 Nicotine dependence, cigarettes, uncomplicated: Secondary | ICD-10-CM | POA: Insufficient documentation

## 2020-02-09 DIAGNOSIS — I10 Essential (primary) hypertension: Secondary | ICD-10-CM | POA: Insufficient documentation

## 2020-02-09 MED ORDER — DICLOFENAC SODIUM 1 % EX GEL: 2.0000 g | Freq: Four times a day (QID) | CUTANEOUS | 0 refills | Status: DC

## 2020-02-09 MED ORDER — PREDNISONE 50 MG PO TABS: 50.0000 mg | ORAL_TABLET | Freq: Every day | ORAL | 0 refills | Status: AC

## 2020-02-09 NOTE — ED Provider Notes (Signed)
[facility] EMERGENCY DEPARTMENT Provider Note   CSN: [id number] Arrival date & time: 02/09/20  1047     History Chief Complaint  Patient presents with  . Foot Pain    [place] is a 49 y.o. female presenting for evaluation of right foot pain.  Patient states over the past several days, she has had right foot pain and swelling.  She denies trauma fall, or injury.  She denies numbness or tingling.  She has been taking Advil and ibuprofen without improvement of symptoms.  She has not tried anything else.  She denies fevers, chills, warmth.  She is concerned this is gout, but she has no history of the same.  She reports a history of HIV which is well controlled, no other medical problems.  She denies symptoms on the left side.  She has minimal to no pain at rest, pain is present with palpation, weightbearing, and movement. No radiation of pain.   HPI     Past Medical History:  Diagnosis Date  . Acid reflux   . Elevated serum creatinine 10/05/2017  . HIV (human immunodeficiency virus infection) ([place])   . Hypertension     Patient Active Problem List   Diagnosis Date Noted  . Mild depression ([place]) 11/11/2019  . Mild protein-energy malnutrition ([place]) 11/09/2019  . HIV disease ([place]) 10/05/2017  . Healthcare maintenance 10/05/2017  . Tobacco dependence 01/17/2015  . Family history of diabetes mellitus (DM) 01/17/2015    Past Surgical History:  Procedure Laterality Date  . I & D EXTREMITY Right 08/30/2017   Procedure: IRRIGATION AND DEBRIDEMENT INDEX FINGER;  Surgeon: Netta Cedars, MD;  Location: [facility];  Service: Orthopedics;  Laterality: Right;     OB History   No obstetric history on file.     Family History  Problem Relation Age of Onset  . Hypertension Mother   . Diabetes Mother   . Lung cancer Maternal Grandmother   . Lung cancer Maternal Uncle     Social History   Tobacco Use  . Smoking status: Current Every Day Smoker    Packs/day:  0.50  . Smokeless tobacco: Never Used  Substance Use Topics  . Alcohol use: Yes  . Drug use: No    Home Medications Prior to Admission medications   Medication Sig Start Date End Date Taking? Authorizing Provider  acetaminophen (TYLENOL) 500 MG tablet Take 1,000 mg every 6 (six) hours as needed by mouth for moderate pain.    [provider]  albuterol (PROVENTIL HFA;VENTOLIN HFA) 108 (90 Base) MCG/ACT inhaler Inhale 2 puffs into the lungs every 6 (six) hours as needed for wheezing or shortness of breath. Patient not taking: Reported on 11/11/2019 11/21/15   Tresa Garter, MD  aspirin EC 81 MG tablet Take 1 tablet (81 mg total) by mouth daily. 10/05/17   High Amana Callas, NP  bictegravir-emtricitabine-tenofovir AF (BIKTARVY) 50-200-25 MG TABS tablet TAKE 1 TABLET BY MOUTH DAILY AT SAME TIME EACH DAY WITH OR WITHOUT FOOD 10/11/19   Golden Circle, FNP  diclofenac Sodium (VOLTAREN) 1 % GEL Apply 2 g topically 4 (four) times daily. 02/09/20   Eulice Rutledge, PA-C  diphenhydrAMINE (BENADRYL) 25 mg capsule Take 25 mg every 6 (six) hours as needed by mouth for allergies.    [provider]  doxycycline (VIBRA-TABS) 100 MG tablet Take 1 tablet (100 mg total) by mouth 2 (two) times daily. 01/27/20   Golden Circle, FNP  Ensure (ENSURE) Take [phone]  mLs by mouth 2 (two) times daily between meals. Drink Ensure at least 4 hours before or after taking your Biktarvy. 11/08/19   Golden Circle, FNP  omeprazole (PRILOSEC) 20 MG capsule TAKE ONE CAPSULE BY MOUTH EVERY DAY BEFORE BREAKFAST(WAIT 30 MINUTES BEFORE EATING) 10/05/19   Golden Circle, FNP  ondansetron (ZOFRAN ODT) 4 MG disintegrating tablet Take 1 tablet (4 mg total) by mouth every 8 (eight) hours as needed for nausea or vomiting. Patient not taking: Reported on 11/11/2019 11/17/17   Kuppelweiser, Cassie L, RPH-CPP  predniSONE (DELTASONE) 50 MG tablet Take 1 tablet (50 mg total) by mouth daily for 5 days. 02/09/20 02/14/20   Thanh Mottern, PA-C  sertraline (ZOLOFT) 50 MG tablet Take 1 tablet (50 mg total) by mouth daily. TAKE 1/2 TABLET BY MOUTH DAILY FOR 2 WEEKS, THEN TAKE 1 TABLET BY MOUTH DAILY. 01/27/20   Ladell Pier, MD  tetrahydrozoline 0.05 % ophthalmic solution Place 1 drop daily as needed into both eyes (dry itchy eyes).    [provider]    Allergies    Ampicillin, Penicillins, Shrimp [shellfish allergy], Codeine, and Hydrocodone  Review of Systems   Review of Systems  Musculoskeletal: Positive for arthralgias and joint swelling.    Physical Exam Updated Vital Signs BP 132/68   Pulse 73   Temp 98.4 F (36.9 C) (Oral)   Resp 16   Ht 4\' 10"  (1.473 m)   Wt 57.6 kg   SpO2 100%   BMI 26.54 kg/m   Physical Exam Vitals and nursing note reviewed.  Constitutional:      General: She is not in acute distress.    Appearance: She is well-developed.  HENT:     Head: Normocephalic and atraumatic.  Pulmonary:     Effort: Pulmonary effort is normal.  Abdominal:     General: There is no distension.  Musculoskeletal:        General: Swelling and tenderness present. Normal range of motion.     Cervical back: Normal range of motion.     Comments: Mild swelling of distal R foot. TTP of distal metatarsals. No erythema or warmth. No induration or fluctuance. Pedal pulses 2+ bilaterally. No focal ttp over MTP. Good distal sensation and cap refill  Skin:    General: Skin is warm.     Capillary Refill: Capillary refill takes less than 2 seconds.     Findings: No rash.  Neurological:     Mental Status: She is alert and oriented to person, place, and time.     ED Results / Procedures / Treatments   Labs (all labs ordered are listed, but only abnormal results are displayed) Labs Reviewed - No data to display  EKG None  Radiology DG Foot Complete Right  Result Date: 02/09/2020 CLINICAL DATA:  Pain and swelling. EXAM: RIGHT FOOT COMPLETE - 3+ VIEW COMPARISON:  No prior. FINDINGS:  No acute bony or joint abnormality identified. No evidence of fracture or dislocation. No radiopaque foreign body. IMPRESSION: No acute bony or joint abnormality identified. Electronically Signed   By: Marcello Moores  Register   On: 02/09/2020 11:49    Procedures Procedures (including critical care time)  Medications Ordered in ED Medications - No data to display  ED Course  I have reviewed the triage vital signs and the nursing notes.  Pertinent labs & imaging results that were available during my care of the patient were reviewed by me and considered in my medical decision making (see chart for details).  MDM Rules/Calculators/A&P                      Patient presenting for evaluation of right foot pain and swelling.  On exam, patient is neurovascularly intact.  She does have some mild swelling and tenderness.  No erythema, warmth, or signs of infection.  Exam is not consistent with gout.  X-rays obtained from triage read interpreted by me, no fracture dislocation.  Discussed with patient.  Discussed likely inflammatory condition/cause of pain.  As he does not improving with NSAIDs, will give short course of steroids.  Will also have patient use Voltaren gel as needed.  Encouraged patient to follow-up with primary care if symptoms not improving.  Crutches given for symptom control.  At this time, patient appears safe for discharge.  Return precautions given.  Patient states she understands and agrees to plan.  Final Clinical Impression(s) / ED Diagnoses Final diagnoses:  Right foot pain    Rx / DC Orders ED Discharge Orders         Ordered    predniSONE (DELTASONE) 50 MG tablet  Daily     02/09/20 1229    diclofenac Sodium (VOLTAREN) 1 % GEL  4 times daily     02/09/20 1229           Franchot Heidelberg, PA-C 02/09/20 1327    Tegeler, Gwenyth Allegra, MD 02/09/20 1615

## 2020-02-09 NOTE — ED Notes (Signed)
Patient verbalizes understanding of discharge instructions . Opportunity for questions and answers were provided . Armband removed by staff ,Pt discharged from ED. W/C  offered at D/C  and Declined W/C at D/C and was escorted to lobby by RN.  

## 2020-02-09 NOTE — Discharge Instructions (Signed)
Take prednisone as prescribed.  Take entire course, even if your symptoms improve. Do not take other anti-inflammatories at the same time such as ibuprofen, Aleve, or Advil. You may supplement with Tylenol as needed for further pain control. Use Voltaren gel to help with pain and swelling. Use crutches as needed for pain control, but if you do not need them to walk, you do not need to use them. Follow-up with your primary care doctor in 1 week if symptoms are not improving. Return to the emergency room if you develop fevers, redness and warmth of your foot, color change of your foot, or any new, worsening, or concerning symptoms.

## 2020-02-09 NOTE — Telephone Encounter (Signed)
Patient called office inform Terri Piedra, FNP she is at [facility] for right foot pain/swelling. States she just had x-rays.  [place]

## 2020-02-09 NOTE — ED Triage Notes (Signed)
Pt presents to ED with Right foot pain x2-3 days, unable to bare weight requesting crutches

## 2020-02-11 ENCOUNTER — Other Ambulatory Visit: Payer: Self-pay | Admitting: Family

## 2020-02-11 DIAGNOSIS — K219 Gastro-esophageal reflux disease without esophagitis: Secondary | ICD-10-CM

## 2020-02-13 ENCOUNTER — Ambulatory Visit: Payer: Self-pay | Attending: Internal Medicine

## 2020-02-13 DIAGNOSIS — Z23 Encounter for immunization: Secondary | ICD-10-CM

## 2020-02-13 NOTE — Progress Notes (Signed)
   Covid-19 Vaccination Clinic  Name:  ARZU ASPLUND    MRN: [id number] DOB: September 29, 1971  02/13/2020  Ms. Weghorst was observed post Covid-19 immunization for 15 minutes without incident. She was provided with Vaccine Information Sheet and instruction to access the V-Safe system.   Ms. Casterline was instructed to call 911 with any severe reactions post vaccine: Marland Kitchen Difficulty breathing  . Swelling of face and throat  . A fast heartbeat  . A bad rash all over body  . Dizziness and weakness   Immunizations Administered    Name Date Dose VIS Date Route   Pfizer COVID-19 Vaccine 02/13/2020  1:48 PM 0.3 mL 12/14/2018 Intramuscular   Manufacturer: [place]   Lot: [id number]   [place]: [id number]

## 2020-02-15 ENCOUNTER — Ambulatory Visit (INDEPENDENT_AMBULATORY_CARE_PROVIDER_SITE_OTHER): Payer: Self-pay | Admitting: Family

## 2020-02-15 ENCOUNTER — Other Ambulatory Visit: Payer: Self-pay

## 2020-02-15 ENCOUNTER — Encounter: Payer: Self-pay | Admitting: Family

## 2020-02-15 VITALS — Ht 59.0 in | Wt 122.0 lb

## 2020-02-15 DIAGNOSIS — A515 Early syphilis, latent: Secondary | ICD-10-CM

## 2020-02-15 DIAGNOSIS — B20 Human immunodeficiency virus [HIV] disease: Secondary | ICD-10-CM

## 2020-02-15 DIAGNOSIS — M79671 Pain in right foot: Secondary | ICD-10-CM

## 2020-02-15 MED ORDER — CLINDAMYCIN HCL 300 MG PO CAPS: 300.0000 mg | ORAL_CAPSULE | Freq: Three times a day (TID) | ORAL | 0 refills | Status: DC

## 2020-02-15 NOTE — Progress Notes (Signed)
Subjective:    Patient ID: Michelle Waters, female    DOB: Sep 15, 1971, 49 y.o.   MRN: [id number]  Chief Complaint  Patient presents with  . HIV Positive/AIDS  . Foot Pain     HPI:  Michelle Waters is a 49 y.o. female with HIV disease who was last seen in the office on 10/11/2019 with good adherence and tolerance to Michelle Waters a ART regimen of Biktarvy.  Blood work at the time showed a viral load that was undetectable with CD4 count of 904.  Most recent blood work completed on 01/26/2020 with a viral load that remains undetectable and CD4 count of 824.  RPR previously nonreactive on 03/07/2019 and now positive at 1: 32.  She was started on doxycycline.  Michelle Waters continues to take Michelle Waters Biktarvy as prescribed with no adverse side effects or missed doses since Michelle Waters last office visit.  Overall feeling well with concern for new onset foot pain for which she was seen in the ED on 02/09/2020 that has been refractory to the diclofenac and prednisone that she was prescribed.  X-rays reviewed and negative for fracture.  Pain is located primarily in the fourth and fifth metatarsals.  Severity is enough to affect Michelle Waters ambulation as she is using 1 crutch presently.  No trauma or injury that she can recall.  Denies fevers, chills, night sweats, headaches, changes in vision, neck pain/stiffness, nausea, diarrhea, vomiting, lesions or rashes.  Michelle Waters has no problems obtaining Michelle Waters medication from the pharmacy and remains covered through [employer].  Denies feelings of being down, depressed, or hopeless recently.  No recreational or illicit drug use; alcohol on occasion; and remains a current everyday smoker at about 1/2 pack/day.   Allergies  Allergen Reactions  . Ampicillin Swelling and Rash  . Penicillins Swelling and Rash  . Shrimp [Shellfish Allergy] Swelling  . Codeine Itching  . Hydrocodone Itching      Outpatient Medications Prior to Visit  Medication Sig Dispense Refill  . acetaminophen (TYLENOL) 500 MG  tablet Take 1,000 mg every 6 (six) hours as needed by mouth for moderate pain.    Marland Kitchen albuterol (PROVENTIL HFA;VENTOLIN HFA) 108 (90 Base) MCG/ACT inhaler Inhale 2 puffs into the lungs every 6 (six) hours as needed for wheezing or shortness of breath. 54 g 3  . bictegravir-emtricitabine-tenofovir AF (BIKTARVY) 50-200-25 MG TABS tablet TAKE 1 TABLET BY MOUTH DAILY AT SAME TIME EACH DAY WITH OR WITHOUT FOOD 30 tablet 5  . diclofenac Sodium (VOLTAREN) 1 % GEL Apply 2 g topically 4 (four) times daily. 100 g 0  . diphenhydrAMINE (BENADRYL) 25 mg capsule Take 25 mg every 6 (six) hours as needed by mouth for allergies.    Marland Kitchen doxycycline (VIBRA-TABS) 100 MG tablet Take 1 tablet (100 mg total) by mouth 2 (two) times daily. 28 tablet 0  . Ensure (ENSURE) Take 237 mLs by mouth 2 (two) times daily between meals. Drink Ensure at least 4 hours before or after taking your Biktarvy. 237 mL 5  . omeprazole (PRILOSEC) 20 MG capsule TAKE ONE CAPSULE BY MOUTH EVERY DAY BEFORE BREAKFAST(WAIT 30 MINUTES BEFORE EATING) 30 capsule 4  . sertraline (ZOLOFT) 50 MG tablet Take 1 tablet (50 mg total) by mouth daily. TAKE 1/2 TABLET BY MOUTH DAILY FOR 2 WEEKS, THEN TAKE 1 TABLET BY MOUTH DAILY. 90 tablet 0  . tetrahydrozoline 0.05 % ophthalmic solution Place 1 drop daily as needed into both eyes (dry itchy eyes).    . ondansetron (  ZOFRAN ODT) 4 MG disintegrating tablet Take 1 tablet (4 mg total) by mouth every 8 (eight) hours as needed for nausea or vomiting. (Patient not taking: Reported on 11/11/2019) 20 tablet 0  . aspirin EC 81 MG tablet Take 1 tablet (81 mg total) by mouth daily. (Patient not taking: Reported on 02/15/2020) 30 tablet 0   No facility-administered medications prior to visit.     Past Medical History:  Diagnosis Date  . Acid reflux   . Elevated serum creatinine 10/05/2017  . HIV (human immunodeficiency virus infection) ([place])   . Hypertension      Past Surgical History:  Procedure Laterality Date  . I & D  EXTREMITY Right 08/30/2017   Procedure: IRRIGATION AND DEBRIDEMENT INDEX FINGER;  Surgeon: Netta Cedars, MD;  Location: [facility];  Service: Orthopedics;  Laterality: Right;       Review of Systems  Constitutional: Negative for appetite change, chills, diaphoresis, fatigue, fever and unexpected weight change.  Eyes:       Negative for acute change in vision  Respiratory: Negative for chest tightness, shortness of breath and wheezing.   Cardiovascular: Negative for chest pain.  Gastrointestinal: Negative for diarrhea, nausea and vomiting.  Genitourinary: Negative for dysuria, pelvic pain and vaginal discharge.  Musculoskeletal: Negative for neck pain and neck stiffness.       Positive for right foot pain  Skin: Negative for rash.  Neurological: Negative for seizures, syncope, weakness and headaches.  Hematological: Negative for adenopathy. Does not bruise/bleed easily.  Psychiatric/Behavioral: Negative for hallucinations.      Objective:    Ht 4\' 11"  (1.499 m)   Wt 122 lb (55.3 kg)   BMI 24.64 kg/m  Nursing note and vital signs reviewed.  Physical Exam Constitutional:      General: She is not in acute distress.    Appearance: She is well-developed.  Eyes:     Conjunctiva/sclera: Conjunctivae normal.  Cardiovascular:     Rate and Rhythm: Normal rate and regular rhythm.     Heart sounds: Normal heart sounds. No murmur. No friction rub. No gallop.   Pulmonary:     Effort: Pulmonary effort is normal. No respiratory distress.     Breath sounds: Normal breath sounds. No wheezing or rales.  Chest:     Chest wall: No tenderness.  Abdominal:     General: Bowel sounds are normal.     Palpations: Abdomen is soft.     Tenderness: There is no abdominal tenderness.  Musculoskeletal:     Cervical back: Neck supple.     Comments: Right foot with obvious edema and no discoloration or deformity.  Tenderness over the fourth and fifth metatarsals.  No crepitus or deformity.  Distal  capillary refill intact and appropriate.  Lymphadenopathy:     Cervical: No cervical adenopathy.  Skin:    General: Skin is warm and dry.     Findings: No rash.  Neurological:     Mental Status: She is alert and oriented to person, place, and time.  Psychiatric:        Behavior: Behavior normal.        Thought Content: Thought content normal.        Judgment: Judgment normal.      Depression screen [facility] 2/9 11/11/2019 04/16/2018 12/29/2017 10/05/2017 01/17/2015  Decreased Interest 1 0 0 0 0  Down, Depressed, Hopeless 1 0 0 2 0  PHQ - 2 Score 2 0 0 2 0  Altered sleeping 1 - - 0 -  Tired, decreased energy 0 - - 1 -  Change in appetite 1 - - 1 -  Feeling bad or failure about yourself  1 - - 1 -  Trouble concentrating 1 - - 2 -  Moving slowly or fidgety/restless 0 - - 0 -  Suicidal thoughts 0 - - 0 -  PHQ-9 Score 6 - - 7 -  Difficult doing work/chores - - - Somewhat difficult -       Assessment & Plan:    Patient Active Problem List   Diagnosis Date Noted  . Right foot pain 02/15/2020  . 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 Mild depression ([place]) 11/11/2019  . Mild protein-energy malnutrition ([place]) 11/09/2019  . HIV disease ([place]) 10/05/2017  . Healthcare maintenance 10/05/2017  . Tobacco dependence 01/17/2015  . Family history of diabetes mellitus (DM) 01/17/2015     Problem List Items Addressed This Visit      Other   HIV disease ([place])    Michelle Waters continues to have well-controlled HIV disease with good adherence and tolerance to Michelle Waters ART regimen of Biktarvy.  No signs/symptoms of opportunistic infection or progressive HIV disease.  We reviewed Michelle Waters lab work and discussed the plan of care.  Continue current dose of Biktarvy.  Plan for follow-up in 4 months or sooner if needed with lab work 1 to 2 weeks prior to appointment.      Relevant Medications   clindamycin (CLEOCIN) 300 MG capsule   Right foot pain    Michelle Waters has acute onset right foot pain of approximately  5-day duration with no injury or trauma.  X-rays negative for fracture.  Does not appear to have infection although cellulitis is a possibility.  We will add 4 days of clindamycin to cover for strep.  Treat conservatively with ice, compression, elevation and rest.  Advised to seek further care if symptoms worsen or do not improve.      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  She is on doxycycline previously prescribed.  Advised to complete course of doxycycline.  Repeat RPR in 3 months and continue to monitor.      Relevant Medications   clindamycin (CLEOCIN) 300 MG capsule       I have discontinued [employer] tetrahydrozoline and aspirin EC. I am also having Michelle Waters start on clindamycin. Additionally, I am having Michelle Waters maintain Michelle Waters albuterol, acetaminophen, diphenhydrAMINE, ondansetron, Biktarvy, Ensure, sertraline, doxycycline, diclofenac Sodium, and omeprazole.   Meds ordered this encounter  Medications  . clindamycin (CLEOCIN) 300 MG capsule    Sig: Take 1 capsule (300 mg total) by mouth 3 (three) times daily.    Dispense:  12 capsule    Refill:  0    Please mail to patient.    Order Specific Question:   Supervising Provider    Answer:   Carlyle Basques [[id number]]     Follow-up: Return in about 4 months (around 06/16/2020), or if symptoms worsen or fail to improve.   Terri Piedra, MSN, FNP-C Nurse Practitioner [facility] for [facility] [place] number: [phone]

## 2020-02-15 NOTE — Assessment & Plan Note (Signed)
Michelle Waters has acute onset right foot pain of approximately 5-day duration with no injury or trauma.  X-rays negative for fracture.  Does not appear to have infection although cellulitis is a possibility.  We will add 4 days of clindamycin to cover for strep.  Treat conservatively with ice, compression, elevation and rest.  Advised to seek further care if symptoms worsen or do not improve.

## 2020-02-15 NOTE — Patient Instructions (Addendum)
Nice to see you.  Continue to take your [place] daily.  Continue the doxycycline and start taking Clindamycin 3 x per day until complete.   Recommend orthopedic urgent care if your symptoms do not improve.  Ice x 20 minutes every 2 hours as needed with elevation and compression.  Plan for follow up in 4 months or sooner if needed.

## 2020-02-15 NOTE — Assessment & Plan Note (Signed)
Ms. Hemming has early latent syphilis with positive RPR titer 1: 32.  She is on doxycycline previously prescribed.  Advised to complete course of doxycycline.  Repeat RPR in 3 months and continue to monitor.

## 2020-02-15 NOTE — Assessment & Plan Note (Signed)
Michelle Waters continues to have well-controlled HIV disease with good adherence and tolerance to her ART regimen of Biktarvy.  No signs/symptoms of opportunistic infection or progressive HIV disease.  We reviewed her lab work and discussed the plan of care.  Continue current dose of Biktarvy.  Plan for follow-up in 4 months or sooner if needed with lab work 1 to 2 weeks prior to appointment.

## 2020-04-02 ENCOUNTER — Other Ambulatory Visit: Payer: Self-pay

## 2020-04-02 ENCOUNTER — Encounter: Payer: Self-pay | Admitting: Internal Medicine

## 2020-04-02 ENCOUNTER — Ambulatory Visit: Payer: Self-pay | Attending: Internal Medicine | Admitting: Internal Medicine

## 2020-04-02 VITALS — BP 129/85 | HR 75 | Temp 97.0°F | Resp 16 | Ht 62.0 in | Wt 119.6 lb

## 2020-04-02 DIAGNOSIS — R87612 Low grade squamous intraepithelial lesion on cytologic smear of cervix (LGSIL): Secondary | ICD-10-CM

## 2020-04-02 DIAGNOSIS — R030 Elevated blood-pressure reading, without diagnosis of hypertension: Secondary | ICD-10-CM

## 2020-04-02 DIAGNOSIS — Z1231 Encounter for screening mammogram for malignant neoplasm of breast: Secondary | ICD-10-CM

## 2020-04-02 DIAGNOSIS — F32A Depression, unspecified: Secondary | ICD-10-CM

## 2020-04-02 DIAGNOSIS — Z1211 Encounter for screening for malignant neoplasm of colon: Secondary | ICD-10-CM

## 2020-04-02 DIAGNOSIS — F320 Major depressive disorder, single episode, mild: Secondary | ICD-10-CM

## 2020-04-02 DIAGNOSIS — F17200 Nicotine dependence, unspecified, uncomplicated: Secondary | ICD-10-CM

## 2020-04-02 NOTE — Progress Notes (Signed)
Patient ID: [name], female    DOB: [birth year]  MRN: [id number]  CC: Annual Exam   Subjective: Michelle Waters is a 49 y.o. female who presents for annual exam.  Last eval 10/2019 Her concerns today include:  Pt with hx of HIV, tob dep, depression, syphilis (treated)  Depression:  Feels she is doing okay on zoloft.  She continues to take half a tablet a day and never increased to a full tablet.  She does not feel that she needs an increased dose at this time.    Tob dep:  Started smoking again.  1pk/wk. she is not ready to give a trial of quitting anymore.  HIV: tolerating Biktarvy, followed by ID.  Viral load undeteched when last checked 2 months ago  HM: due for colon cancer screen, MMG and Tdap.  Last Pap smear was in 2019.  This revealed LSIL with HPV positive.  She was referred to gynecology but patient no showed the appointment. Patient Active Problem List   Diagnosis Date Noted  . Right foot pain 02/15/2020  . 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 Mild depression ([place]) 11/11/2019  . Mild protein-energy malnutrition ([place]) 11/09/2019  . HIV disease ([place]) 10/05/2017  . Healthcare maintenance 10/05/2017  . Tobacco dependence 01/17/2015  . Family history of diabetes mellitus (DM) 01/17/2015     Current Outpatient Medications on File Prior to Visit  Medication Sig Dispense Refill  . bictegravir-emtricitabine-tenofovir AF (BIKTARVY) 50-200-25 MG TABS tablet TAKE 1 TABLET BY MOUTH DAILY AT SAME TIME EACH DAY WITH OR WITHOUT FOOD 30 tablet 5  . omeprazole (PRILOSEC) 20 MG capsule TAKE ONE CAPSULE BY MOUTH EVERY DAY BEFORE BREAKFAST(WAIT 30 MINUTES BEFORE EATING) 30 capsule 4  . acetaminophen (TYLENOL) 500 MG tablet Take 1,000 mg every 6 (six) hours as needed by mouth for moderate pain. (Patient not taking: Reported on 04/02/2020)    . albuterol (PROVENTIL HFA;VENTOLIN HFA) 108 (90 Base) MCG/ACT inhaler Inhale 2 puffs into the lungs every 6 (six) hours as needed for wheezing or  shortness of breath. (Patient not taking: Reported on 04/02/2020) 54 g 3  . diclofenac Sodium (VOLTAREN) 1 % GEL Apply 2 g topically 4 (four) times daily. (Patient not taking: Reported on 04/02/2020) 100 g 0  . sertraline (ZOLOFT) 50 MG tablet Take 1 tablet (50 mg total) by mouth daily. TAKE 1/2 TABLET BY MOUTH DAILY FOR 2 WEEKS, THEN TAKE 1 TABLET BY MOUTH DAILY. (Patient not taking: Reported on 04/02/2020) 90 tablet 0   No current facility-administered medications on file prior to visit.    Allergies  Allergen Reactions  . Ampicillin Swelling and Rash  . Penicillins Swelling and Rash  . Shrimp [Shellfish Allergy] Swelling  . Codeine Itching  . Hydrocodone Itching    Social History   Socioeconomic History  . Marital status: Married    Spouse name: Not on file  . Number of children: Not on file  . Years of education: Not on file  . Highest education level: Not on file  Occupational History  . Not on file  Tobacco Use  . Smoking status: Current Every Day Smoker    Packs/day: 0.50  . Smokeless tobacco: Never Used  Vaping Use  . Vaping Use: Never used  Substance and Sexual Activity  . Alcohol use: Yes  . Drug use: No  . Sexual activity: Yes    Partners: Male    Birth control/protection: Condom    Comment: offered condoms  Other Topics Concern  .  Not on file  Social History Narrative  . Not on file   Social Determinants of Health   Financial Resource Strain:   . Difficulty of Paying Living Expenses:   Food Insecurity:   . Worried About Charity fundraiser in the Last Year:   . Arboriculturist in the Last Year:   Transportation Needs:   . Film/video editor (Medical):   Marland Kitchen Lack of Transportation (Non-Medical):   Physical Activity:   . Days of Exercise per Week:   . Minutes of Exercise per Session:   Stress:   . Feeling of Stress :   Social Connections:   . Frequency of Communication with Friends and Family:   . Frequency of Social Gatherings with Friends and  Family:   . Attends Religious Services:   . Active Member of Clubs or Organizations:   . Attends Archivist Meetings:   Marland Kitchen Marital Status:   Intimate Partner Violence:   . Fear of Current or Ex-Partner:   . Emotionally Abused:   Marland Kitchen Physically Abused:   . Sexually Abused:     Family History  Problem Relation Age of Onset  . Hypertension Mother   . Diabetes Mother   . Lung cancer Maternal Grandmother   . Lung cancer Maternal Uncle     Past Surgical History:  Procedure Laterality Date  . I & D EXTREMITY Right 08/30/2017   Procedure: IRRIGATION AND DEBRIDEMENT INDEX FINGER;  Surgeon: Netta Cedars, MD;  Location: [place];  Service: Orthopedics;  Laterality: Right;    ROS: Review of Systems Negative except as stated above  PHYSICAL EXAM: BP 129/85   Pulse 75   Temp (!) 97 F (36.1 C)   Resp 16   Ht '5\' 2"'  (1.575 m)   Wt 119 lb 9.6 oz (54.3 kg)   SpO2 100%   BMI 21.88 kg/m   Physical Exam  General appearance - alert, well appearing, and in no distress Mental status - normal mood, behavior, speech, dress, motor activity, and thought processes Chest - clear to auscultation, no wheezes, rales or rhonchi, symmetric air entry Heart - normal rate, regular rhythm, normal S1, S2, no murmurs, rubs, clicks or gallops Extremities - peripheral pulses normal, no pedal edema, no clubbing or cyanosis  Depression screen [facility] 2/9 04/02/2020 11/11/2019 04/16/2018  Decreased Interest 0 1 0  Down, Depressed, Hopeless 0 1 0  PHQ - 2 Score 0 2 0  Altered sleeping - 1 -  Tired, decreased energy - 0 -  Change in appetite - 1 -  Feeling bad or failure about yourself  - 1 -  Trouble concentrating - 1 -  Moving slowly or fidgety/restless - 0 -  Suicidal thoughts - 0 -  PHQ-9 Score - 6 -  Difficult doing work/chores - - -    CMP Latest Ref Rng & Units 01/26/2020 07/28/2019 03/07/2019  Glucose 65 - 99 mg/dL 100(H) 107(H) 80  BUN 7 - 25 mg/dL '16 15 12  ' Creatinine 0.50 - 1.05 mg/dL 0.98  0.98 0.85  Sodium 135 - 146 mmol/L 135 136 139  Potassium 3.5 - 5.3 mmol/L 4.1 4.7 4.3  Chloride 98 - 110 mmol/L 99 100 107  CO2 20 - 32 mmol/L '30 30 28  ' Calcium 8.6 - 10.4 mg/dL 10.1 10.6(H) 9.7  Total Protein 6.1 - 8.1 g/dL 8.1 8.0 6.9  Total Bilirubin 0.2 - 1.2 mg/dL 0.5 0.5 0.3  Alkaline Phos 38 - 126 U/L - - -  AST 10 - 35 U/L 27 33 29  ALT 6 - 29 U/L '16 15 17   ' Lipid Panel     Component Value Date/Time   CHOL 224 (H) 01/26/2020 1047   TRIG 127 01/26/2020 1047   HDL 83 01/26/2020 1047   CHOLHDL 2.7 01/26/2020 1047   VLDL 20 11/02/2015 1159   LDLCALC 117 (H) 01/26/2020 1047    CBC    Component Value Date/Time   WBC 5.8 01/26/2020 1047   RBC 4.50 01/26/2020 1047   HGB 13.7 01/26/2020 1047   HCT 40.6 01/26/2020 1047   PLT 291 01/26/2020 1047   MCV 90.2 01/26/2020 1047   MCH 30.4 01/26/2020 1047   MCHC 33.7 01/26/2020 1047   RDW 13.1 01/26/2020 1047   LYMPHSABS 2,021 09/27/2018 1044   MONOABS 0.3 08/30/2017 1007   EOSABS 53 09/27/2018 1044   BASOSABS 29 09/27/2018 1044    ASSESSMENT AND PLAN:  1. Mild depression ([place]) -Patient feels she is benefiting from them would like to remain on low-dose Zoloft.  2. Tobacco dependence Advised to quit.  Discussed health risks associated with smoking.  Patient not ready to give a trial of quitting.  3. Low grade squamous intraepithelial lesion on cytologic smear of cervix (LGSIL) She no showed the appointment with the gynecologist 2 years ago.  We will plan to do a repeat Pap smear and if still positive, will refer her to gynecology again.  Patient did not want to have the Pap smear done today but is agreeable to returning in 6 weeks to have this done.  Given forms to apply for the orange card/cone discount in the meantime.  4. Elevated blood-pressure reading without diagnosis of hypertension DASH diet discussed and encourage  5. Screening for colon cancer Discussed colon cancer screening.  She is agreeable to doing the fit  test when she is uninsured.  However when my CMA gave her the kit patient states that she will prefer to take it with her on the next visit as she is catching the bus and did not want to be carrying the kit around with her all day  6. Encounter for screening mammogram for malignant neoplasm of breast - MM Digital Screening; Future   Patient was given the opportunity to ask questions.  Patient verbalized understanding of the plan and was able to repeat key elements of the plan.   Orders Placed This Encounter  Procedures  . Fecal occult blood, imunochemical([facility])  . MM Digital Screening     Requested Prescriptions    No prescriptions requested or ordered in this encounter    Return in about 6 weeks (around 05/14/2020) for PAP.  Karle Plumber, MD, FACP

## 2020-04-02 NOTE — Patient Instructions (Signed)
Please call the [facility]CP (breast and cervical cancer control program) at [phone] to schedule diagnostic mammogram

## 2020-04-03 ENCOUNTER — Other Ambulatory Visit: Payer: Self-pay | Admitting: Family

## 2020-04-03 DIAGNOSIS — B20 Human immunodeficiency virus [HIV] disease: Secondary | ICD-10-CM

## 2020-04-22 ENCOUNTER — Other Ambulatory Visit: Payer: Self-pay | Admitting: Internal Medicine

## 2020-04-22 DIAGNOSIS — F32A Depression, unspecified: Secondary | ICD-10-CM

## 2020-04-22 NOTE — Telephone Encounter (Signed)
Requested Prescriptions  Pending Prescriptions Disp Refills  . sertraline (ZOLOFT) 50 MG tablet [Pharmacy Med Name: SERTRALINE 50MG  TABLETS] 90 tablet 1    Sig: TAKE 1/2 TABLET BY MOUTH DAILY FOR 2 WEEKS, THEN TAKE 1 TABLET DAILY     Psychiatry:  Antidepressants - SSRI Passed - 04/22/2020  3:41 AM      Passed - Completed PHQ-2 or PHQ-9 in the last 360 days.      Passed - Valid encounter within last 6 months    Recent Outpatient Visits          2 weeks ago Mild depression [facility])   [place] Ladell Pier, MD   5 months ago Mild depression [facility])   [place], MD   4 years ago SOB (shortness of breath)   [place], NP   4 years ago Papanicolaou smear   [place], NP   5 years ago Essential hypertension   [place], [place], NP      Future Appointments            In 3 weeks Ladell Pier, MD [place]

## 2020-05-14 ENCOUNTER — Encounter: Payer: Self-pay | Admitting: Family

## 2020-05-14 NOTE — Progress Notes (Deleted)
Patient ID: Michelle Waters, female    DOB: May 20, 1971  MRN: [id number]  CC: PAP Smear  Subjective: Michelle Waters is a 49 y.o. female with history of mild depression, low grade squamous intraepithelial lesion on cytologic smear of cervix, and HIV disease who presents for PAP smear.   1. PAP Smear:  Age at menarche: Age at menopause:  LMP: Menses duration, flow, frequency:  Pregnancies: Births:  Abortions:  Miscarriages: Sexual concerns:  Sexually active:  Contraception use past, present:  Previous STIs:  Previous PID:  Vaginal discharge:  Vaginal lesions: Pelvic pain: Uterine fibroids:   Urinary incontinence:  Bowel incontinence:  Previous GYN procedures:  Last PAP: 04/16/2018 Results of last PAP: low grade squamous intraepithelial lesion CIN-1/HPV Testing for STIs today? yes Testing for HIV today? No, last tested 01/26/2020 with positive result Colonoscopy:  Mammogram:    Patient Active Problem List   Diagnosis Date Noted  . Low grade squamous intraepithelial lesion on cytologic smear of cervix (LGSIL) 04/02/2020  . Right foot pain 02/15/2020  . 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 Mild depression ([place]) 11/11/2019  . Mild protein-energy malnutrition ([place]) 11/09/2019  . HIV disease ([place]) 10/05/2017  . Healthcare maintenance 10/05/2017  . Tobacco dependence 01/17/2015  . Family history of diabetes mellitus (DM) 01/17/2015     Current Outpatient Medications on File Prior to Visit  Medication Sig Dispense Refill  . acetaminophen (TYLENOL) 500 MG tablet Take 1,000 mg every 6 (six) hours as needed by mouth for moderate pain. (Patient not taking: Reported on 04/02/2020)    . albuterol (PROVENTIL HFA;VENTOLIN HFA) 108 (90 Base) MCG/ACT inhaler Inhale 2 puffs into the lungs every 6 (six) hours as needed for wheezing or shortness of breath. (Patient not taking: Reported on 04/02/2020) 54 g 3  . bictegravir-emtricitabine-tenofovir AF (BIKTARVY) 50-200-25 MG TABS tablet TAKE 1  TABLET BY MOUTH DAILY AT THE SAME TIME EACH DAY WITH OR WITHOUT FOOD 30 tablet 5  . diclofenac Sodium (VOLTAREN) 1 % GEL Apply 2 g topically 4 (four) times daily. (Patient not taking: Reported on 04/02/2020) 100 g 0  . omeprazole (PRILOSEC) 20 MG capsule TAKE ONE CAPSULE BY MOUTH EVERY DAY BEFORE BREAKFAST(WAIT 30 MINUTES BEFORE EATING) 30 capsule 4  . sertraline (ZOLOFT) 50 MG tablet TAKE 1/2 TABLET BY MOUTH DAILY FOR 2 WEEKS, THEN TAKE 1 TABLET DAILY 90 tablet 1   No current facility-administered medications on file prior to visit.    Allergies  Allergen Reactions  . Ampicillin Swelling and Rash  . Penicillins Swelling and Rash  . Shrimp [Shellfish Allergy] Swelling  . Codeine Itching  . Hydrocodone Itching    Social History   Socioeconomic History  . Marital status: Married    Spouse name: Not on file  . Number of children: Not on file  . Years of education: Not on file  . Highest education level: Not on file  Occupational History  . Not on file  Tobacco Use  . Smoking status: Current Every Day Smoker    Packs/day: 0.50  . Smokeless tobacco: Never Used  Vaping Use  . Vaping Use: Never used  Substance and Sexual Activity  . Alcohol use: Yes  . Drug use: No  . Sexual activity: Yes    Partners: Male    Birth control/protection: Condom    Comment: offered condoms  Other Topics Concern  . Not on file  Social History Narrative  . Not on file   Social Determinants of Health  Financial Resource Strain:   . Difficulty of Paying Living Expenses:   Food Insecurity:   . Worried About Charity fundraiser in the Last Year:   . Arboriculturist in the Last Year:   Transportation Needs:   . Film/video editor (Medical):   Marland Kitchen Lack of Transportation (Non-Medical):   Physical Activity:   . Days of Exercise per Week:   . Minutes of Exercise per Session:   Stress:   . Feeling of Stress :   Social Connections:   . Frequency of Communication with Friends and Family:   .  Frequency of Social Gatherings with Friends and Family:   . Attends Religious Services:   . Active Member of Clubs or Organizations:   . Attends Archivist Meetings:   Marland Kitchen Marital Status:   Intimate Partner Violence:   . Fear of Current or Ex-Partner:   . Emotionally Abused:   Marland Kitchen Physically Abused:   . Sexually Abused:     Family History  Problem Relation Age of Onset  . Hypertension Mother   . Diabetes Mother   . Lung cancer Maternal Grandmother   . Lung cancer Maternal Uncle     Past Surgical History:  Procedure Laterality Date  . I & D EXTREMITY Right 08/30/2017   Procedure: IRRIGATION AND DEBRIDEMENT INDEX FINGER;  Surgeon: Netta Cedars, MD;  Location: [facility];  Service: Orthopedics;  Laterality: Right;    ROS: Review of Systems Negative except as stated above  PHYSICAL EXAM: There were no vitals taken for this visit.  Physical Exam  {female adult master:310786} {female adult master:310785}  CMP Latest Ref Rng & Units 01/26/2020 07/28/2019 03/07/2019  Glucose 65 - 99 mg/dL 100(H) 107(H) 80  BUN 7 - 25 mg/dL 16 15 12   Creatinine 0.50 - 1.05 mg/dL 0.98 0.98 0.85  Sodium 135 - 146 mmol/L 135 136 139  Potassium 3.5 - 5.3 mmol/L 4.1 4.7 4.3  Chloride 98 - 110 mmol/L 99 100 107  CO2 20 - 32 mmol/L 30 30 28   Calcium 8.6 - 10.4 mg/dL 10.1 10.6(H) 9.7  Total Protein 6.1 - 8.1 g/dL 8.1 8.0 6.9  Total Bilirubin 0.2 - 1.2 mg/dL 0.5 0.5 0.3  Alkaline Phos 38 - 126 U/L - - -  AST 10 - 35 U/L 27 33 29  ALT 6 - 29 U/L 16 15 17    Lipid Panel     Component Value Date/Time   CHOL 224 (H) 01/26/2020 1047   TRIG 127 01/26/2020 1047   HDL 83 01/26/2020 1047   CHOLHDL 2.7 01/26/2020 1047   VLDL 20 11/02/2015 1159   LDLCALC 117 (H) 01/26/2020 1047    CBC    Component Value Date/Time   WBC 5.8 01/26/2020 1047   RBC 4.50 01/26/2020 1047   HGB 13.7 01/26/2020 1047   HCT 40.6 01/26/2020 1047   PLT 291 01/26/2020 1047   MCV 90.2 01/26/2020 1047   MCH 30.4 01/26/2020  1047   MCHC 33.7 01/26/2020 1047   RDW 13.1 01/26/2020 1047   LYMPHSABS 2,021 09/27/2018 1044   MONOABS 0.3 08/30/2017 1007   EOSABS 53 09/27/2018 1044   BASOSABS 29 09/27/2018 1044    ASSESSMENT AND PLAN:  There are no diagnoses linked to this encounter.   Patient was given the opportunity to ask questions.  Patient verbalized understanding of the plan and was able to repeat key elements of the plan.   No orders of the defined types were placed in  this encounter.    Requested Prescriptions    No prescriptions requested or ordered in this encounter    No follow-ups on file.  Camillia Herter, NP

## 2020-05-22 ENCOUNTER — Encounter: Payer: Self-pay | Admitting: Family

## 2020-05-23 ENCOUNTER — Other Ambulatory Visit: Payer: Self-pay

## 2020-05-23 ENCOUNTER — Ambulatory Visit: Payer: Self-pay

## 2020-05-25 ENCOUNTER — Other Ambulatory Visit: Payer: Self-pay

## 2020-05-25 ENCOUNTER — Ambulatory Visit: Payer: Self-pay

## 2020-05-28 ENCOUNTER — Other Ambulatory Visit: Payer: Self-pay

## 2020-05-28 ENCOUNTER — Ambulatory Visit: Payer: Self-pay

## 2020-05-28 DIAGNOSIS — B20 Human immunodeficiency virus [HIV] disease: Secondary | ICD-10-CM

## 2020-05-31 ENCOUNTER — Encounter: Payer: Self-pay | Admitting: Family

## 2020-05-31 NOTE — Progress Notes (Deleted)
Patient ID: Michelle Waters, female    DOB: [birth year]  MRN: [id number]  CC: Physical Examination   Subjective: Michelle Waters is a 49 y.o. female with history of HIV disease, mild protein-energy malnutrition, mild depression, right foot pain, early syphilis latent, and low grade squamous intraepithelial lesion on cytologic smear of cervix (LGSIL) who presents for physical examination.      tdap vaccine,  Mammogram  Colonoscopy   Patient Active Problem List   Diagnosis Date Noted  . Low grade squamous intraepithelial lesion on cytologic smear of cervix (LGSIL) 04/02/2020  . Right foot pain 02/15/2020  . 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 Mild depression ([place]) 11/11/2019  . Mild protein-energy malnutrition ([place]) 11/09/2019  . HIV disease ([place]) 10/05/2017  . Healthcare maintenance 10/05/2017  . Tobacco dependence 01/17/2015  . Family history of diabetes mellitus (DM) 01/17/2015     Current Outpatient Medications on File Prior to Visit  Medication Sig Dispense Refill  . acetaminophen (TYLENOL) 500 MG tablet Take 1,000 mg every 6 (six) hours as needed by mouth for moderate pain. (Patient not taking: Reported on 04/02/2020)    . albuterol (PROVENTIL HFA;VENTOLIN HFA) 108 (90 Base) MCG/ACT inhaler Inhale 2 puffs into the lungs every 6 (six) hours as needed for wheezing or shortness of breath. (Patient not taking: Reported on 04/02/2020) 54 g 3  . bictegravir-emtricitabine-tenofovir AF (BIKTARVY) 50-200-25 MG TABS tablet TAKE 1 TABLET BY MOUTH DAILY AT THE SAME TIME EACH DAY WITH OR WITHOUT FOOD 30 tablet 5  . diclofenac Sodium (VOLTAREN) 1 % GEL Apply 2 g topically 4 (four) times daily. (Patient not taking: Reported on 04/02/2020) 100 g 0  . omeprazole (PRILOSEC) 20 MG capsule TAKE ONE CAPSULE BY MOUTH EVERY DAY BEFORE BREAKFAST(WAIT 30 MINUTES BEFORE EATING) 30 capsule 4  . sertraline (ZOLOFT) 50 MG tablet TAKE 1/2 TABLET BY MOUTH DAILY FOR 2 WEEKS, THEN TAKE 1 TABLET DAILY 90 tablet 1     No current facility-administered medications on file prior to visit.    Allergies  Allergen Reactions  . Ampicillin Swelling and Rash  . Penicillins Swelling and Rash  . Shrimp [Shellfish Allergy] Swelling  . Codeine Itching  . Hydrocodone Itching    Social History   Socioeconomic History  . Marital status: Married    Spouse name: Not on file  . Number of children: Not on file  . Years of education: Not on file  . Highest education level: Not on file  Occupational History  . Not on file  Tobacco Use  . Smoking status: Current Every Day Smoker    Packs/day: 0.50  . Smokeless tobacco: Never Used  Vaping Use  . Vaping Use: Never used  Substance and Sexual Activity  . Alcohol use: Yes  . Drug use: No  . Sexual activity: Yes    Partners: Male    Birth control/protection: Condom    Comment: offered condoms  Other Topics Concern  . Not on file  Social History Narrative  . Not on file   Social Determinants of Health   Financial Resource Strain:   . Difficulty of Paying Living Expenses:   Food Insecurity:   . Worried About Charity fundraiser in the Last Year:   . Arboriculturist in the Last Year:   Transportation Needs:   . Film/video editor (Medical):   Marland Kitchen Lack of Transportation (Non-Medical):   Physical Activity:   . Days of Exercise per Week:   . Minutes  of Exercise per Session:   Stress:   . Feeling of Stress :   Social Connections:   . Frequency of Communication with Friends and Family:   . Frequency of Social Gatherings with Friends and Family:   . Attends Religious Services:   . Active Member of Clubs or Organizations:   . Attends Archivist Meetings:   Marland Kitchen Marital Status:   Intimate Partner Violence:   . Fear of Current or Ex-Partner:   . Emotionally Abused:   Marland Kitchen Physically Abused:   . Sexually Abused:     Family History  Problem Relation Age of Onset  . Hypertension Mother   . Diabetes Mother   . Lung cancer Maternal Grandmother    . Lung cancer Maternal Uncle     Past Surgical History:  Procedure Laterality Date  . I & D EXTREMITY Right 08/30/2017   Procedure: IRRIGATION AND DEBRIDEMENT INDEX FINGER;  Surgeon: Netta Cedars, MD;  Location: [place];  Service: Orthopedics;  Laterality: Right;    ROS: Review of Systems Negative except as stated above  PHYSICAL EXAM: There were no vitals taken for this visit.  Physical Exam  {female adult master:310786} {female adult master:310785}  CMP Latest Ref Rng & Units 01/26/2020 07/28/2019 03/07/2019  Glucose 65 - 99 mg/dL 100(H) 107(H) 80  BUN 7 - 25 mg/dL 16 15 12   Creatinine 0.50 - 1.05 mg/dL 0.98 0.98 0.85  Sodium 135 - 146 mmol/L 135 136 139  Potassium 3.5 - 5.3 mmol/L 4.1 4.7 4.3  Chloride 98 - 110 mmol/L 99 100 107  CO2 20 - 32 mmol/L 30 30 28   Calcium 8.6 - 10.4 mg/dL 10.1 10.6(H) 9.7  Total Protein 6.1 - 8.1 g/dL 8.1 8.0 6.9  Total Bilirubin 0.2 - 1.2 mg/dL 0.5 0.5 0.3  Alkaline Phos 38 - 126 U/L - - -  AST 10 - 35 U/L 27 33 29  ALT 6 - 29 U/L 16 15 17    Lipid Panel     Component Value Date/Time   CHOL 224 (H) 01/26/2020 1047   TRIG 127 01/26/2020 1047   HDL 83 01/26/2020 1047   CHOLHDL 2.7 01/26/2020 1047   VLDL 20 11/02/2015 1159   LDLCALC 117 (H) 01/26/2020 1047    CBC    Component Value Date/Time   WBC 5.8 01/26/2020 1047   RBC 4.50 01/26/2020 1047   HGB 13.7 01/26/2020 1047   HCT 40.6 01/26/2020 1047   PLT 291 01/26/2020 1047   MCV 90.2 01/26/2020 1047   MCH 30.4 01/26/2020 1047   MCHC 33.7 01/26/2020 1047   RDW 13.1 01/26/2020 1047   LYMPHSABS 2,021 09/27/2018 1044   MONOABS 0.3 08/30/2017 1007   EOSABS 53 09/27/2018 1044   BASOSABS 29 09/27/2018 1044    ASSESSMENT AND PLAN:  There are no diagnoses linked to this encounter.   Patient was given the opportunity to ask questions.  Patient verbalized understanding of the plan and was able to repeat key elements of the plan.   No orders of the defined types were placed in this  encounter.    Requested Prescriptions    No prescriptions requested or ordered in this encounter    No follow-ups on file.  Camillia Herter, NP

## 2020-06-06 ENCOUNTER — Encounter: Payer: Self-pay | Admitting: Family

## 2020-06-08 ENCOUNTER — Encounter: Payer: Self-pay | Admitting: Family

## 2020-06-08 ENCOUNTER — Other Ambulatory Visit: Payer: Self-pay

## 2020-06-08 ENCOUNTER — Ambulatory Visit (INDEPENDENT_AMBULATORY_CARE_PROVIDER_SITE_OTHER): Payer: Self-pay | Admitting: Family

## 2020-06-08 VITALS — BP 141/89 | HR 103 | Temp 98.2°F | Wt 125.0 lb

## 2020-06-08 DIAGNOSIS — A515 Early syphilis, latent: Secondary | ICD-10-CM

## 2020-06-08 DIAGNOSIS — B20 Human immunodeficiency virus [HIV] disease: Secondary | ICD-10-CM

## 2020-06-08 DIAGNOSIS — Z0000 Encounter for general adult medical examination without abnormal findings: Secondary | ICD-10-CM

## 2020-06-08 LAB — T-HELPER CELL (CD4) - (RCID CLINIC ONLY)
CD4 % Helper T Cell: 44 % (ref 33–65)
CD4 T Cell Abs: 889 /uL (ref 400–1790)

## 2020-06-08 MED ORDER — BIKTARVY 50-200-25 MG PO TABS: ORAL_TABLET | ORAL | 5 refills | Status: DC

## 2020-06-08 NOTE — Assessment & Plan Note (Signed)
·   Covid vaccine up-to-date per recommendations.  Discussed booster dose available, however with current immune system may wait or receive booster dose if she would like.  Discussed importance of safe sexual practice to reduce risk of STIs.  Condoms declined.  Due for colon cancer screening through colonoscopy  We will update tetanus at next office visit.

## 2020-06-08 NOTE — Assessment & Plan Note (Signed)
Michelle Waters continues to have well-controlled HIV disease with good adherence and tolerance to her ART regimen of Biktarvy.  No signs/symptoms of opportunistic infection or progressive HIV disease.  We reviewed previous lab work and discussed the plan of care including Covid vaccine booster.  Financial assistance has been renewed.  Continue current dose of Biktarvy.  Check blood work today.  Plan for follow-up in 4 months or sooner if needed with lab work on the same day.

## 2020-06-08 NOTE — Assessment & Plan Note (Signed)
Michelle Waters was previously treated with RPR titer of 1: 32.  Currently without symptoms.  Recheck RPR today.  Continue to monitor.

## 2020-06-08 NOTE — Progress Notes (Signed)
Subjective:    Patient ID: Michelle Waters, female    DOB: January 07, 1971, 49 y.o.   MRN: [id number]  Chief Complaint  Patient presents with   Follow-up    B20     HPI:  DELBERT Waters is a 49 y.o. female with HIV disease who was diagnosed in December 2018 with initial CD4 nadir of 1080 and viral load of 472 and risk factor including heterosexual contact who was last seen in the office on 02/15/2020 with good adherence and tolerance to her ART regimen of Biktarvy.  Viral load at the time was found to be undetectable with CD4 count of 824.  RPR titer was positive at 1: 32 and was treated for syphilis.  Here today for routine follow-up.  Michelle Waters continues to take her Biktarvy daily as prescribed with no adverse side effects or missed doses since her last office visit.  Overall feeling well today and has gained 9 pounds since her last office visit. Denies fevers, chills, night sweats, headaches, changes in vision, neck pain/stiffness, nausea, diarrhea, vomiting, lesions or rashes.  Michelle Waters has no problems obtaining her medication from the pharmacy and has renewed her financial assistance.  Denies feelings of being down, depressed, or hopeless recently.  No recreational or illicit drug use.  Alcohol consumption is social and is a current everyday smoker of 1/2 pack of cigarettes per day on average.  Sexually active with her husband in a monogamous relationship.  Has an interview at [employer] coming up.   Allergies  Allergen Reactions   Ampicillin Swelling and Rash   Penicillins Swelling and Rash   Shrimp [Shellfish Allergy] Swelling   Codeine Itching   Hydrocodone Itching      Outpatient Medications Prior to Visit  Medication Sig Dispense Refill   acetaminophen (TYLENOL) 500 MG tablet Take 1,000 mg by mouth every 6 (six) hours as needed for moderate pain.      albuterol (PROVENTIL HFA;VENTOLIN HFA) 108 (90 Base) MCG/ACT inhaler Inhale 2 puffs into the lungs every 6 (six) hours  as needed for wheezing or shortness of breath. 54 g 3   diclofenac Sodium (VOLTAREN) 1 % GEL Apply 2 g topically 4 (four) times daily. 100 g 0   omeprazole (PRILOSEC) 20 MG capsule TAKE ONE CAPSULE BY MOUTH EVERY DAY BEFORE BREAKFAST(WAIT 30 MINUTES BEFORE EATING) 30 capsule 4   sertraline (ZOLOFT) 50 MG tablet TAKE 1/2 TABLET BY MOUTH DAILY FOR 2 WEEKS, THEN TAKE 1 TABLET DAILY 90 tablet 1   bictegravir-emtricitabine-tenofovir AF (BIKTARVY) 50-200-25 MG TABS tablet TAKE 1 TABLET BY MOUTH DAILY AT THE SAME TIME EACH DAY WITH OR WITHOUT FOOD 30 tablet 5   No facility-administered medications prior to visit.     Past Medical History:  Diagnosis Date   Acid reflux    Elevated serum creatinine 10/05/2017   HIV (human immunodeficiency virus infection) ([place])    Hypertension      Past Surgical History:  Procedure Laterality Date   I & D EXTREMITY Right 08/30/2017   Procedure: IRRIGATION AND DEBRIDEMENT INDEX FINGER;  Surgeon: Netta Cedars, MD;  Location: [facility];  Service: Orthopedics;  Laterality: Right;       Review of Systems  Constitutional: Negative for appetite change, chills, diaphoresis, fatigue, fever and unexpected weight change.  Eyes:       Negative for acute change in vision  Respiratory: Negative for chest tightness, shortness of breath and wheezing.   Cardiovascular: Negative for chest pain.  Gastrointestinal:  Negative for diarrhea, nausea and vomiting.  Genitourinary: Negative for dysuria, pelvic pain and vaginal discharge.  Musculoskeletal: Negative for neck pain and neck stiffness.  Skin: Negative for rash.  Neurological: Negative for seizures, syncope, weakness and headaches.  Hematological: Negative for adenopathy. Does not bruise/bleed easily.  Psychiatric/Behavioral: Negative for hallucinations.      Objective:    BP (!) 141/89    Pulse (!) 103    Temp 98.2 F (36.8 C) (Oral)    Wt 125 lb (56.7 kg)    BMI 22.86 kg/m  Nursing note and vital signs  reviewed.  Physical Exam Constitutional:      General: She is not in acute distress.    Appearance: She is well-developed.  Eyes:     Conjunctiva/sclera: Conjunctivae normal.  Cardiovascular:     Rate and Rhythm: Normal rate and regular rhythm.     Heart sounds: Normal heart sounds. No murmur heard.  No friction rub. No gallop.   Pulmonary:     Effort: Pulmonary effort is normal. No respiratory distress.     Breath sounds: Normal breath sounds. No wheezing or rales.  Chest:     Chest wall: No tenderness.  Abdominal:     General: Bowel sounds are normal.     Palpations: Abdomen is soft.     Tenderness: There is no abdominal tenderness.  Musculoskeletal:     Cervical back: Neck supple.  Lymphadenopathy:     Cervical: No cervical adenopathy.  Skin:    General: Skin is warm and dry.     Findings: No rash.  Neurological:     Mental Status: She is alert and oriented to person, place, and time.  Psychiatric:        Behavior: Behavior normal.        Thought Content: Thought content normal.        Judgment: Judgment normal.      Depression screen [facility] 2/9 04/02/2020 11/11/2019 04/16/2018 12/29/2017 10/05/2017  Decreased Interest 0 1 0 0 0  Down, Depressed, Hopeless 0 1 0 0 2  PHQ - 2 Score 0 2 0 0 2  Altered sleeping - 1 - - 0  Tired, decreased energy - 0 - - 1  Change in appetite - 1 - - 1  Feeling bad or failure about yourself  - 1 - - 1  Trouble concentrating - 1 - - 2  Moving slowly or fidgety/restless - 0 - - 0  Suicidal thoughts - 0 - - 0  PHQ-9 Score - 6 - - 7  Difficult doing work/chores - - - - Somewhat difficult       Assessment & Plan:    Patient Active Problem List   Diagnosis Date Noted   Low grade squamous intraepithelial lesion on cytologic smear of cervix (LGSIL) 04/02/2020   Right foot pain 02/15/2020   Early syphilis, 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 Toepfer continues to have well-controlled HIV disease with good adherence and tolerance to her ART regimen of Biktarvy.  No signs/symptoms of opportunistic infection or progressive HIV disease.  We reviewed previous lab work and discussed the plan of care including Covid  vaccine booster.  Financial assistance has been renewed.  Continue current dose of Biktarvy.  Check blood work today.  Plan for follow-up in 4 months or sooner if needed with lab work on the same day.      Relevant Medications   bictegravir-emtricitabine-tenofovir AF (BIKTARVY) 50-200-25 MG TABS tablet   Other Relevant Orders   HIV-1 RNA quant-no reflex-bld   COMPLETE METABOLIC PANEL WITH GFR   Waters-helper cell (CD4)- ([facility] clinic only)   Healthcare maintenance     Covid vaccine up-to-date per recommendations.  Discussed booster dose available, however with current immune system may wait or receive booster dose if she would like.  Discussed importance of safe sexual practice to reduce risk of STIs.  Condoms declined.  Due for colon cancer screening through colonoscopy  We will update tetanus at next office visit.      Early syphilis, latent - Primary    Ms. Haglund was previously treated with RPR titer of 1: 32.  Currently without symptoms.  Recheck RPR today.  Continue to monitor.      Relevant Medications   bictegravir-emtricitabine-tenofovir AF (BIKTARVY) 50-200-25 MG TABS tablet   Other Relevant Orders   RPR       I am having Raynell L. Wileman maintain her albuterol, acetaminophen, diclofenac Sodium, omeprazole, sertraline, and Biktarvy.   Meds ordered this encounter  Medications   bictegravir-emtricitabine-tenofovir AF (BIKTARVY) 50-200-25 MG TABS tablet    Sig: TAKE 1 TABLET BY MOUTH  DAILY AT THE SAME TIME EACH DAY WITH OR WITHOUT FOOD    Dispense:  30 tablet    Refill:  5    Order Specific Question:   Supervising Provider    Answer:   Carlyle Basques [[id number]]     Follow-up: Return in about 4 months (around 10/08/2020), or if symptoms worsen or fail to improve.   Terri Piedra, MSN, FNP-C Nurse Practitioner [facility] [place] number: [phone]

## 2020-06-08 NOTE — Patient Instructions (Addendum)
Nice to see you.  Continue to take your [place] daily as prescribed.  We will get refills to the pharmacy.  Plan for follow up in 4 months or sooner if needed.   Have a great day and stay safe!

## 2020-06-11 LAB — HIV-1 RNA QUANT-NO REFLEX-BLD
HIV 1 RNA Quant: <20 Copies/mL
HIV-1 RNA Quant, Log: <1.3 Log cps/mL

## 2020-06-11 LAB — COMPLETE METABOLIC PANEL WITH GFR
AG Ratio: 1.6 (calc) (ref 1.0–2.5)
ALT: 19 U/L (ref 6–29)
AST: 33 U/L (ref 10–35)
Albumin: 4.4 g/dL (ref 3.6–5.1)
Alkaline phosphatase (APISO): 107 U/L (ref 37–153)
BUN: 9 mg/dL (ref 7–25)
CO2: 26 mmol/L (ref 20–32)
Calcium: 9.7 mg/dL (ref 8.6–10.4)
Chloride: 104 mmol/L (ref 98–110)
Creat: 0.84 mg/dL (ref 0.50–1.05)
GFR, Est African American: 93 mL/min/{1.73_m2} (ref >=60)
GFR, Est Non African American: 80 mL/min/{1.73_m2} (ref >=60)
Globulin: 2.7 g/dL (calc) (ref 1.9–3.7)
Glucose, Bld: 83 mg/dL (ref 65–99)
Potassium: 3.6 mmol/L (ref 3.5–5.3)
Sodium: 138 mmol/L (ref 135–146)
Total Bilirubin: 0.4 mg/dL (ref 0.2–1.2)
Total Protein: 7.1 g/dL (ref 6.1–8.1)

## 2020-06-11 LAB — FLUORESCENT TREPONEMAL AB(FTA)-IGG-BLD: Fluorescent Treponemal ABS: REACTIVE — AB

## 2020-06-11 LAB — RPR: RPR Ser Ql: REACTIVE — AB

## 2020-06-11 LAB — RPR TITER: RPR Titer: 1:2 {titer} — ABNORMAL HIGH

## 2020-06-18 ENCOUNTER — Other Ambulatory Visit: Payer: Self-pay | Admitting: Family

## 2020-06-18 DIAGNOSIS — K219 Gastro-esophageal reflux disease without esophagitis: Secondary | ICD-10-CM

## 2020-06-21 ENCOUNTER — Encounter: Payer: Self-pay | Admitting: Physician Assistant

## 2020-06-22 ENCOUNTER — Encounter: Payer: Self-pay | Admitting: Family Medicine

## 2020-06-27 ENCOUNTER — Ambulatory Visit (HOSPITAL_BASED_OUTPATIENT_CLINIC_OR_DEPARTMENT_OTHER): Payer: Self-pay | Admitting: Pharmacist

## 2020-06-27 ENCOUNTER — Encounter: Payer: Self-pay | Admitting: Family Medicine

## 2020-06-27 ENCOUNTER — Ambulatory Visit: Payer: Self-pay | Attending: Physician Assistant | Admitting: Family Medicine

## 2020-06-27 ENCOUNTER — Telehealth: Payer: Self-pay

## 2020-06-27 ENCOUNTER — Other Ambulatory Visit: Payer: Self-pay

## 2020-06-27 VITALS — BP 135/86 | HR 75 | Temp 97.9°F | Resp 16 | Wt 121.0 lb

## 2020-06-27 DIAGNOSIS — Z01419 Encounter for gynecological examination (general) (routine) without abnormal findings: Secondary | ICD-10-CM

## 2020-06-27 DIAGNOSIS — R87612 Low grade squamous intraepithelial lesion on cytologic smear of cervix (LGSIL): Secondary | ICD-10-CM

## 2020-06-27 DIAGNOSIS — Z131 Encounter for screening for diabetes mellitus: Secondary | ICD-10-CM

## 2020-06-27 DIAGNOSIS — Z23 Encounter for immunization: Secondary | ICD-10-CM

## 2020-06-27 DIAGNOSIS — Z1211 Encounter for screening for malignant neoplasm of colon: Secondary | ICD-10-CM

## 2020-06-27 DIAGNOSIS — Z124 Encounter for screening for malignant neoplasm of cervix: Secondary | ICD-10-CM

## 2020-06-27 DIAGNOSIS — N898 Other specified noninflammatory disorders of vagina: Secondary | ICD-10-CM

## 2020-06-27 DIAGNOSIS — R350 Frequency of micturition: Secondary | ICD-10-CM

## 2020-06-27 DIAGNOSIS — Z8742 Personal history of other diseases of the female genital tract: Secondary | ICD-10-CM

## 2020-06-27 DIAGNOSIS — Z13220 Encounter for screening for lipoid disorders: Secondary | ICD-10-CM

## 2020-06-27 DIAGNOSIS — Z1231 Encounter for screening mammogram for malignant neoplasm of breast: Secondary | ICD-10-CM

## 2020-06-27 LAB — POCT URINALYSIS DIP (CLINITEK)
Bilirubin, UA: NEGATIVE
Blood, UA: NEGATIVE
Glucose, UA: NEGATIVE mg/dL
Ketones, POC UA: NEGATIVE mg/dL
Nitrite, UA: NEGATIVE
Spec Grav, UA: 1.025
Urobilinogen, UA: 0.2 U/dL
pH, UA: 6

## 2020-06-27 NOTE — Progress Notes (Signed)
Here for a physical

## 2020-06-27 NOTE — Telephone Encounter (Signed)
Received call from [employer] case manager S. Ross requesting updated prescription for ensure. Ensure no longer listed on patient medication profile. Routing to [place] for ensure approval to be added back to medication profile. Printed prescription can be faxed to [facility] Attn: Mrs. Harrington Challenger at [facility]

## 2020-06-27 NOTE — Progress Notes (Signed)
Patient presents for vaccination against tetanus per orders of Dr. Wynetta Emery. Consent given. Counseling provided. No contraindications exists. Vaccine administered without incident. Will coordinate for patient assistance.   Benard Halsted, PharmD, [place] [phone]

## 2020-06-27 NOTE — Patient Instructions (Signed)
Health Maintenance, Female Adopting a healthy lifestyle and getting preventive care are important in promoting health and wellness. Ask your health care provider about:  The right schedule for you to have regular tests and exams.  Things you can do on your own to prevent diseases and keep yourself healthy. What should I know about diet, weight, and exercise? Eat a healthy diet   Eat a diet that includes plenty of vegetables, fruits, low-fat dairy products, and lean protein.  Do not eat a lot of foods that are high in solid fats, added sugars, or sodium. Maintain a healthy weight Body mass index (BMI) is used to identify weight problems. It estimates body fat based on height and weight. Your health care provider can help determine your BMI and help you achieve or maintain a healthy weight. Get regular exercise Get regular exercise. This is one of the most important things you can do for your health. Most adults should:  Exercise for at least 150 minutes each week. The exercise should increase your heart rate and make you sweat (moderate-intensity exercise).  Do strengthening exercises at least twice a week. This is in addition to the moderate-intensity exercise.  Spend less time sitting. Even light physical activity can be beneficial. Watch cholesterol and blood lipids Have your blood tested for lipids and cholesterol at 51 years of age, then have this test every 5 years. Have your cholesterol levels checked more often if:  Your lipid or cholesterol levels are high.  You are older than 51 years of age.  You are at high risk for heart disease. What should I know about cancer screening? Depending on your health history and family history, you may need to have cancer screening at various ages. This may include screening for:  Breast cancer.  Cervical cancer.  Colorectal cancer.  Skin cancer.  Lung cancer. What should I know about heart disease, diabetes, and high blood  pressure? Blood pressure and heart disease  High blood pressure causes heart disease and increases the risk of stroke. This is more likely to develop in people who have high blood pressure readings, are of African descent, or are overweight.  Have your blood pressure checked: ? Every 3-5 years if you are 18-39 years of age. ? Every year if you are 40 years old or older. Diabetes Have regular diabetes screenings. This checks your fasting blood sugar level. Have the screening done:  Once every three years after age 40 if you are at a normal weight and have a low risk for diabetes.  More often and at a younger age if you are overweight or have a high risk for diabetes. What should I know about preventing infection? Hepatitis B If you have a higher risk for hepatitis B, you should be screened for this virus. Talk with your health care provider to find out if you are at risk for hepatitis B infection. Hepatitis C Testing is recommended for:  Everyone born from 1945 through 1965.  Anyone with known risk factors for hepatitis C. Sexually transmitted infections (STIs)  Get screened for STIs, including gonorrhea and chlamydia, if: ? You are sexually active and are younger than 51 years of age. ? You are older than 51 years of age and your health care provider tells you that you are at risk for this type of infection. ? Your sexual activity has changed since you were last screened, and you are at increased risk for chlamydia or gonorrhea. Ask your health care provider if   you are at risk.  Ask your health care provider about whether you are at high risk for HIV. Your health care provider may recommend a prescription medicine to help prevent HIV infection. If you choose to take medicine to prevent HIV, you should first get tested for HIV. You should then be tested every 3 months for as long as you are taking the medicine. Pregnancy  If you are about to stop having your period (premenopausal) and  you may become pregnant, seek counseling before you get pregnant.  Take 400 to 800 micrograms (mcg) of folic acid every day if you become pregnant.  Ask for birth control (contraception) if you want to prevent pregnancy. Osteoporosis and menopause Osteoporosis is a disease in which the bones lose minerals and strength with aging. This can result in bone fractures. If you are 65 years old or older, or if you are at risk for osteoporosis and fractures, ask your health care provider if you should:  Be screened for bone loss.  Take a calcium or vitamin D supplement to lower your risk of fractures.  Be given hormone replacement therapy (HRT) to treat symptoms of menopause. Follow these instructions at home: Lifestyle  Do not use any products that contain nicotine or tobacco, such as cigarettes, e-cigarettes, and chewing tobacco. If you need help quitting, ask your health care provider.  Do not use street drugs.  Do not share needles.  Ask your health care provider for help if you need support or information about quitting drugs. Alcohol use  Do not drink alcohol if: ? Your health care provider tells you not to drink. ? You are pregnant, may be pregnant, or are planning to become pregnant.  If you drink alcohol: ? Limit how much you use to 0-1 drink a day. ? Limit intake if you are breastfeeding.  Be aware of how much alcohol is in your drink. In the [place] one drink equals one 12 oz bottle of beer (355 mL), one 5 oz glass of wine (148 mL), or one 1 oz glass of hard liquor (44 mL). General instructions  Schedule regular health, dental, and eye exams.  Stay current with your vaccines.  Tell your health care provider if: ? You often feel depressed. ? You have ever been abused or do not feel safe at home. Summary  Adopting a healthy lifestyle and getting preventive care are important in promoting health and wellness.  Follow your health care provider's instructions about healthy  diet, exercising, and getting tested or screened for diseases.  Follow your health care provider's instructions on monitoring your cholesterol and blood pressure. This information is not intended to replace advice given to you by your health care provider. Make sure you discuss any questions you have with your health care provider. Document Revised: 09/29/2018 Document Reviewed: 09/29/2018 Elsevier Patient Education  2020 [employer]  

## 2020-06-27 NOTE — Progress Notes (Signed)
Established Patient Office Visit  Subjective:  Patient ID: Michelle Waters, female    DOB: [birth year]  Age: 49 y.o. MRN: [id number]  CC: See nurses note; annual exam   HPI Michelle Waters presents for annual well exam. History of abnormal pap with LGSIL and HPV in 2019 and patient did not keep f/u with GYN.  She reports spotting x1 yesterday otherwise has not had a period in the past 12 months. No abdominal or pelvic pain. She has noticed a recent increase in urinary frequency. No dysuria. No increased thirst or blurred vision. She is non-fasting- ate a muffin prior to her visit.   She continues to follow-up with infectious disease and states that she normally receives her annual influenza immunization at that office.  Past Medical History:  Diagnosis Date  . Acid reflux   . Elevated serum creatinine 10/05/2017  . HIV (human immunodeficiency virus infection) ([place])   . Hypertension     Past Surgical History:  Procedure Laterality Date  . I & D EXTREMITY Right 08/30/2017   Procedure: IRRIGATION AND DEBRIDEMENT INDEX FINGER;  Surgeon: Netta Cedars, MD;  Location: [facility];  Service: Orthopedics;  Laterality: Right;    Family History  Problem Relation Age of Onset  . Hypertension Mother   . Diabetes Mother   . Lung cancer Maternal Grandmother   . Lung cancer Maternal Uncle     Social History   Socioeconomic History  . Marital status: Married    Spouse name: Not on file  . Number of children: Not on file  . Years of education: Not on file  . Highest education level: Not on file  Occupational History  . Not on file  Tobacco Use  . Smoking status: Current Every Day Smoker    Packs/day: 0.50  . Smokeless tobacco: Never Used  Vaping Use  . Vaping Use: Never used  Substance and Sexual Activity  . Alcohol use: Yes  . Drug use: No  . Sexual activity: Yes    Partners: Male    Birth control/protection: Condom    Comment: offered condoms  Other Topics Concern  . Not on file    Social History Narrative  . Not on file   Social Determinants of Health   Financial Resource Strain:   . Difficulty of Paying Living Expenses: Not on file  Food Insecurity:   . Worried About Charity fundraiser in the Last Year: Not on file  . Ran Out of Food in the Last Year: Not on file  Transportation Needs:   . Lack of Transportation (Medical): Not on file  . Lack of Transportation (Non-Medical): Not on file  Physical Activity:   . Days of Exercise per Week: Not on file  . Minutes of Exercise per Session: Not on file  Stress:   . Feeling of Stress : Not on file  Social Connections:   . Frequency of Communication with Friends and Family: Not on file  . Frequency of Social Gatherings with Friends and Family: Not on file  . Attends Religious Services: Not on file  . Active Member of Clubs or Organizations: Not on file  . Attends Archivist Meetings: Not on file  . Marital Status: Not on file  Intimate Partner Violence:   . Fear of Current or Ex-Partner: Not on file  . Emotionally Abused: Not on file  . Physically Abused: Not on file  . Sexually Abused: Not on file    Outpatient Medications Prior  to Visit  Medication Sig Dispense Refill  . acetaminophen (TYLENOL) 500 MG tablet Take 1,000 mg by mouth every 6 (six) hours as needed for moderate pain.     Marland Kitchen albuterol (PROVENTIL HFA;VENTOLIN HFA) 108 (90 Base) MCG/ACT inhaler Inhale 2 puffs into the lungs every 6 (six) hours as needed for wheezing or shortness of breath. 54 g 3  . bictegravir-emtricitabine-tenofovir AF (BIKTARVY) 50-200-25 MG TABS tablet TAKE 1 TABLET BY MOUTH DAILY AT THE SAME TIME EACH DAY WITH OR WITHOUT FOOD 30 tablet 5  . diclofenac Sodium (VOLTAREN) 1 % GEL Apply 2 g topically 4 (four) times daily. 100 g 0  . omeprazole (PRILOSEC) 20 MG capsule TAKE ONE CAPSULE BY MOUTH EVERY DAY BEFORE BREAKFAST(WAIT 30 MINUTES BEFORE EATING) 30 capsule 4  . sertraline (ZOLOFT) 50 MG tablet TAKE 1/2 TABLET BY  MOUTH DAILY FOR 2 WEEKS, THEN TAKE 1 TABLET DAILY 90 tablet 1   No facility-administered medications prior to visit.    Allergies  Allergen Reactions  . Ampicillin Swelling and Rash  . Penicillins Swelling and Rash  . Shrimp [Shellfish Allergy] Swelling  . Codeine Itching  . Hydrocodone Itching    ROS Review of Systems  Constitutional: Negative for chills and fever.  HENT: Negative for sore throat and trouble swallowing.   Respiratory: Negative for cough and shortness of breath.   Cardiovascular: Negative for chest pain and palpitations.  Gastrointestinal: Negative for abdominal pain, blood in stool, constipation, diarrhea and nausea.  Endocrine: Negative for polydipsia, polyphagia and polyuria.  Genitourinary: Positive for frequency. Negative for dysuria and pelvic pain.  Musculoskeletal: Negative for arthralgias and back pain.  Skin: Negative for rash and wound.  Neurological: Negative for dizziness and headaches.  Hematological: Negative for adenopathy. Does not bruise/bleed easily.  Psychiatric/Behavioral: Negative for suicidal ideas. The patient is not nervous/anxious.       Objective:    Physical Exam Vitals and nursing note reviewed. Exam conducted with a chaperone present.  Constitutional:      General: She is not in acute distress.    Appearance: Normal appearance. She is not ill-appearing.     Comments: WNWD small framed African-American female in NAD  Neck:     Vascular: No carotid bruit.  Cardiovascular:     Rate and Rhythm: Normal rate and regular rhythm.  Pulmonary:     Effort: Pulmonary effort is normal.     Breath sounds: Normal breath sounds.  Abdominal:     Palpations: Abdomen is soft.     Tenderness: There is no abdominal tenderness. There is no right CVA tenderness, left CVA tenderness, guarding or rebound.     Comments: Complaint of increased urge to urinate/pressure with palpation over the suprapubic area but no tenderness/pain  Genitourinary:     Vagina: Vaginal discharge present.     Comments: Presence of white discharge in the vaginal canal; normal appearance to the cervix; no CMT and no adnexal tenderness; small area of bleeding at approx 3 o'clock position after collection of cells for pap Musculoskeletal:        General: No tenderness.     Cervical back: Normal range of motion and neck supple.     Right lower leg: No edema.     Left lower leg: No edema.  Lymphadenopathy:     Cervical: No cervical adenopathy.  Skin:    General: Skin is warm and dry.  Neurological:     General: No focal deficit present.     Mental Status: She  is alert and oriented to person, place, and time.  Psychiatric:        Mood and Affect: Mood normal.        Behavior: Behavior normal.     BP 135/86   Pulse 75   Temp 97.9 F (36.6 C)   Resp 16   Wt 121 lb (54.9 kg)   SpO2 100%   BMI 22.13 kg/m  Wt Readings from Last 3 Encounters:  06/27/20 121 lb (54.9 kg)  06/08/20 125 lb (56.7 kg)  04/02/20 119 lb 9.6 oz (54.3 kg)     Health Maintenance Due  Topic Date Due  . TETANUS/TDAP  Never done  . MAMMOGRAM  Never done  . COLONOSCOPY  Never done  . INFLUENZA VACCINE  05/20/2020   She reports that she will receive her influenza immunization at Infectious Disease at her next appointment. She agreed to have Tdap at today's visit. She agreed to have referrals placed for mammogram and colonoscopy at today's visit.    No results found for: TSH Lab Results  Component Value Date   WBC 5.8 01/26/2020   HGB 13.7 01/26/2020   HCT 40.6 01/26/2020   MCV 90.2 01/26/2020   PLT 291 01/26/2020   Lab Results  Component Value Date   NA 138 06/08/2020   K 3.6 06/08/2020   CO2 26 06/08/2020   GLUCOSE 83 06/08/2020   BUN 9 06/08/2020   CREATININE 0.84 06/08/2020   BILITOT 0.4 06/08/2020   ALKPHOS 98 03/16/2018   AST 33 06/08/2020   ALT 19 06/08/2020   PROT 7.1 06/08/2020   ALBUMIN 3.4 (L) 03/16/2018   CALCIUM 9.7 06/08/2020   ANIONGAP 8  03/16/2018   Lab Results  Component Value Date   CHOL 224 (H) 01/26/2020   Lab Results  Component Value Date   HDL 83 01/26/2020   Lab Results  Component Value Date   LDLCALC 117 (H) 01/26/2020   Lab Results  Component Value Date   TRIG 127 01/26/2020   Lab Results  Component Value Date   CHOLHDL 2.7 01/26/2020   Lab Results  Component Value Date   HGBA1C 5.8 (H) 01/17/2015      Assessment & Plan:  1. Well female exam with routine gynecological exam Patient with well female exam done at today's visit.  Educational material on preventative health care provided.  She agreed to have referral for her mammogram and colonoscopy.  Pap smear was also done at today's visit.  She is encouraged to continue healthy diet and regular low impact exercise such as walking for cardiovascular health.  She reports that she will have her influenza immunization at ID.  2. 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  She will have repeat Pap smear at today's visit and will be notified regarding the results and if gynecology referral is needed based on the results. - Cytology - PAP([place])  3. Screening for colon cancer Preventative health measures discussed and patient agrees to have referral for screening colonoscopy placed at today's visit. - Ambulatory referral to Gastroenterology  4. Encounter for screening mammogram for malignant neoplasm of breast Information provided on health maintenance.  Preventative care measures discussed and she agrees to have order placed for screening mammogram. - MM Digital Screening; Future  5. History of abnormal cervical Pap  smear 6. Screening for cervical cancer Patient with prior Pap smear showing LGSIL and positive  for HPV for which she was previously referred to gynecology but missed the appointment.  She will have repeat Pap smear done at today's visit and will be notified of the results and if referral to gynecology for further evaluation is warranted based on these results. - Cytology - PAP([place])  7. Urinary frequency She expresses recent onset of urinary frequency.  Will obtain urinalysis and urine culture.  Patient with presence of vaginal discharge on vaginal exam which may cause leukocytes to be in the urine.  She will additionally have hemoglobin A1c to look for possible diabetes as a cause of her urinary frequency is on review of chart, patient with prior abnormal hemoglobin A1c of 5.8 in 2016. - POCT URINALYSIS DIP ([facility]) - Urine Culture - Hgb A1c; Future  8. Screening for diabetes mellitus (DM) She has been asked to return for fasting BMP as a screening test for DM and due to long term use of medications. On review of chart, she appears to have had a prior Hgb A1c of 5.8 in 2016. Will also place order for repeat A1c as she has had some urinary frequency.  - Basic metabolic panel; Future  9. Screening for lipid disorders Patient is non-fasting and has been asked to return for fasting screening lipid panel. - Lipid panel; Future  10. Need for prophylactic vaccination against diphtheria and tetanus Tdao vaccination discussed which patient agreed to have done and was administered by pharmacy student at today's visit- see immunization note.   11. Vaginal discharge Patient with presence of vaginal discharge on pelvic exam and cervicovaginal ancillary testing preformed and she will be notified of the results and if further treatment is needed based on the results.  - Cervicovaginal ancillary only     Follow-up: Return in about 2 weeks (around 07/11/2020) for return for fasting labs; keep scheduled f/u with PCP.    Antony Blackbird, MD

## 2020-06-28 ENCOUNTER — Other Ambulatory Visit: Payer: Self-pay | Admitting: Family Medicine

## 2020-06-28 ENCOUNTER — Other Ambulatory Visit: Payer: Self-pay | Admitting: Obstetrics and Gynecology

## 2020-06-28 DIAGNOSIS — Z1231 Encounter for screening mammogram for malignant neoplasm of breast: Secondary | ICD-10-CM

## 2020-06-28 LAB — CERVICOVAGINAL ANCILLARY ONLY
Bacterial Vaginitis (gardnerella): POSITIVE — AB
Candida Glabrata: POSITIVE — AB
Candida Vaginitis: NEGATIVE
Chlamydia: NEGATIVE
Comment: NEGATIVE
Comment: NEGATIVE
Comment: NEGATIVE
Comment: NEGATIVE
Comment: NEGATIVE
Comment: NORMAL
Neisseria Gonorrhea: NEGATIVE
Trichomonas: NEGATIVE

## 2020-06-29 ENCOUNTER — Other Ambulatory Visit: Payer: Self-pay | Admitting: Family Medicine

## 2020-06-29 DIAGNOSIS — B3731 Acute candidiasis of vulva and vagina: Secondary | ICD-10-CM

## 2020-06-29 DIAGNOSIS — B9689 Other specified bacterial agents as the cause of diseases classified elsewhere: Secondary | ICD-10-CM

## 2020-06-29 LAB — URINE CULTURE: Organism ID, Bacteria: NO GROWTH

## 2020-06-29 MED ORDER — FLUCONAZOLE 150 MG PO TABS: 150.0000 mg | ORAL_TABLET | Freq: Once | ORAL | 0 refills | Status: AC

## 2020-06-29 MED ORDER — METRONIDAZOLE 500 MG PO TABS: 500.0000 mg | ORAL_TABLET | Freq: Two times a day (BID) | ORAL | 0 refills | Status: AC

## 2020-07-02 ENCOUNTER — Other Ambulatory Visit: Payer: Self-pay | Admitting: Family Medicine

## 2020-07-02 DIAGNOSIS — R87612 Low grade squamous intraepithelial lesion on cytologic smear of cervix (LGSIL): Secondary | ICD-10-CM

## 2020-07-02 LAB — CYTOLOGY - PAP
Chlamydia: NEGATIVE
Comment: NEGATIVE
Comment: NEGATIVE
Comment: NORMAL
High risk HPV: POSITIVE — AB
Neisseria Gonorrhea: NEGATIVE

## 2020-07-05 ENCOUNTER — Other Ambulatory Visit: Payer: Self-pay

## 2020-07-05 MED ORDER — ENSURE PO LIQD: 237.0000 mL | Freq: Two times a day (BID) | ORAL | 6 refills | Status: DC

## 2020-07-05 NOTE — Progress Notes (Signed)
Ok to add back to medication profile per Terri Piedra.  Michelle Waters

## 2020-07-05 NOTE — Telephone Encounter (Signed)
Ok to refill non-narcotic medications. If she is needing a refill of pain medication will need to complete a drug test as I have filled several months of medication without and this is a requirement by the Medical Board for chronic pain medication.

## 2020-07-16 ENCOUNTER — Telehealth: Payer: Self-pay | Admitting: Internal Medicine

## 2020-07-16 NOTE — Telephone Encounter (Signed)
Please advise.   Copied from [facility] [phone]. Topic: General - Other >> Jul 16, 2020  4:21 PM Leward Quan A wrote: Reason for CRM: Patient called to get an update on the referral for the pap smear that she need also would like Dr Chapman Fitch to know that she could not take the antibiotics that were prescribed for her because it contained aspirin and acetaminophen that messes with her stomach. And also interacts with her  bictegravir-emtricitabine-tenofovir AF (BIKTARVY) 50-200-25 MG TABS tablet. Please call patient with referral update at  Ph# [phone]

## 2020-07-16 NOTE — Telephone Encounter (Signed)
Routed message to Maren Reamer and gave patient phone number to [facility] - [phone].

## 2020-07-18 ENCOUNTER — Encounter: Payer: Self-pay | Admitting: Gastroenterology

## 2020-07-18 NOTE — Telephone Encounter (Signed)
Patient is scheduled  for  10/28@11 :15am  and she is aware   .  I spoke to Ball and she said the process is when an uninsured patient has a abnormal pap need to sent an in basket  message to Nino Glow and she will contact the patient to schedule an appointment with [facility] and Center for [facility]  will see her .

## 2020-07-19 ENCOUNTER — Other Ambulatory Visit: Payer: Self-pay

## 2020-07-19 DIAGNOSIS — Z1231 Encounter for screening mammogram for malignant neoplasm of breast: Secondary | ICD-10-CM

## 2020-08-08 ENCOUNTER — Telehealth: Payer: Self-pay | Admitting: Internal Medicine

## 2020-08-08 NOTE — Telephone Encounter (Signed)
°   Michelle Waters DOB: 1971/05/27 MRN: [id number]   RIDER WAIVER AND RELEASE OF LIABILITY  For purposes of improving physical access to our facilities, [place] [facility] is pleased to partner with third parties to provide [facility] patients or other authorized individuals the option of convenient, on-demand ground transportation services (the [employer]) through use of the technology service that enables users to request on-demand ground transportation from independent third-party providers.  By opting to use and accept these [employer], I, the undersigned, hereby agree on behalf of myself, and on behalf of any minor child using the [employer] for whom I am the parent or legal guardian, as follows:  1. Government social research officer provided to me are provided by independent third-party transportation providers who are not [employer] or employees and who are unaffiliated with [employer]. 2. [facility] is neither a transportation carrier nor a common or public carrier. 3. [facility] has no control over the quality or safety of the transportation that occurs as a result of the [employer]. 4. [facility] cannot guarantee that any third-party transportation provider will complete any arranged transportation service. 5. [facility] makes no representation, warranty, or guarantee regarding the reliability, timeliness, quality, safety, suitability, or availability of any of the Transport Services or that they will be error free. 6. I fully understand that traveling by vehicle involves risks and dangers of serious bodily injury, including permanent disability, paralysis, and death. I agree, on behalf of myself and on behalf of any minor child using the Transport Services for whom I am the parent or legal guardian, that the entire risk arising out of my use of the [employer] remains solely with me, to the maximum extent permitted under applicable law. 7. The [employer] are provided as is and as available. [facility] disclaims all representations and warranties, express, implied or statutory, not expressly set out in these terms, including the implied warranties of merchantability and fitness for a particular purpose. 8. I hereby waive and release [place], its agents, employees, officers, directors, representatives, insurers, attorneys, assigns, successors, subsidiaries, and affiliates from any and all past, present, or future claims, demands, liabilities, actions, causes of action, or suits of any kind directly or indirectly arising from acceptance and use of the [employer]. 9. I further waive and release  and its affiliates from all present and future liability and responsibility for any injury or death to persons or damages to property caused by or related to the use of the [employer]. 10. I have read this Waiver and Release of Liability, and I understand the terms used in it and their legal significance. This Waiver is freely and voluntarily given with the understanding that my right (as well as the right of any minor child for whom I am the parent or legal guardian using the [employer]) to legal recourse against  in connection with the [employer] is knowingly surrendered in return for use of these services.   I attest that I read the consent document to Michelle Waters, gave Ms. Tadros the opportunity to ask questions and answered the questions asked (if any). I affirm that Michelle Waters then provided consent for she's participation in this program.     Legrand Pitts

## 2020-08-16 ENCOUNTER — Other Ambulatory Visit: Payer: Self-pay

## 2020-08-16 ENCOUNTER — Ambulatory Visit
Admission: RE | Admit: 2020-08-16 | Discharge: 2020-08-16 | Disposition: A | Discharge status: Home / Self Care | Payer: Self-pay | Source: Ambulatory Visit | Attending: Internal Medicine | Admitting: Internal Medicine

## 2020-08-16 ENCOUNTER — Ambulatory Visit: Payer: Self-pay | Admitting: *Deleted

## 2020-08-16 VITALS — BP 122/88 | Temp 97.1°F | Wt 123.9 lb

## 2020-08-16 DIAGNOSIS — Z1239 Encounter for other screening for malignant neoplasm of breast: Secondary | ICD-10-CM

## 2020-08-16 DIAGNOSIS — R87612 Low grade squamous intraepithelial lesion on cytologic smear of cervix (LGSIL): Secondary | ICD-10-CM

## 2020-08-16 NOTE — Progress Notes (Addendum)
Ms. KAZANDRA FORSTROM is a 49 y.o. female who presents to [facility] clinic today with no complaints. Patient referred to [facility] by [facility] due to having an abnormal Pap smear on 06/27/2020 that a colposcopy is recommended for follow-up.    Pap Smear: Pap not smear completed today. Last Pap smear was 06/27/2020 at [facility] clinic and was LSIL with positive HPV. Patient has a history of an abnormal Pap smear 04/16/2018 that was LSIL with no follow-up completed. Last Pap smear result is available in Epic.   Physical exam: Breasts Breasts symmetrical. No skin abnormalities bilateral breasts. No nipple retraction bilateral breasts. No nipple discharge bilateral breasts. No lymphadenopathy. No lumps palpated bilateral breasts. No complaints of pain or tenderness on exam.       Pelvic/Bimanual Pap is not indicated today per BCCCP guidelines.   Smoking History: Patient is a current smoker. Discussed smoking cessation with patient. Referred to the [facility] Quitline and gave resources to the free smoking cessation classes at [facility].   Patient Navigation: Patient education provided. Access to services provided for patient through [facility] program. Transportation provided to [facility] and colposcopy appointments and back home following appointments.  Colorectal Cancer Screening: Per patient has never had colonoscopy completed. Patient is scheduled for a colonoscopy 09/12/2020 at [facility]. No complaints today.    Breast and Cervical Cancer Risk Assessment: Patient does not have family history of breast cancer, known genetic mutations, or radiation treatment to the chest before age 50. Patient does not have history of cervical dysplasia. Patient has history of HIV. Patient has no history of DES exposure in-utero.  Risk Assessment    Risk Scores      08/16/2020   Last edited by: Royston Bake, CMA   5-year risk: 0.9 %   Lifetime risk: 6.6 %           A: BCCCP exam without pap smear Patient referred to [facility] by [facility] and Wellness due to having an abnormal Pap smear on 06/27/2020 that a colposcopy is recommended for follow-up.   P: Referred patient to the [facility] for a screening mammogram on the mobile unit. Appointment scheduled Thursday, August 16, 2020 at 1150.  Referred patient the [facility] for [place] for a colposcopy to follow-up for her abnormal Pap smear. Appointment scheduled Thursday, August 23, 2020 at 1455.  Loletta Parish, RN 08/16/2020 12:24 PM

## 2020-08-16 NOTE — Patient Instructions (Addendum)
Explained breast self awareness with [facility]. Patient did not need a Pap smear today due to last Pap smear was 06/27/2020. Explained the colposcopy the recommended follow-up for her abnormal Pap smear. Referred patient the [facility] for [place] for a colposcopy to follow-up for her abnormal Pap smear. Appointment scheduled Thursday, August 23, 2020 at 1455. Referred patient to the [facility] for a screening mammogram on the mobile unit. Appointment scheduled Thursday, August 16, 2020 at 1150. Patient escorted to the mobile unit following BCCCP appointment for her screening mammogram. Let patient know the [facility] will follow up with her within the next couple weeks with results of her mammogram by letter or phone. Discussed smoking cessation with patient. Referred to the [facility] Quitline and gave resources to the free smoking cessation classes at [facility]. Vaughn verbalized understanding.  Willer Osorno, Arvil Chaco, RN 12:25 PM

## 2020-08-21 ENCOUNTER — Other Ambulatory Visit: Payer: Self-pay | Admitting: Obstetrics and Gynecology

## 2020-08-21 DIAGNOSIS — R928 Other abnormal and inconclusive findings on diagnostic imaging of breast: Secondary | ICD-10-CM

## 2020-08-23 ENCOUNTER — Ambulatory Visit: Payer: Self-pay | Admitting: Obstetrics and Gynecology

## 2020-09-12 ENCOUNTER — Encounter: Payer: Self-pay | Admitting: Gastroenterology

## 2020-09-17 ENCOUNTER — Telehealth: Payer: Self-pay | Admitting: Gastroenterology

## 2020-09-17 NOTE — Telephone Encounter (Signed)
Patient called needing to reschedule her procedure 09/24/2020 due to not having insurance said she will call back once she gets medicare

## 2020-09-17 NOTE — Telephone Encounter (Signed)
Okay thanks for letting me know

## 2020-09-18 ENCOUNTER — Other Ambulatory Visit: Payer: Self-pay | Admitting: Family

## 2020-09-18 DIAGNOSIS — B20 Human immunodeficiency virus [HIV] disease: Secondary | ICD-10-CM

## 2020-09-20 ENCOUNTER — Ambulatory Visit (INDEPENDENT_AMBULATORY_CARE_PROVIDER_SITE_OTHER): Payer: No Typology Code available for payment source | Admitting: Obstetrics and Gynecology

## 2020-09-20 ENCOUNTER — Other Ambulatory Visit (HOSPITAL_COMMUNITY)
Admission: RE | Admit: 2020-09-20 | Discharge: 2020-09-20 | Disposition: A | Discharge status: Home / Self Care | Payer: No Typology Code available for payment source | Source: Ambulatory Visit | Attending: Obstetrics and Gynecology | Admitting: Obstetrics and Gynecology

## 2020-09-20 ENCOUNTER — Encounter: Payer: Self-pay | Admitting: Obstetrics and Gynecology

## 2020-09-20 ENCOUNTER — Other Ambulatory Visit: Payer: Self-pay

## 2020-09-20 VITALS — BP 114/77 | HR 75 | Wt 125.4 lb

## 2020-09-20 DIAGNOSIS — Z3202 Encounter for pregnancy test, result negative: Secondary | ICD-10-CM

## 2020-09-20 DIAGNOSIS — N879 Dysplasia of cervix uteri, unspecified: Secondary | ICD-10-CM | POA: Insufficient documentation

## 2020-09-20 HISTORY — PX: COLPOSCOPY: PRO47

## 2020-09-20 LAB — POCT PREGNANCY, URINE: Preg Test, Ur: NEGATIVE

## 2020-09-20 NOTE — Procedures (Signed)
Colposcopy Procedure Note  Pre-operative Diagnosis: 06/2020: LSIL, HPV+. 2019: LSIL, HPV+. 2015: cytology neg and hpv neg  Post-operative Diagnosis: cin 1  Procedure Details  LMP: none (post menopausal).   The risks (including infection, bleeding, pain) and benefits of the procedure were explained to the patient and written informed consent was obtained.  The patient was placed in the dorsal lithotomy position. A Pederson was speculum inserted in the vagina, and the cervix was visualized.  AA staining done Lugol's with green filter.  Biopsy 6 o'clock and then single toothed tenaculum applied and ECC in all four quadrants done. No bleeding after procedure  Findings: small AWE changes at 6 o'clock.  Adequate: No  Specimens: 6 o'clock and endocervical curettage  Condition: Stable  Complications: None  Plan: The patient was advised to call for any fever or for prolonged or severe pain or bleeding. She was advised to use OTC analgesics as needed for mild to moderate pain. She was advised to avoid vaginal intercourse for 48 hours or until the bleeding has completely stopped.  Recommend d/c tobacco use and importance of close follow up d/w pt.    Durene Romans MD Attending Center for [employer] Fish farm manager)

## 2020-09-21 ENCOUNTER — Other Ambulatory Visit: Payer: No Typology Code available for payment source

## 2020-09-24 ENCOUNTER — Encounter: Payer: Self-pay | Admitting: Gastroenterology

## 2020-09-24 LAB — SURGICAL PATHOLOGY

## 2020-09-28 ENCOUNTER — Encounter: Payer: Self-pay | Admitting: Obstetrics and Gynecology

## 2020-09-28 ENCOUNTER — Telehealth: Payer: Self-pay | Admitting: Obstetrics and Gynecology

## 2020-09-28 DIAGNOSIS — N879 Dysplasia of cervix uteri, unspecified: Secondary | ICD-10-CM | POA: Insufficient documentation

## 2020-09-28 NOTE — Telephone Encounter (Signed)
GYN Telephone Note Patient called and I told her that she will just need to repeat her pap and hpv testing in one year and to continue with yearly paps for the foreseeable future. Patient amenable to plan  Durene Romans MD Attending Center for [facility]) 09/28/2020 Time: [phone]

## 2020-10-03 ENCOUNTER — Other Ambulatory Visit: Payer: Self-pay

## 2020-10-03 DIAGNOSIS — K219 Gastro-esophageal reflux disease without esophagitis: Secondary | ICD-10-CM

## 2020-10-03 MED ORDER — OMEPRAZOLE 20 MG PO CPDR: DELAYED_RELEASE_CAPSULE | ORAL | 4 refills | Status: DC

## 2020-10-08 ENCOUNTER — Telehealth: Payer: Self-pay

## 2020-10-08 NOTE — Telephone Encounter (Signed)
Patient called in regards to getting her omeprazole filled. I spoke with [facility] and patient has had 13 refills for the year which is the max amount of refills for her omeprazole under [facility]. Patient informed of information.  Patient advised that [facility] said they do have a co pay card that will bring the omeprazole down to $28 or patient can purchase it over the counter. Patient states she does not have the money at this time and she would discuss this with her family members to see if they would help her or she will wait to January to get refills  Corley Maffeo T Brooks Sailors

## 2020-10-15 ENCOUNTER — Ambulatory Visit: Payer: Self-pay | Admitting: Family

## 2020-10-16 ENCOUNTER — Other Ambulatory Visit: Payer: Self-pay

## 2020-10-16 ENCOUNTER — Encounter: Payer: Self-pay | Admitting: Family

## 2020-10-16 ENCOUNTER — Ambulatory Visit (INDEPENDENT_AMBULATORY_CARE_PROVIDER_SITE_OTHER): Payer: Self-pay | Admitting: Family

## 2020-10-16 VITALS — BP 149/101 | HR 70 | Temp 98.2°F | Ht 59.0 in | Wt 126.0 lb

## 2020-10-16 DIAGNOSIS — B20 Human immunodeficiency virus [HIV] disease: Secondary | ICD-10-CM

## 2020-10-16 DIAGNOSIS — Z23 Encounter for immunization: Secondary | ICD-10-CM

## 2020-10-16 DIAGNOSIS — K219 Gastro-esophageal reflux disease without esophagitis: Secondary | ICD-10-CM | POA: Insufficient documentation

## 2020-10-16 DIAGNOSIS — Z0000 Encounter for general adult medical examination without abnormal findings: Secondary | ICD-10-CM

## 2020-10-16 DIAGNOSIS — Z113 Encounter for screening for infections with a predominantly sexual mode of transmission: Secondary | ICD-10-CM

## 2020-10-16 MED ORDER — BIKTARVY 50-200-25 MG PO TABS: ORAL_TABLET | ORAL | 5 refills | Status: DC

## 2020-10-16 MED ORDER — OMEPRAZOLE 20 MG PO CPDR: DELAYED_RELEASE_CAPSULE | ORAL | 4 refills | Status: DC

## 2020-10-16 NOTE — Assessment & Plan Note (Signed)
   Influenza vaccine updated today.  Discussed importance of safe sexual practice to reduce risk of STI.  Condoms declined.  Due for routine dental care with information provided in AVS.  Due for colonoscopy for colon cancer screening.

## 2020-10-16 NOTE — Addendum Note (Signed)
Addended by: Jeanine Luz D on: 10/16/2020 01:39 PM   Modules accepted: Level of Service

## 2020-10-16 NOTE — Progress Notes (Addendum)
Subjective:    Patient ID: Michelle Waters, female    DOB: August 18, 1971, 49 y.o.   MRN: [id number]  Chief Complaint  Patient presents with  . Follow-up    Declined condoms      HPI:  Michelle Waters is a 49 y.o. female is HIV disease last seen on 06/08/2020 with good adherence and tolerance to her ART regimen of Biktarvy.  Viral load at the time was undetectable with CD4 count of 899.  Here today for routine follow-up.  Michelle Waters continues to take her Biktarvy daily as prescribed with no adverse side effects or missed doses since her last office visit.  Overall feeling well today with acute onset back pain for last several days. Denies fevers, chills, night sweats, headaches, changes in vision, neck pain/stiffness, nausea, diarrhea, vomiting, lesions or rashes.  Michelle Waters has no problems obtaining her medication from the pharmacy and remains covered through [employer].  Denies feelings of being down, depressed, or hopeless recently.  No recreational or illicit drug use and continues to smoke approximately 1/2 pack of cigarettes per day on average and drinks alcohol.  Declines condoms. Due for routine dental care.   Allergies  Allergen Reactions  . Ampicillin Swelling and Rash  . Penicillins Swelling and Rash  . Shrimp [Shellfish Allergy] Swelling  . Codeine Itching  . Hydrocodone Itching      Outpatient Medications Prior to Visit  Medication Sig Dispense Refill  . acetaminophen (TYLENOL) 500 MG tablet Take 1,000 mg by mouth every 6 (six) hours as needed for moderate pain.     Marland Kitchen albuterol (PROVENTIL HFA;VENTOLIN HFA) 108 (90 Base) MCG/ACT inhaler Inhale 2 puffs into the lungs every 6 (six) hours as needed for wheezing or shortness of breath. 54 g 3  . diclofenac Sodium (VOLTAREN) 1 % GEL Apply 2 g topically 4 (four) times daily. 100 g 0  . Ensure (ENSURE) Take 237 mLs by mouth 2 (two) times daily between meals. 237 mL 6  . sertraline (ZOLOFT) 50 MG tablet TAKE 1/2 TABLET BY MOUTH DAILY  FOR 2 WEEKS, THEN TAKE 1 TABLET DAILY 90 tablet 1  . BIKTARVY 50-200-25 MG TABS tablet TAKE 1 TABLET BY MOUTH DAILY AT THE SAME TIME EACH DAY WITH OR WITHOUT FOOD 30 tablet 0  . omeprazole (PRILOSEC) 20 MG capsule Take one capsule by mouth every day before breakfast ( wait 30 minutes  before eating). 30 capsule 4   No facility-administered medications prior to visit.     Past Medical History:  Diagnosis Date  . Acid reflux   . Elevated serum creatinine 10/05/2017  . HIV (human immunodeficiency virus infection) (HCC)   . Hypertension      Past Surgical History:  Procedure Laterality Date  . COLPOSCOPY  09/20/2020      . I & D EXTREMITY Right 08/30/2017   Procedure: IRRIGATION AND DEBRIDEMENT INDEX FINGER;  Surgeon: Beverely Low, MD;  Location: [facility] OR;  Service: Orthopedics;  Laterality: Right;       Review of Systems  Constitutional: Negative for appetite change, chills, diaphoresis, fatigue, fever and unexpected weight change.  Eyes:       Negative for acute change in vision  Respiratory: Negative for chest tightness, shortness of breath and wheezing.   Cardiovascular: Negative for chest pain.  Gastrointestinal: Negative for diarrhea, nausea and vomiting.  Genitourinary: Negative for dysuria, pelvic pain and vaginal discharge.  Musculoskeletal: Negative for neck pain and neck stiffness.  Skin: Negative for rash.  Neurological: Negative for seizures, syncope, weakness and headaches.  Hematological: Negative for adenopathy. Does not bruise/bleed easily.  Psychiatric/Behavioral: Negative for hallucinations.      Objective:    BP (!) 149/101   Pulse 70   Temp 98.2 F (36.8 C) (Oral)   Ht 4\' 11"  (1.499 m)   Wt 126 lb (57.2 kg)   SpO2 100%   BMI 25.45 kg/m  Nursing note and vital signs reviewed.  Physical Exam Constitutional:      General: She is not in acute distress.    Appearance: She is well-developed.  HENT:     Mouth/Throat:     Mouth: Oropharynx is clear  and moist.  Eyes:     Conjunctiva/sclera: Conjunctivae normal.  Cardiovascular:     Rate and Rhythm: Normal rate and regular rhythm.     Pulses: Intact distal pulses.     Heart sounds: Normal heart sounds. No murmur heard. No friction rub. No gallop.   Pulmonary:     Effort: Pulmonary effort is normal. No respiratory distress.     Breath sounds: Normal breath sounds. No wheezing or rales.  Chest:     Chest wall: No tenderness.  Abdominal:     General: Bowel sounds are normal.     Palpations: Abdomen is soft.     Tenderness: There is no abdominal tenderness.  Musculoskeletal:     Cervical back: Neck supple.  Lymphadenopathy:     Cervical: No cervical adenopathy.  Skin:    General: Skin is warm and dry.     Findings: No rash.  Neurological:     Mental Status: She is alert and oriented to person, place, and time.  Psychiatric:        Mood and Affect: Mood and affect normal.        Behavior: Behavior normal.        Thought Content: Thought content normal.        Judgment: Judgment normal.      Depression screen [facility] 2/9 10/16/2020 09/20/2020 06/27/2020 04/02/2020 11/11/2019  Decreased Interest 1 1 1  0 1  Down, Depressed, Hopeless 1 1 0 0 1  PHQ - 2 Score 2 2 1  0 2  Altered sleeping 0 0 1 - 1  Tired, decreased energy 1 1 0 - 0  Change in appetite 0 1 0 - 1  Feeling bad or failure about yourself  1 1 1  - 1  Trouble concentrating 2 1 1  - 1  Moving slowly or fidgety/restless 2 1 0 - 0  Suicidal thoughts 0 0 0 - 0  PHQ-9 Score 8 7 4  - 6  Difficult doing work/chores Very difficult - - - -       Assessment & Plan:    Patient Active Problem List   Diagnosis Date Noted  . Gastroesophageal reflux disease 10/16/2020  . Cervical dysplasia 09/28/2020  . Low grade squamous intraepithelial lesion on cytologic smear of cervix (LGSIL) 04/02/2020  . Right foot pain 02/15/2020  . 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 Mild depression (HCC) 11/11/2019  . Mild protein-energy malnutrition  (HCC) 11/09/2019  . HIV disease (HCC) 10/05/2017  . Healthcare maintenance 10/05/2017  . Tobacco dependence 01/17/2015  . Family history of diabetes mellitus (DM) 01/17/2015     Problem List Items Addressed This Visit      Digestive   Gastroesophageal reflux disease    Tends to have increased acid reflux with taking Biktarvy which is well controlled with current dose of omeprazole.  Continue omeprazole and dietary adjustments as needed.      Relevant Medications   omeprazole (PRILOSEC) 20 MG capsule     Other   HIV disease ([place]) - Primary    Michelle Waters appears to have continued well-controlled HIV disease with good adherence and tolerance to her ART regimen of Biktarvy.  No signs/symptoms of opportunistic infection or progressive HIV disease.  We reviewed previous lab work and discussed plan of care.  Check blood work today.  Continue current dose of Biktarvy.  Plan for follow-up in 4 months or sooner if needed with lab work on the same day.      Relevant Medications   bictegravir-emtricitabine-tenofovir AF (BIKTARVY) 50-200-25 MG TABS tablet   Other Relevant Orders   COMPLETE METABOLIC PANEL WITH GFR   HIV-1 RNA quant-no reflex-bld   T-helper cell (CD4)- ([facility] clinic only)   Healthcare maintenance     Influenza vaccine updated today.  Discussed importance of safe sexual practice to reduce risk of STI.  Condoms declined.  Due for routine dental care with information provided in AVS.  Due for colonoscopy for colon cancer screening.       Other Visit Diagnoses    Screening for STDs (sexually transmitted diseases)       Relevant Orders   RPR   Need for immunization against influenza       Relevant Orders   Flu Vaccine QUAD 36+ mos IM (Completed)       I have changed Michelle Waters's Biktarvy. I am also having her maintain her albuterol, acetaminophen, diclofenac Sodium, sertraline, Ensure, and omeprazole.   Meds ordered this encounter  Medications  .  bictegravir-emtricitabine-tenofovir AF (BIKTARVY) 50-200-25 MG TABS tablet    Sig: Take 1 tablet by mouth daily    Dispense:  30 tablet    Refill:  5    Order Specific Question:   Supervising Provider    Answer:   Carlyle Basques [[id number]]  . omeprazole (PRILOSEC) 20 MG capsule    Sig: Take one capsule by mouth every day before breakfast ( wait 30 minutes  before eating).    Dispense:  30 capsule    Refill:  4    Order Specific Question:   Supervising Provider    Answer:   Carlyle Basques [[id number]]     Follow-up: Return in about 4 months (around 02/14/2021), or if symptoms worsen or fail to improve.   Terri Piedra, MSN, FNP-C Nurse Practitioner [facility] [place] number: [phone]

## 2020-10-16 NOTE — Assessment & Plan Note (Signed)
[name] appears to have continued well-controlled HIV disease with good adherence and tolerance to her ART regimen of Biktarvy.  No signs/symptoms of opportunistic infection or progressive HIV disease.  We reviewed previous lab work and discussed plan of care.  Check blood work today.  Continue current dose of Biktarvy.  Plan for follow-up in 4 months or sooner if needed with lab work on the same day.

## 2020-10-16 NOTE — Assessment & Plan Note (Signed)
Tends to have increased acid reflux with taking Biktarvy which is well controlled with current dose of omeprazole.  Continue omeprazole and dietary adjustments as needed.

## 2020-10-16 NOTE — Patient Instructions (Signed)
Nice to see you.  We will check your lab work today.  Continue to take your [name] daily as prescribed.  Refills have been sent to the pharmacy.  Plan for follow up in 4 months or sooner if needed.  Have a great day and be safe!  Happy New Year!!!

## 2020-10-17 LAB — T-HELPER CELL (CD4) - (RCID CLINIC ONLY)
CD4 % Helper T Cell: 53 % (ref 33–65)
CD4 T Cell Abs: 1109 /uL (ref 400–1790)

## 2020-10-26 ENCOUNTER — Other Ambulatory Visit: Payer: Self-pay

## 2020-10-26 ENCOUNTER — Ambulatory Visit (INDEPENDENT_AMBULATORY_CARE_PROVIDER_SITE_OTHER): Payer: Self-pay

## 2020-10-26 DIAGNOSIS — Z23 Encounter for immunization: Secondary | ICD-10-CM

## 2020-10-26 NOTE — Progress Notes (Signed)
   Covid-19 Vaccination Clinic  Name:  Michelle Waters    MRN: [id number] DOB: [birth year]  10/26/2020  Michelle Waters was observed post Covid-19 immunization for 15 minutes without incident. She was provided with Vaccine Information Sheet and instruction to access the V-Safe system.   Michelle Waters was instructed to call 911 with any severe reactions post vaccine: Marland Kitchen Difficulty breathing  . Swelling of face and throat  . A fast heartbeat  . A bad rash all over body  . Dizziness and weakness   Immunizations Administered    Name Date Dose VIS Date Route   Pfizer COVID-19 Vaccine 10/26/2020 11:15 AM 0.3 mL 08/08/2020 Intramuscular   Manufacturer: [place]   Lot: [id number]   NDC: [place]

## 2020-10-28 LAB — RPR: RPR Ser Ql: REACTIVE — AB

## 2020-10-28 LAB — COMPLETE METABOLIC PANEL WITH GFR
AG Ratio: 1.5 (calc) (ref 1.0–2.5)
ALT: 17 U/L (ref 6–29)
AST: 37 U/L — ABNORMAL HIGH (ref 10–35)
Albumin: 4 g/dL (ref 3.6–5.1)
Alkaline phosphatase (APISO): 110 U/L (ref 37–153)
BUN: 18 mg/dL (ref 7–25)
CO2: 28 mmol/L (ref 20–32)
Calcium: 9.6 mg/dL (ref 8.6–10.4)
Chloride: 107 mmol/L (ref 98–110)
Creat: 0.86 mg/dL (ref 0.50–1.05)
GFR, Est African American: 91 mL/min/{1.73_m2} (ref >=60)
GFR, Est Non African American: 78 mL/min/{1.73_m2} (ref >=60)
Globulin: 2.7 g/dL (calc) (ref 1.9–3.7)
Glucose, Bld: 93 mg/dL (ref 65–99)
Potassium: 4.4 mmol/L (ref 3.5–5.3)
Sodium: 139 mmol/L (ref 135–146)
Total Bilirubin: 0.2 mg/dL (ref 0.2–1.2)
Total Protein: 6.7 g/dL (ref 6.1–8.1)

## 2020-10-28 LAB — HIV-1 RNA QUANT-NO REFLEX-BLD
HIV 1 RNA Quant: <20 Copies/mL
HIV-1 RNA Quant, Log: <1.3 Log cps/mL

## 2020-10-28 LAB — FLUORESCENT TREPONEMAL AB(FTA)-IGG-BLD: Fluorescent Treponemal ABS: REACTIVE — AB

## 2020-10-28 LAB — RPR TITER: RPR Titer: 1:2 {titer} — ABNORMAL HIGH

## 2020-10-29 ENCOUNTER — Telehealth: Payer: Self-pay

## 2020-10-29 NOTE — Telephone Encounter (Signed)
Called patient to relay that per Terri Piedra, NP her viral load is undetectable, CD4 count is 1109, kidney & liver function and electrolytes look good, and that syphilis titer remains steady with no need for treatment. Patient verbalized understanding and has no further questions.   Beryle Flock, RN

## 2020-10-29 NOTE — Telephone Encounter (Signed)
-----   Message from [place], [place] sent at 10/29/2020 10:28 AM EST ----- Please inform Michelle Waters that her viral load is undetectable and CD4 count is 1,109. Kidney function, liver function, and electrolytes look good. Syphilis titer remains steady with no infection or need for treatment.

## 2020-11-02 ENCOUNTER — Other Ambulatory Visit: Payer: No Typology Code available for payment source

## 2020-11-13 ENCOUNTER — Other Ambulatory Visit: Payer: No Typology Code available for payment source

## 2020-12-12 ENCOUNTER — Other Ambulatory Visit: Payer: Self-pay

## 2020-12-12 ENCOUNTER — Encounter: Payer: Self-pay | Admitting: Family

## 2020-12-12 MED ORDER — ENSURE PO LIQD: 237.0000 mL | Freq: Two times a day (BID) | ORAL | 6 refills | Status: DC

## 2020-12-12 NOTE — Telephone Encounter (Signed)
New script faxed to [facility].  [name]

## 2021-01-20 IMAGING — MG DIGITAL SCREENING BILAT W/ TOMO W/ CAD
8 series · 8 of 24 positions shown · non-contrast
Comparison: None.

CLINICAL DATA: Screening.

EXAM:
DIGITAL SCREENING BILATERAL MAMMOGRAM WITH TOMO AND CAD

[L CC synth-2D]
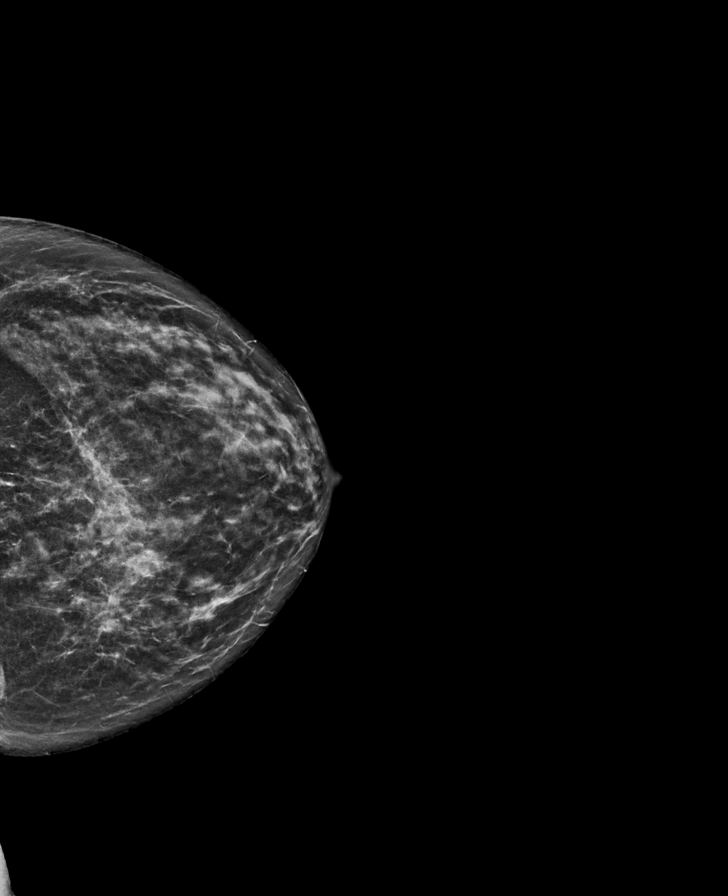

[R CC synth-2D]
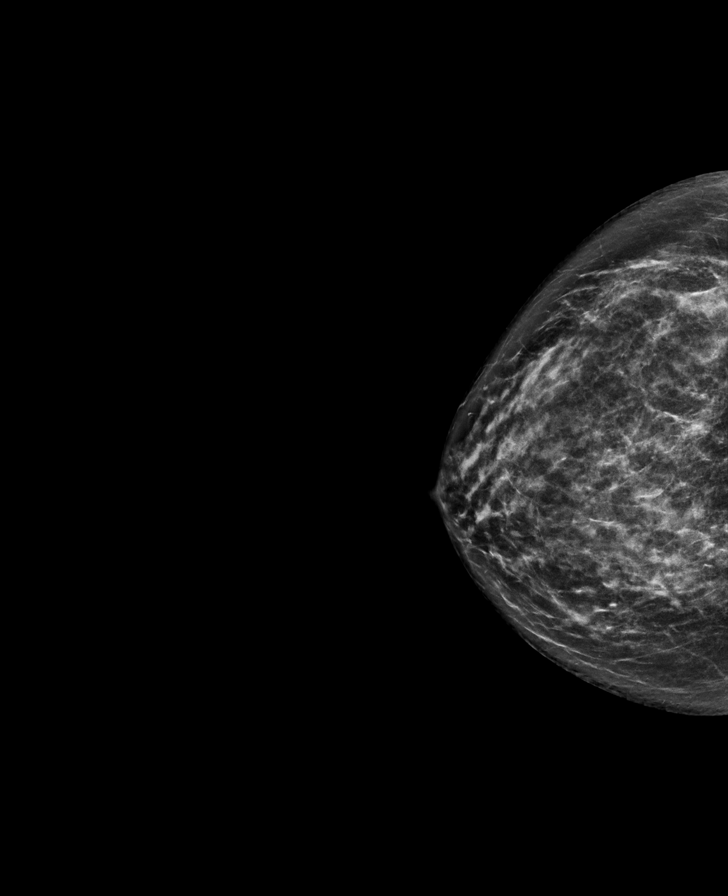

[R MLO synth-2D]
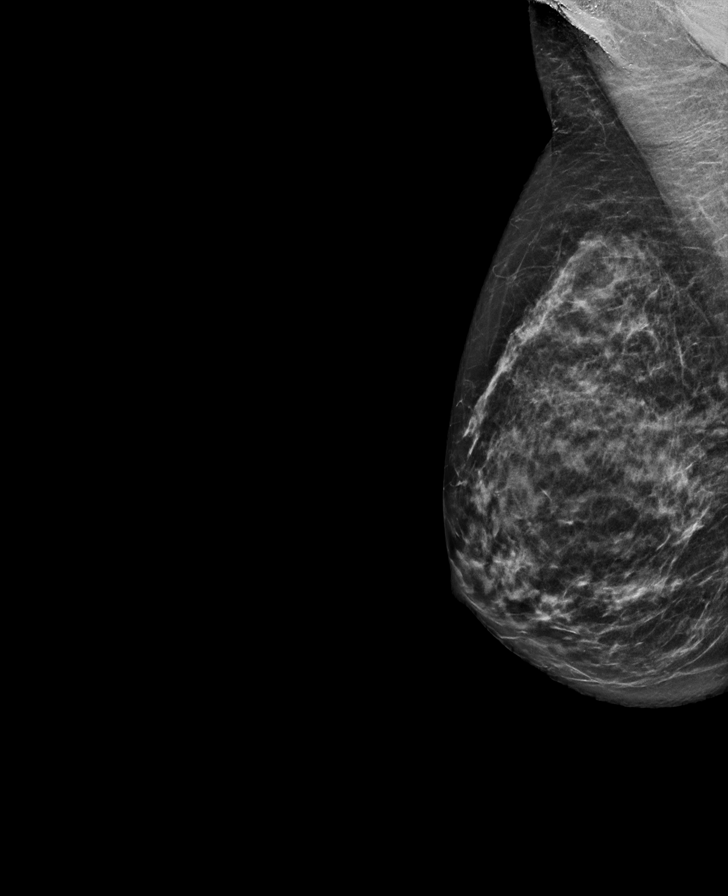

[L MLO synth-2D]
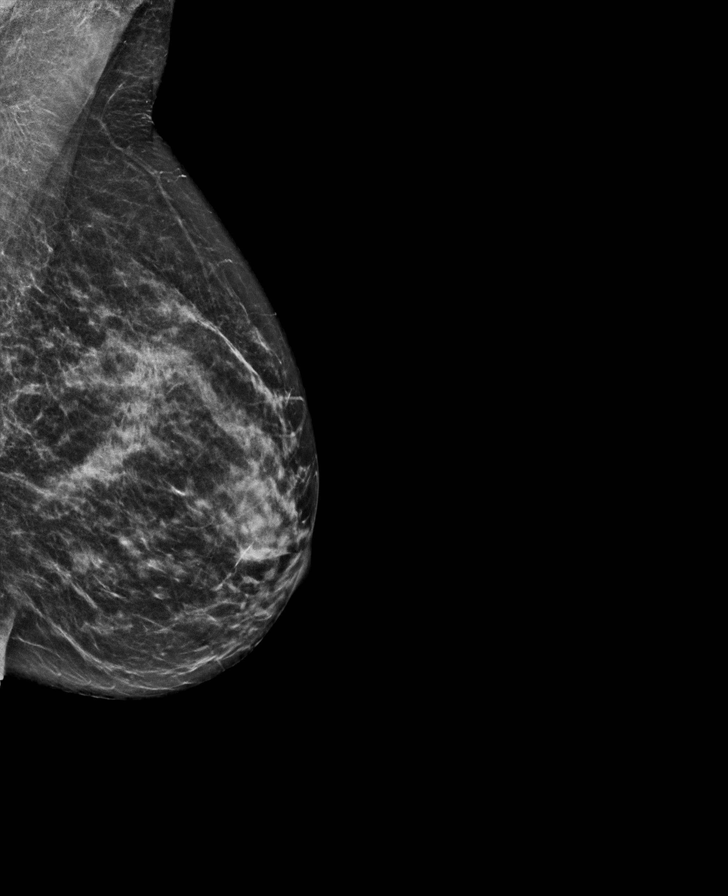

[L CC tomo · tomo slice 29/57.0]
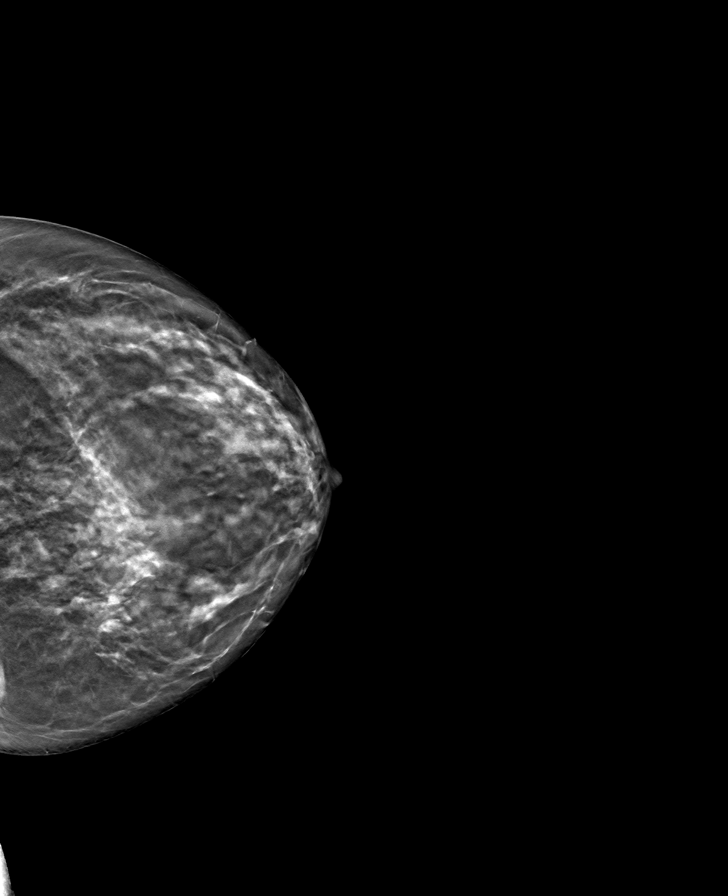

[L MLO tomo · tomo slice 28/55.0]
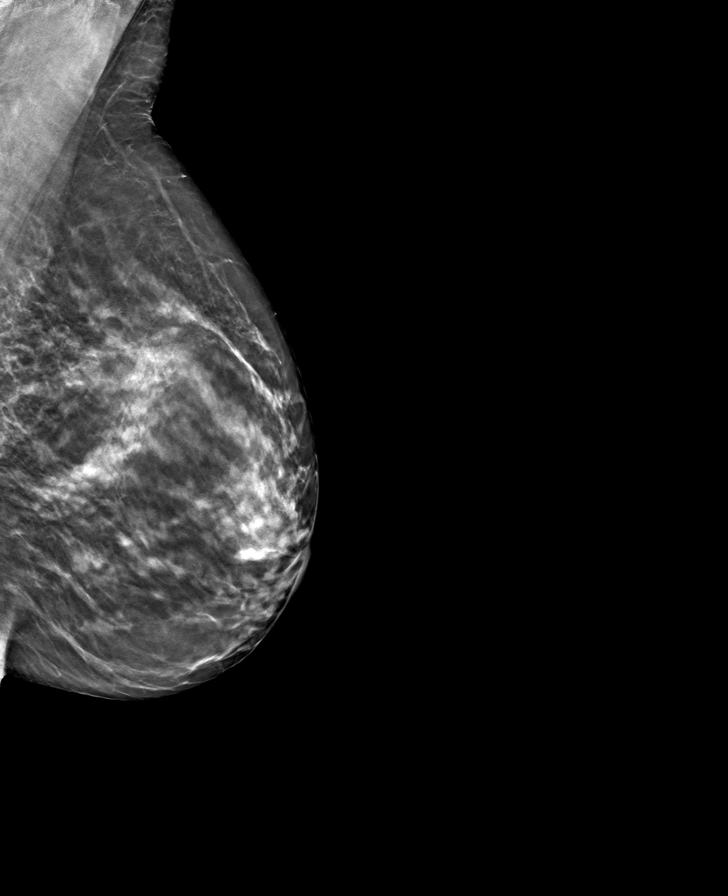

[R MLO tomo · tomo slice 29/57.0]
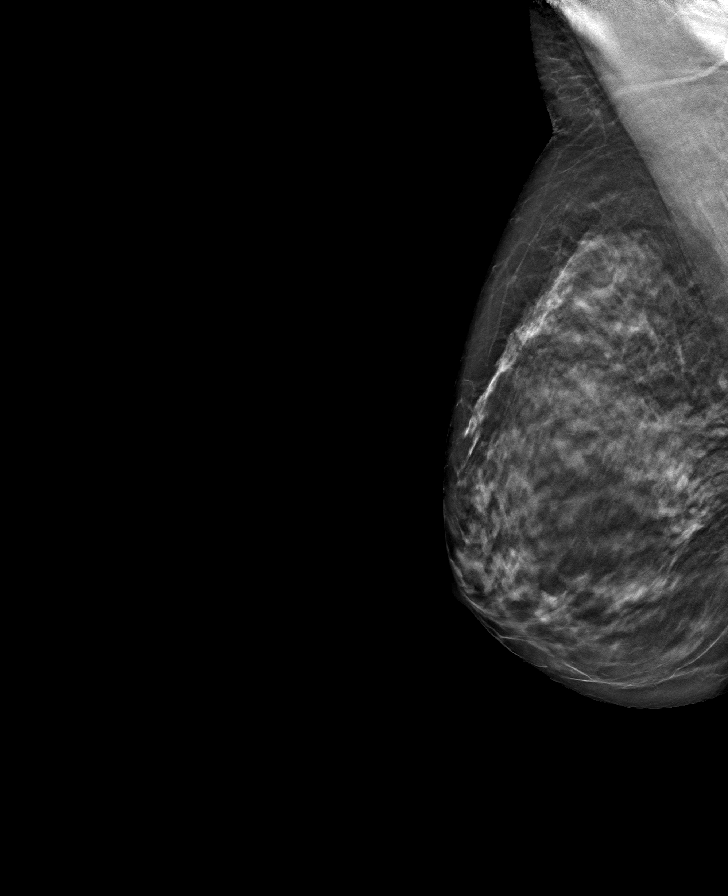

[R CC tomo · tomo slice 31/60.0]
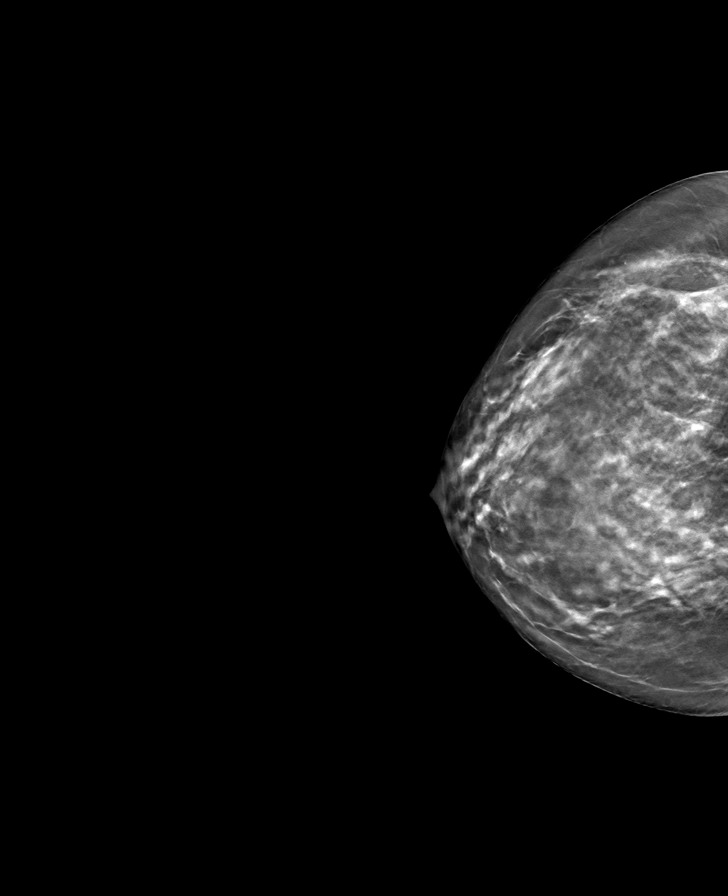

[8 of 24 positions shown; findings below may reference images not displayed]

ACR Breast Density Category c: The breast tissue is heterogeneously
dense, which may obscure small masses.
FINDINGS: In the left breast, a possible asymmetry warrants further
evaluation. In the right breast, no findings suspicious for
malignancy. Images were processed with CAD.
IMPRESSION: Further evaluation is suggested for possible asymmetry in the left
breast.

RECOMMENDATION:
Diagnostic mammogram and possibly ultrasound of the left breast.
(Code:[id number])

The patient will be contacted regarding the findings, and additional
imaging will be scheduled.

BI-RADS CATEGORY  0: Incomplete. Need additional imaging evaluation
and/or prior mammograms for comparison.

## 2021-02-05 ENCOUNTER — Other Ambulatory Visit: Payer: Self-pay

## 2021-02-05 DIAGNOSIS — B20 Human immunodeficiency virus [HIV] disease: Secondary | ICD-10-CM

## 2021-02-05 DIAGNOSIS — Z113 Encounter for screening for infections with a predominantly sexual mode of transmission: Secondary | ICD-10-CM

## 2021-02-05 DIAGNOSIS — Z79899 Other long term (current) drug therapy: Secondary | ICD-10-CM

## 2021-02-06 ENCOUNTER — Other Ambulatory Visit: Payer: Self-pay

## 2021-02-06 DIAGNOSIS — Z79899 Other long term (current) drug therapy: Secondary | ICD-10-CM

## 2021-02-06 DIAGNOSIS — B20 Human immunodeficiency virus [HIV] disease: Secondary | ICD-10-CM

## 2021-02-06 DIAGNOSIS — Z113 Encounter for screening for infections with a predominantly sexual mode of transmission: Secondary | ICD-10-CM

## 2021-02-07 LAB — T-HELPER CELL (CD4) - (RCID CLINIC ONLY)
CD4 % Helper T Cell: 58 % (ref 33–65)
CD4 T Cell Abs: 1269 /uL (ref 400–1790)

## 2021-02-07 LAB — URINE CYTOLOGY ANCILLARY ONLY
Chlamydia: NEGATIVE
Comment: NEGATIVE
Comment: NORMAL
Neisseria Gonorrhea: NEGATIVE

## 2021-02-08 LAB — LIPID PANEL
Cholesterol: 229 mg/dL — ABNORMAL HIGH (ref <200)
HDL: 73 mg/dL (ref >=50)
LDL Cholesterol (Calc): 128 mg/dL (calc) — ABNORMAL HIGH
Non-HDL Cholesterol (Calc): 156 mg/dL (calc) — ABNORMAL HIGH (ref <130)
Total CHOL/HDL Ratio: 3.1 (calc) (ref <5.0)
Triglycerides: 161 mg/dL — ABNORMAL HIGH (ref <150)

## 2021-02-08 LAB — COMPLETE METABOLIC PANEL WITH GFR
AG Ratio: 1.5 (calc) (ref 1.0–2.5)
ALT: 14 U/L (ref 6–29)
AST: 26 U/L (ref 10–35)
Albumin: 4.1 g/dL (ref 3.6–5.1)
Alkaline phosphatase (APISO): 109 U/L (ref 37–153)
BUN: 12 mg/dL (ref 7–25)
CO2: 27 mmol/L (ref 20–32)
Calcium: 9.5 mg/dL (ref 8.6–10.4)
Chloride: 107 mmol/L (ref 98–110)
Creat: 1.03 mg/dL (ref 0.50–1.05)
GFR, Est African American: 73 mL/min/{1.73_m2} (ref >=60)
GFR, Est Non African American: 63 mL/min/{1.73_m2} (ref >=60)
Globulin: 2.7 g/dL (calc) (ref 1.9–3.7)
Glucose, Bld: 70 mg/dL (ref 65–99)
Potassium: 3.8 mmol/L (ref 3.5–5.3)
Sodium: 141 mmol/L (ref 135–146)
Total Bilirubin: 0.6 mg/dL (ref 0.2–1.2)
Total Protein: 6.8 g/dL (ref 6.1–8.1)

## 2021-02-08 LAB — CBC WITH DIFFERENTIAL/PLATELET
Absolute Monocytes: 443 cells/uL (ref 200–950)
Basophils Absolute: 22 cells/uL (ref 0–200)
Basophils Relative: 0.4 %
Eosinophils Absolute: 59 cells/uL (ref 15–500)
Eosinophils Relative: 1.1 %
HCT: 39 % (ref 35.0–45.0)
Hemoglobin: 12.8 g/dL (ref 11.7–15.5)
Lymphs Abs: 2165 cells/uL (ref 850–3900)
MCH: 30.3 pg (ref 27.0–33.0)
MCHC: 32.8 g/dL (ref 32.0–36.0)
MCV: 92.4 fL (ref 80.0–100.0)
MPV: 12.1 fL (ref 7.5–12.5)
Monocytes Relative: 8.2 %
Neutro Abs: 2711 cells/uL (ref 1500–7800)
Neutrophils Relative %: 50.2 %
Platelets: 288 10*3/uL (ref 140–400)
RBC: 4.22 10*6/uL (ref 3.80–5.10)
RDW: 13.4 % (ref 11.0–15.0)
Total Lymphocyte: 40.1 %
WBC: 5.4 10*3/uL (ref 3.8–10.8)

## 2021-02-08 LAB — HIV-1 RNA QUANT-NO REFLEX-BLD
HIV 1 RNA Quant: NOT DETECTED Copies/mL
HIV-1 RNA Quant, Log: NOT DETECTED Log cps/mL

## 2021-02-12 ENCOUNTER — Other Ambulatory Visit: Payer: Self-pay

## 2021-02-12 ENCOUNTER — Encounter: Payer: Self-pay | Admitting: Family

## 2021-02-12 ENCOUNTER — Ambulatory Visit (INDEPENDENT_AMBULATORY_CARE_PROVIDER_SITE_OTHER): Payer: Self-pay | Admitting: Family

## 2021-02-12 VITALS — BP 110/77 | HR 76 | Temp 98.2°F | Wt 122.0 lb

## 2021-02-12 DIAGNOSIS — B20 Human immunodeficiency virus [HIV] disease: Secondary | ICD-10-CM

## 2021-02-12 DIAGNOSIS — Z113 Encounter for screening for infections with a predominantly sexual mode of transmission: Secondary | ICD-10-CM

## 2021-02-12 DIAGNOSIS — Z0000 Encounter for general adult medical examination without abnormal findings: Secondary | ICD-10-CM

## 2021-02-12 MED ORDER — BIKTARVY 50-200-25 MG PO TABS: ORAL_TABLET | ORAL | 5 refills | Status: DC

## 2021-02-12 NOTE — Progress Notes (Signed)
Brief Narrative   Patient ID: Michelle Waters, female    DOB: 1971-01-29, 50 y.o.   MRN: [id number]  Michelle Waters is a 50 year old African-American female diagnosed with HIV in November 2018 with risk factor being heterosexual contact. CD4 nadir of 1080 and viral load of 472.  Genotype with K103R/V179D with resistance to Efavirinez and Nevirapine. No history of opportunistic infection. ART history has been [employer].   Subjective:    Chief Complaint  Patient presents with  . Follow-up    B20     HPI:  Michelle Waters is a 50 y.o. female with HIV disease last seen on 10/16/2020 with well-controlled virus and good adherence and tolerance to her ART regimen of Biktarvy.  Viral load at the time was undetectable with CD4 count 1109.  Most recent blood work completed on 02/06/2021 with viral load that remains undetectable and CD4 count of 1269.  Kidney function, liver function, electrolytes within normal ranges.  STI testing negative for gonorrhea and chlamydia.  Here today for routine follow-up.  Michelle Waters continues to take her Biktarvy daily as prescribed with no adverse side effects or missed doses since her last office visit.  Overall feeling well today with no new concerns/complaints. Denies fevers, chills, night sweats, headaches, changes in vision, neck pain/stiffness, nausea, diarrhea, vomiting, lesions or rashes.  Michelle Waters has no problems obtaining medication from the pharmacy and remains covered through [facility] and is up to date.  Denies feelings of being down, depressed, or hopeless recently.  Continues to drink alcohol and smoke tobacco pack per day.  No current recreational or illicit drug use.  Declines condoms.  Due for routine dental care.  COVID vaccines and booster up-to-date per recommendations.   Allergies  Allergen Reactions  . Ampicillin Swelling and Rash  . Penicillins Swelling and Rash  . Shrimp [Shellfish Allergy] Swelling  . Codeine Itching  . Hydrocodone Itching       Outpatient Medications Prior to Visit  Medication Sig Dispense Refill  . acetaminophen (TYLENOL) 500 MG tablet Take 1,000 mg by mouth every 6 (six) hours as needed for moderate pain.     Marland Kitchen albuterol (PROVENTIL HFA;VENTOLIN HFA) 108 (90 Base) MCG/ACT inhaler Inhale 2 puffs into the lungs every 6 (six) hours as needed for wheezing or shortness of breath. 54 g 3  . diclofenac Sodium (VOLTAREN) 1 % GEL Apply 2 g topically 4 (four) times daily. 100 g 0  . Ensure (ENSURE) Take 237 mLs by mouth 2 (two) times daily between meals. 237 mL 6  . omeprazole (PRILOSEC) 20 MG capsule Take one capsule by mouth every day before breakfast ( wait 30 minutes  before eating). 30 capsule 4  . sertraline (ZOLOFT) 50 MG tablet TAKE 1/2 TABLET BY MOUTH DAILY FOR 2 WEEKS, THEN TAKE 1 TABLET DAILY 90 tablet 1  . bictegravir-emtricitabine-tenofovir AF (BIKTARVY) 50-200-25 MG TABS tablet Take 1 tablet by mouth daily 30 tablet 5   No facility-administered medications prior to visit.     Past Medical History:  Diagnosis Date  . Acid reflux   . Elevated serum creatinine 10/05/2017  . HIV (human immunodeficiency virus infection) ([place])   . Hypertension      Past Surgical History:  Procedure Laterality Date  . COLPOSCOPY  09/20/2020      . I & D EXTREMITY Right 08/30/2017   Procedure: IRRIGATION AND DEBRIDEMENT INDEX FINGER;  Surgeon: Netta Cedars, MD;  Location: [place];  Service: Orthopedics;  Laterality: Right;  Review of Systems  Constitutional: Negative for appetite change, chills, diaphoresis, fatigue, fever and unexpected weight change.  Eyes:       Negative for acute change in vision  Respiratory: Negative for chest tightness, shortness of breath and wheezing.   Cardiovascular: Negative for chest pain.  Gastrointestinal: Negative for diarrhea, nausea and vomiting.  Genitourinary: Negative for dysuria, pelvic pain and vaginal discharge.  Musculoskeletal: Negative for neck pain and neck  stiffness.  Skin: Negative for rash.  Neurological: Negative for seizures, syncope, weakness and headaches.  Hematological: Negative for adenopathy. Does not bruise/bleed easily.  Psychiatric/Behavioral: Negative for hallucinations.      Objective:    BP 110/77   Pulse 76   Temp 98.2 F (36.8 C) (Oral)   Wt 122 lb (55.3 kg)   BMI 24.64 kg/m  Nursing note and vital signs reviewed.  Physical Exam Constitutional:      General: She is not in acute distress.    Appearance: She is well-developed.  Eyes:     Conjunctiva/sclera: Conjunctivae normal.  Cardiovascular:     Rate and Rhythm: Normal rate and regular rhythm.     Heart sounds: Normal heart sounds. No murmur heard. No friction rub. No gallop.   Pulmonary:     Effort: Pulmonary effort is normal. No respiratory distress.     Breath sounds: Normal breath sounds. No wheezing or rales.  Chest:     Chest wall: No tenderness.  Abdominal:     General: Bowel sounds are normal.     Palpations: Abdomen is soft.     Tenderness: There is no abdominal tenderness.  Musculoskeletal:     Cervical back: Neck supple.  Lymphadenopathy:     Cervical: No cervical adenopathy.  Skin:    General: Skin is warm and dry.     Findings: No rash.  Neurological:     Mental Status: She is alert and oriented to person, place, and time.  Psychiatric:        Behavior: Behavior normal.        Thought Content: Thought content normal.        Judgment: Judgment normal.      Depression screen [facility] 2/9 10/16/2020 09/20/2020 06/27/2020 04/02/2020 11/11/2019  Decreased Interest 1 1 1  0 1  Down, Depressed, Hopeless 1 1 0 0 1  PHQ - 2 Score 2 2 1  0 2  Altered sleeping 0 0 1 - 1  Tired, decreased energy 1 1 0 - 0  Change in appetite 0 1 0 - 1  Feeling bad or failure about yourself  1 1 1  - 1  Trouble concentrating 2 1 1  - 1  Moving slowly or fidgety/restless 2 1 0 - 0  Suicidal thoughts 0 0 0 - 0  PHQ-9 Score 8 7 4  - 6  Difficult doing work/chores Very  difficult - - - -       Assessment & Plan:    Patient Active Problem List   Diagnosis Date Noted  . GERD without esophagitis 10/16/2020  . Cervical dysplasia 09/28/2020  . Low grade squamous intraepithelial lesion on cytologic smear of cervix (LGSIL) 04/02/2020  . Right foot pain 02/15/2020  . 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 Mild depression ([place]) 11/11/2019  . Mild protein-energy malnutrition ([place]) 11/09/2019  . HIV disease ([place]) 10/05/2017  . Healthcare maintenance 10/05/2017  . Tobacco dependence 01/17/2015  . Family history of diabetes mellitus (DM) 01/17/2015     Problem List Items Addressed This Visit  Other   HIV disease ([place])    Michelle Waters continues to have well-controlled HIV disease with good adherence and tolerance to her ART regimen of Biktarvy.  No signs/symptoms of opportunistic infection or progressive HIV.  We reviewed lab work and discussed plan of care.  Continue current dose of Biktarvy.  Will need to renew financial assistance after July 1.  Plan for follow-up in 6 months or sooner if needed with lab work 1 to 2 weeks prior to appointment.      Relevant Medications   bictegravir-emtricitabine-tenofovir AF (BIKTARVY) 50-200-25 MG TABS tablet   Other Relevant Orders   HIV-1 RNA quant-no reflex-bld   T-helper cell (CD4)- ([facility] clinic only)   Comprehensive metabolic panel   Healthcare maintenance     Importance of safe sexual practice to reduce risk of STI.  Condoms declined.  Due for routine dental care which she will schedule independently.  All vaccines up-to-date per recommendations       Other Visit Diagnoses    Screening for STDs (sexually transmitted diseases)    -  Primary   Relevant Orders   RPR       I am having Michelle Waters maintain her albuterol, acetaminophen, diclofenac Sodium, sertraline, omeprazole, Ensure, and Biktarvy.   Meds ordered this encounter  Medications  . bictegravir-emtricitabine-tenofovir AF (BIKTARVY)  50-200-25 MG TABS tablet    Sig: Take 1 tablet by mouth daily    Dispense:  30 tablet    Refill:  5    Order Specific Question:   Supervising Provider    Answer:   Carlyle Basques [[id number]]     Follow-up: Return in about 6 months (around 08/14/2021), or if symptoms worsen or fail to improve.   Terri Piedra, MSN, FNP-C Nurse Practitioner [facility] [place] number: [phone]

## 2021-02-12 NOTE — Patient Instructions (Addendum)
Nice to see you.  Your viral load is undetectable and CD4 count is 1,269.   Continue to take your [place] daily.  Refills have been sent to the pharmacy.  Renew financial assistance after July 1st.   Plan for follow up in 6 months or sooner if needed with lab work 1-2 weeks prior to appointment.   Have a great day and stay safe!

## 2021-02-12 NOTE — Assessment & Plan Note (Signed)
   Importance of safe sexual practice to reduce risk of STI.  Condoms declined.  Due for routine dental care which she will schedule independently.  All vaccines up-to-date per recommendations

## 2021-02-12 NOTE — Assessment & Plan Note (Signed)
Ms. Colledge continues to have well-controlled HIV disease with good adherence and tolerance to her ART regimen of Biktarvy.  No signs/symptoms of opportunistic infection or progressive HIV.  We reviewed lab work and discussed plan of care.  Continue current dose of Biktarvy.  Will need to renew financial assistance after July 1.  Plan for follow-up in 6 months or sooner if needed with lab work 1 to 2 weeks prior to appointment.

## 2021-04-12 ENCOUNTER — Other Ambulatory Visit: Payer: Self-pay | Admitting: Family

## 2021-04-12 DIAGNOSIS — K219 Gastro-esophageal reflux disease without esophagitis: Secondary | ICD-10-CM

## 2021-04-17 ENCOUNTER — Other Ambulatory Visit: Payer: Self-pay | Admitting: Family

## 2021-04-17 DIAGNOSIS — B20 Human immunodeficiency virus [HIV] disease: Secondary | ICD-10-CM

## 2021-05-09 ENCOUNTER — Other Ambulatory Visit: Payer: Self-pay | Admitting: Internal Medicine

## 2021-05-09 DIAGNOSIS — F32A Depression, unspecified: Secondary | ICD-10-CM

## 2021-05-09 DIAGNOSIS — F320 Major depressive disorder, single episode, mild: Secondary | ICD-10-CM

## 2021-05-09 NOTE — Telephone Encounter (Signed)
   Notes to clinic:  Requested script have expired  Review for continued use and refill   Requested Prescriptions  Pending Prescriptions Disp Refills   sertraline (ZOLOFT) 50 MG tablet [Pharmacy Med Name: SERTRALINE 50MG  TABLETS] 90 tablet 1    Sig: TAKE 1/2 TABLET BY MOUTH DAILY FOR 2 WEEKS, THEN TAKE 1 TABLET DAILY      Psychiatry:  Antidepressants - SSRI Failed - 05/09/2021  3:42 AM      Failed - Valid encounter within last 6 months    Recent Outpatient Visits           10 months ago Need for Tdap vaccination   [place], RPH-CPP   10 months ago Well female exam with routine gynecological exam   [place], [place], MD   1 year ago Mild depression [facility])   [place] Ladell [place], MD   1 year ago Mild depression [facility])   [place], MD   5 years ago SOB (shortness of breath)   [place], Jannifer Rodney, NP                Passed - Completed PHQ-2 or PHQ-9 in the last 360 days

## 2021-05-27 ENCOUNTER — Other Ambulatory Visit: Payer: Self-pay

## 2021-05-27 DIAGNOSIS — E441 Mild protein-calorie malnutrition: Secondary | ICD-10-CM

## 2021-05-27 MED ORDER — ENSURE PO LIQD: 237.0000 mL | Freq: Two times a day (BID) | ORAL | 6 refills | Status: DC

## 2021-07-16 ENCOUNTER — Other Ambulatory Visit: Payer: Self-pay

## 2021-07-29 ENCOUNTER — Other Ambulatory Visit: Payer: Self-pay | Admitting: Family

## 2021-07-29 DIAGNOSIS — B20 Human immunodeficiency virus [HIV] disease: Secondary | ICD-10-CM

## 2021-07-29 NOTE — Telephone Encounter (Signed)
Appointment 10/11. No-showed labs 9/27.

## 2021-07-30 ENCOUNTER — Encounter: Payer: Self-pay | Admitting: Family

## 2021-07-30 NOTE — Telephone Encounter (Signed)
Appt 10/12

## 2021-07-31 ENCOUNTER — Ambulatory Visit (INDEPENDENT_AMBULATORY_CARE_PROVIDER_SITE_OTHER): Payer: Self-pay | Admitting: Family

## 2021-07-31 ENCOUNTER — Other Ambulatory Visit: Payer: Self-pay

## 2021-07-31 ENCOUNTER — Encounter: Payer: Self-pay | Admitting: Family

## 2021-07-31 VITALS — BP 135/85 | HR 90 | Temp 98.1°F | Wt 130.0 lb

## 2021-07-31 DIAGNOSIS — Z23 Encounter for immunization: Secondary | ICD-10-CM

## 2021-07-31 DIAGNOSIS — Z79899 Other long term (current) drug therapy: Secondary | ICD-10-CM

## 2021-07-31 DIAGNOSIS — B20 Human immunodeficiency virus [HIV] disease: Secondary | ICD-10-CM

## 2021-07-31 DIAGNOSIS — Z0000 Encounter for general adult medical examination without abnormal findings: Secondary | ICD-10-CM

## 2021-07-31 DIAGNOSIS — Z113 Encounter for screening for infections with a predominantly sexual mode of transmission: Secondary | ICD-10-CM

## 2021-07-31 MED ORDER — BIKTARVY 50-200-25 MG PO TABS: ORAL_TABLET | ORAL | 5 refills | Status: DC

## 2021-07-31 NOTE — Assessment & Plan Note (Signed)
Michelle Waters continues to have well-controlled virus with good adherence and tolerance to her ART regimen of Biktarvy.  No signs/symptoms of opportunistic infection.  We reviewed previous lab work and discussed plan of care.  Check blood work today.  Continue current dose of Biktarvy.  Financial assistance renewed and pending.  She does have approximately 1-1/2 months of Biktarvy remaining.  Plan for follow-up in 6 months or sooner if needed with lab work on the same day.

## 2021-07-31 NOTE — Patient Instructions (Addendum)
Nice to see you. ? ?We will check your lab work today. ? ?Continue to take your medication daily as prescribed. ? ?Refills have been sent to the pharmacy. ? ?Plan for follow up in 6 months or sooner if needed with lab work on the same day. ? ?Have a great day and stay safe! ? ?

## 2021-07-31 NOTE — Progress Notes (Signed)
Brief Narrative   Patient ID: Michelle Waters, female    DOB: 10-27-1970, 50 y.o.   MRN: [id number]  Michelle Waters is a 50 year old African-American female diagnosed with HIV in November 2018 with risk factor being heterosexual contact. CD4 nadir of 1080 and viral load of 472.  Genotype with K103R/V179D with resistance to Efavirinez and Nevirapine. No history of opportunistic infection. ART history has been [employer].  Subjective:    Chief Complaint  Patient presents with   Follow-up    Flu shot     HPI:  Michelle Waters is a 50 y.o. female with HIV disease last seen on 02/12/21 with well controlled virus and good adherence and tolerance to her ART regimen of BIktarvy.  Viral load was undetectable and CD4 count of 1269.  STI testing was negative for gonorrhea and chlamydia.  Kidney function, liver function, electrolytes within normal ranges.  Lipid profile with LDL of 148, triglycerides 161, and HDL 73.  Here today for routine follow-up.  Michelle Waters continues to take her Biktarvy daily as prescribed with no adverse side effects.  Overall feeling well today with no new concerns/complaints. Denies fevers, chills, night sweats, headaches, changes in vision, neck pain/stiffness, nausea, diarrhea, vomiting, lesions or rashes.  Michelle Waters has no problems obtaining medication from the pharmacy and renewed her financial assistance.  Denies feelings of being down, depressed, or hopeless recently.  No current recreational or illicit drug use with continued alcohol consumption and approximately 1/2 pack of cigarettes per day.  Condoms offered and declined.  Healthcare maintenance due includes influenza vaccine.  She is concerned because her husband is having an EGD with concern for possible malignancy.   Allergies  Allergen Reactions   Ampicillin Swelling and Rash   Penicillins Swelling and Rash   Shrimp [Shellfish Allergy] Swelling   Codeine Itching   Hydrocodone Itching      Outpatient  Medications Prior to Visit  Medication Sig Dispense Refill   acetaminophen (TYLENOL) 500 MG tablet Take 1,000 mg by mouth every 6 (six) hours as needed for moderate pain.      albuterol (PROVENTIL HFA;VENTOLIN HFA) 108 (90 Base) MCG/ACT inhaler Inhale 2 puffs into the lungs every 6 (six) hours as needed for wheezing or shortness of breath. 54 g 3   diclofenac Sodium (VOLTAREN) 1 % GEL Apply 2 g topically 4 (four) times daily. 100 g 0   Ensure (ENSURE) Take 237 mLs by mouth 2 (two) times daily between meals. 237 mL 6   omeprazole (PRILOSEC) 20 MG capsule TAKE 1 CAPSULE BY MOUTH EVERY DAY BEFORE BREAKFAST. WAIT 30 MINUTES BEFORE EATING 30 capsule 4   sertraline (ZOLOFT) 50 MG tablet TAKE 1/2 TABLET BY MOUTH DAILY FOR 2 WEEKS, THEN TAKE 1 TABLET DAILY 90 tablet 1   bictegravir-emtricitabine-tenofovir AF (BIKTARVY) 50-200-25 MG TABS tablet Take 1 tablet by mouth daily 30 tablet 5   No facility-administered medications prior to visit.     Past Medical History:  Diagnosis Date   Acid reflux    Elevated serum creatinine 10/05/2017   HIV (human immunodeficiency virus infection) ([place])    Hypertension      Past Surgical History:  Procedure Laterality Date   COLPOSCOPY  09/20/2020       I & D EXTREMITY Right 08/30/2017   Procedure: IRRIGATION AND DEBRIDEMENT INDEX FINGER;  Surgeon: Netta Cedars, MD;  Location: [facility];  Service: Orthopedics;  Laterality: Right;      Review of Systems  Constitutional:  Negative for appetite change, chills, diaphoresis, fatigue, fever and unexpected weight change.  Eyes:        Negative for acute change in vision  Respiratory:  Negative for chest tightness, shortness of breath and wheezing.   Cardiovascular:  Negative for chest pain.  Gastrointestinal:  Negative for diarrhea, nausea and vomiting.  Genitourinary:  Negative for dysuria, pelvic pain and vaginal discharge.  Musculoskeletal:  Negative for neck pain and neck stiffness.  Skin:  Negative for rash.   Neurological:  Negative for seizures, syncope, weakness and headaches.  Hematological:  Negative for adenopathy. Does not bruise/bleed easily.  Psychiatric/Behavioral:  Negative for hallucinations.      Objective:    BP 135/85   Pulse 90   Temp 98.1 F (36.7 C) (Oral)   Wt 130 lb (59 kg)   SpO2 100%   BMI 26.26 kg/m  Nursing note and vital signs reviewed.  Physical Exam Constitutional:      General: She is not in acute distress.    Appearance: She is well-developed.  Eyes:     Conjunctiva/sclera: Conjunctivae normal.  Cardiovascular:     Rate and Rhythm: Normal rate and regular rhythm.     Heart sounds: Normal heart sounds. No murmur heard.   No friction rub. No gallop.  Pulmonary:     Effort: Pulmonary effort is normal. No respiratory distress.     Breath sounds: Normal breath sounds. No wheezing or rales.  Chest:     Chest wall: No tenderness.  Abdominal:     General: Bowel sounds are normal.     Palpations: Abdomen is soft.     Tenderness: There is no abdominal tenderness.  Musculoskeletal:     Cervical back: Neck supple.  Lymphadenopathy:     Cervical: No cervical adenopathy.  Skin:    General: Skin is warm and dry.     Findings: No rash.  Neurological:     Mental Status: She is alert and oriented to person, place, and time.  Psychiatric:        Behavior: Behavior normal.        Thought Content: Thought content normal.        Judgment: Judgment normal.     Depression screen [facility] 2/9 07/31/2021 10/16/2020 09/20/2020 06/27/2020 04/02/2020  Decreased Interest 0 1 1 1  0  Down, Depressed, Hopeless 0 1 1 0 0  PHQ - 2 Score 0 2 2 1  0  Altered sleeping - 0 0 1 -  Tired, decreased energy - 1 1 0 -  Change in appetite - 0 1 0 -  Feeling bad or failure about yourself  - 1 1 1  -  Trouble concentrating - 2 1 1  -  Moving slowly or fidgety/restless - 2 1 0 -  Suicidal thoughts - 0 0 0 -  PHQ-9 Score - 8 7 4  -  Difficult doing work/chores - Very difficult - - -        Assessment & Plan:    Patient Active Problem List   Diagnosis Date Noted   GERD without esophagitis 10/16/2020   Cervical dysplasia 09/28/2020   Low grade squamous intraepithelial lesion on cytologic smear of cervix (LGSIL) 04/02/2020   Right foot pain 02/15/2020   Early syphilis, 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   Mild depression 11/11/2019   Mild protein-energy malnutrition ([place]) 11/09/2019   HIV disease ([place]) 10/05/2017   Healthcare maintenance 10/05/2017   Tobacco dependence 01/17/2015   Family history of diabetes mellitus (DM) 01/17/2015  Problem List Items Addressed This Visit       Other   HIV disease ([place]) - Primary    Michelle Waters continues to have well-controlled virus with good adherence and tolerance to her ART regimen of Biktarvy.  No signs/symptoms of opportunistic infection.  We reviewed previous lab work and discussed plan of care.  Check blood work today.  Continue current dose of Biktarvy.  Financial assistance renewed and pending.  She does have approximately 1-1/2 months of Biktarvy remaining.  Plan for follow-up in 6 months or sooner if needed with lab work on the same day.      Relevant Medications   bictegravir-emtricitabine-tenofovir AF (BIKTARVY) 50-200-25 MG TABS tablet   Other Relevant Orders   HIV-1 RNA quant-no reflex-bld   T-helper cell (CD4)- ([facility] clinic only)   Comprehensive metabolic panel   Healthcare maintenance    Discussed importance of safe sexual practices and condom usage.  Condoms offered and declined. Influenza vaccine updated today.  All other vaccinations up-to-date.      Other Visit Diagnoses     Need for immunization against influenza       Relevant Orders   Flu Vaccine QUAD 72mo+IM (Fluarix, Fluzone & Alfiuria Quad PF) (Completed)   Pharmacologic therapy       Relevant Orders   Lipid panel   Screening for STDs (sexually transmitted diseases)       Relevant Orders   RPR        I am having Michelle Waters maintain her albuterol,  acetaminophen, diclofenac Sodium, sertraline, omeprazole, Ensure, and Biktarvy.   Meds ordered this encounter  Medications   bictegravir-emtricitabine-tenofovir AF (BIKTARVY) 50-200-25 MG TABS tablet    Sig: Take 1 tablet by mouth daily    Dispense:  30 tablet    Refill:  5    Order Specific Question:   Supervising Provider    Answer:   Carlyle Basques [[id number]]     Follow-up: Return in about 6 months (around 01/29/2022), or if symptoms worsen or fail to improve.   Terri Piedra, MSN, FNP-C Nurse Practitioner [facility] for Infectious Disease [place] number: [phone]

## 2021-07-31 NOTE — Assessment & Plan Note (Signed)
   Discussed importance of safe sexual practices and condom usage.  Condoms offered and declined.  Influenza vaccine updated today.  All other vaccinations up-to-date.

## 2021-08-01 ENCOUNTER — Other Ambulatory Visit: Payer: Self-pay

## 2021-08-01 DIAGNOSIS — B20 Human immunodeficiency virus [HIV] disease: Secondary | ICD-10-CM

## 2021-08-01 LAB — T-HELPER CELL (CD4) - (RCID CLINIC ONLY)
CD4 % Helper T Cell: 57 % (ref 33–65)
CD4 T Cell Abs: 1306 /uL (ref 400–1790)

## 2021-08-01 MED ORDER — BIKTARVY 50-200-25 MG PO TABS
ORAL_TABLET | ORAL | 5 refills | Status: DC
Start: 2021-08-01 — End: 2022-02-04

## 2021-08-03 LAB — LIPID PANEL
Cholesterol: 248 mg/dL — ABNORMAL HIGH (ref <200)
HDL: 77 mg/dL (ref >=50)
LDL Cholesterol (Calc): 142 mg/dL (calc) — ABNORMAL HIGH
Non-HDL Cholesterol (Calc): 171 mg/dL (calc) — ABNORMAL HIGH (ref <130)
Total CHOL/HDL Ratio: 3.2 (calc) (ref <5.0)
Triglycerides: 157 mg/dL — ABNORMAL HIGH (ref <150)

## 2021-08-03 LAB — COMPREHENSIVE METABOLIC PANEL
AG Ratio: 1.5 (calc) (ref 1.0–2.5)
ALT: 23 U/L (ref 6–29)
AST: 35 U/L (ref 10–35)
Albumin: 4.3 g/dL (ref 3.6–5.1)
Alkaline phosphatase (APISO): 115 U/L (ref 37–153)
BUN: 14 mg/dL (ref 7–25)
CO2: 29 mmol/L (ref 20–32)
Calcium: 9.6 mg/dL (ref 8.6–10.4)
Chloride: 103 mmol/L (ref 98–110)
Creat: 1 mg/dL (ref 0.50–1.03)
Globulin: 2.9 g/dL (calc) (ref 1.9–3.7)
Glucose, Bld: 91 mg/dL (ref 65–99)
Potassium: 3.9 mmol/L (ref 3.5–5.3)
Sodium: 139 mmol/L (ref 135–146)
Total Bilirubin: 0.4 mg/dL (ref 0.2–1.2)
Total Protein: 7.2 g/dL (ref 6.1–8.1)

## 2021-08-03 LAB — FLUORESCENT TREPONEMAL AB(FTA)-IGG-BLD: Fluorescent Treponemal ABS: REACTIVE — AB

## 2021-08-03 LAB — HIV-1 RNA QUANT-NO REFLEX-BLD
HIV 1 RNA Quant: NOT DETECTED Copies/mL
HIV-1 RNA Quant, Log: NOT DETECTED Log cps/mL

## 2021-08-03 LAB — RPR: RPR Ser Ql: REACTIVE — AB

## 2021-08-03 LAB — RPR TITER: RPR Titer: 1:2 {titer} — ABNORMAL HIGH

## 2021-08-05 ENCOUNTER — Telehealth: Payer: Self-pay

## 2021-08-05 ENCOUNTER — Ambulatory Visit: Payer: Self-pay | Admitting: *Deleted

## 2021-08-05 NOTE — Telephone Encounter (Signed)
-----   Message from [place], [place] sent at 08/05/2021  1:13 PM EDT ----- Please inform Michelle Waters that her lab work looks good with viral load undetectable and CD4 count of 1,306. Kidney function, liver function and electrolyte look good as well.

## 2021-08-05 NOTE — Telephone Encounter (Signed)
Patient notified of results and congratulated for great numbers. Patient stated she's having issues getting her medication because of ADAP. Reports she renewed with her [facility] case worker. RN instructed her to have her CW contact the pharmacy to determine what information they need. Patient verbalized understanding.   Finnbar Cedillos Lorita Officer, RN

## 2021-08-05 NOTE — Telephone Encounter (Signed)
Pt called and reported that she has been advised to see her PCP regarding her cholesterol levels. Pt does not speak clearly and has poor cell connection, unable to decipher the name of the provider she saw today. No appts available until 09/17/2021.   Called patient to review information regarding cholesterol levels. Patient reports she was told to contact PCP from Toniann Ket, FNP to review cholesterol levels. Levels are elevated and patient reports she feels ok but a little dizzy at times when getting up. Denies chest pain, difficulty breathing of loss of balance. Encouraged patient to drink fluids and stand slow and allow time before moving to decrease dizziness. Patient reports it is not all of the time. No dizziness reports now. Care advise given. Patient verbalized understanding of care advise and to call back or go to [facility] or ED if symptoms worsen.    Reason for Disposition  [1] Caller requesting NON-URGENT health information AND [2] PCP's office is the best resource  Answer Assessment - Initial Assessment Questions 1. REASON FOR CALL or QUESTION: "What is your reason for calling today?" or "How can I best help you?" or "What question do you have that I can help answer?"     Patient reports she was told by Toniann Ket, FNP to notify PCP of recent lab results and cholesterol levels.  Protocols used: Information Only Call - No Triage-A-AH

## 2021-08-06 NOTE — Telephone Encounter (Signed)
Pt is scheduled a virtual appt for 10/20

## 2021-08-08 ENCOUNTER — Ambulatory Visit: Payer: Self-pay | Attending: Internal Medicine | Admitting: Internal Medicine

## 2021-08-08 ENCOUNTER — Other Ambulatory Visit: Payer: Self-pay

## 2021-08-08 DIAGNOSIS — E782 Mixed hyperlipidemia: Secondary | ICD-10-CM

## 2021-08-08 DIAGNOSIS — Z8742 Personal history of other diseases of the female genital tract: Secondary | ICD-10-CM

## 2021-08-08 DIAGNOSIS — F17200 Nicotine dependence, unspecified, uncomplicated: Secondary | ICD-10-CM

## 2021-08-08 NOTE — Progress Notes (Signed)
Patient ID: Michelle Waters, female   DOB: [birth year], 50 y.o.   MRN: [id number] Virtual Visit via Telephone Note  I connected with Michelle Waters on 08/08/2021 at 11:59 AM by telephone and verified that I am speaking with the correct person using two identifiers  Location: Patient: home Provider: office  Participants: Myself Patient CMA: Ms. Sallyanne Havers    I discussed the limitations, risks, security and privacy concerns of performing an evaluation and management service by telephone and the availability of in person appointments. I also discussed with the patient that there may be a patient responsible charge related to this service. The patient expressed understanding and agreed to proceed.   History of Present Illness: Pt with hx of HIV, tob dep, depression, syphilis (treated).  Last saw me 03/2020.  Purpose of today's visit is to discuss high cholesterol levels.  HL: Patient's cholesterol level has been checked intermittently by her ID specialist.  Her total and LDL cholesterol have gradually been increasing with most recent total cholesterol being 248 and LDL being 142.  Patient admits that she can do better with her eating habits.  She likes to eat the skin of the chicken.  Tob dep:  1 pk last 3-4 days.  Not ready to quit.  Abn PAP: Patient had abnormal Pap in 2019 which showed LSIL with positive HPV.  Repeat Pap 06/2020 again showed the same results.  She saw the gynecologist Dr. Ilda Basset 09/2020 and had cervical biopsy which revealed low-grade squamous intraepithelial lesion, CIN-1 and endocervical curettage also showing LGSIL CIN1.  he recommended repeat Pap smear in 1 year and yearly Pap smears going forward.   Observations/Objective: Lab Results  Component Value Date   WBC 5.4 02/06/2021   HGB 12.8 02/06/2021   HCT 39.0 02/06/2021   MCV 92.4 02/06/2021   PLT 288 02/06/2021   Lab Results  Component Value Date   CHOL 248 (H) 07/31/2021   HDL 77 07/31/2021   LDLCALC 142 (H)  07/31/2021   TRIG 157 (H) 07/31/2021   CHOLHDL 3.2 07/31/2021   The 10-year ASCVD risk score (Arnett DK, et al., 2019) is: 4.6%   Values used to calculate the score:     Age: 50 years     Sex: Female     Is Non-Hispanic African American: Yes     Diabetic: No     Tobacco smoker: Yes     Systolic Blood Pressure: 094 mmHg     Is BP treated: No     HDL Cholesterol: 77 mg/dL     Total Cholesterol: 248 mg/dL   Assessment and Plan: 1. Mixed hyperlipidemia Went over with the patient her ASCVD score.  At this time we do not need to initiate medication.  Discussed and encourage healthy eating habits including choice of healthy oils, choosing 1% milk instead of whole milk, eating more bake instead of fried meats and not eating the skin of chicken/turkey.  She is agreeable to referral to nutritionist. - Amb ref to Medical Nutrition Therapy-MNT  2. Tobacco dependence Advised to quit.  Patient not ready to give a trial of quitting.  3. History of abnormal cervical Pap smear She will be due for repeat Pap smear in December of this year.  We will have her scheduled at that time.   Follow Up Instructions: 2 mths for PAP   I discussed the assessment and treatment plan with the patient. The patient was provided an opportunity to ask questions and all were answered. The  patient agreed with the plan and demonstrated an understanding of the instructions.   The patient was advised to call back or seek an in-person evaluation if the symptoms worsen or if the condition fails to improve as anticipated.  I  Spent 8 minutes on this telephone encounter  Karle Plumber, MD

## 2021-08-22 ENCOUNTER — Encounter: Payer: Medicaid Other | Attending: Internal Medicine | Admitting: Dietician

## 2021-08-22 ENCOUNTER — Other Ambulatory Visit: Payer: Self-pay

## 2021-08-22 ENCOUNTER — Encounter: Payer: Self-pay | Admitting: Dietician

## 2021-08-22 DIAGNOSIS — E782 Mixed hyperlipidemia: Secondary | ICD-10-CM | POA: Insufficient documentation

## 2021-08-22 NOTE — Progress Notes (Signed)
Medical Nutrition Therapy  Appointment Start time:  [phone]  Appointment End time:  1335  Primary concerns today: Patient would like to learn more about healthy eating  Referral diagnosis: mixed hyperlipidemia Preferred learning style: no preference indicated Learning readiness: ready   NUTRITION ASSESSMENT   Anthropometrics  59" 132 lbs  117 lbs 12/2020  Gaining weight without cause.   Clinical Medical Hx: Mixed hyperlipidemia, HIV, smokes Medications: see list Labs: 07/31/2021 - Cholesterol 248, HDL 77, LDL 142, Triglycerides 157, non HDL 171 Notable Signs/Symptoms: none  Lifestyle & Dietary Hx Patient lives with her husband.  She does the shopping and cooking.  She is not employed.  She is on food stamps and does not have enough money for food at times.  She does use food banks.  Supplements: Ensure bid Sleep: 8-9 hours per night Stress / self-care: variable Current average weekly physical activity: walks, (uses the city bus)  24-Hr Dietary Recall "I eat all day and can't remember what."  Lactose intolerant, allergy to shellfish Ensure bid First Meal: Ensure, boiled egg, rare bacon, fruit Snack: none Second Meal: grilled cheese Snack: toast with cream cheese Third Meal: frozen pizza Snack: occasional sweet Beverages: water, Ensure, Sweet tea, Regular soda, beer  NUTRITION DIAGNOSIS  NB-1.1 Food and nutrition-related knowledge deficit As related to nutrition to decreased cholesterol.  As evidenced by diet hx and patient report.   NUTRITION INTERVENTION  Nutrition education (E-1) on the following topics:  Patient stated that she was aware of the risks of high cholesterol and did not want to discuss it as it made her fearful. Patient states that she is not ready to quit smoking Discussed food access.  Patient uses food pantries when needed.  Uses food stamps.  Discussed that SNAP is doubled at [employer] often.  Discussed that beans are more economical and healthy  protein choice. Discussed current labs and how to decrease by decreasing saturated fat (chicken skin, fatty meat, cheese), and avoiding trans fat (partially hydrogenated fat) Encouraged more fruit and vegetables as budget allows.  Handouts Provided Include  Cholesterol & Triglycerides Types of fat Mediterranean diet  Learning Style & Readiness for Change Teaching method utilized: Visual & Auditory  Demonstrated degree of understanding via: Teach Back  Barriers to learning/adherence to lifestyle change: none  Recommendations Eat more vegetables, beans, fresh fruit, whole grain. Reduce or eliminate cheese Continue to choose leanest meat you can afford. Bake rather than fry Continue to remove the skin from the chicken  Drink water rather than soda and tea most of the time    MONITORING & EVALUATION Dietary intake, weekly physical activity, prn  Next Steps  Patient is to call for follow up as needed.

## 2021-08-23 ENCOUNTER — Telehealth: Payer: Self-pay

## 2021-08-23 NOTE — Telephone Encounter (Signed)
Patient currently a client with [facility]. UMAP/RW updated by [facility] case worker. Patient Established care with PCP who has referred patient to Nutrition for management of high cholesterol.  Patient excited to learn new eating habits. Patient due for Covid Bivalent Booster vaccine and accepts appointment next week.  Michelle Waters

## 2021-08-26 ENCOUNTER — Ambulatory Visit: Payer: Medicaid Other

## 2021-09-02 ENCOUNTER — Encounter: Payer: Self-pay | Admitting: Family

## 2021-09-02 ENCOUNTER — Other Ambulatory Visit: Payer: Self-pay

## 2021-09-02 ENCOUNTER — Ambulatory Visit (INDEPENDENT_AMBULATORY_CARE_PROVIDER_SITE_OTHER): Payer: Self-pay

## 2021-09-02 DIAGNOSIS — Z23 Encounter for immunization: Secondary | ICD-10-CM

## 2021-09-02 NOTE — Progress Notes (Signed)
   Covid-19 Vaccination Clinic  Name:  [name]    MRN: [id number] DOB: 11/02/1970  09/02/2021  [name] was observed post Covid-19 immunization for 15 minutes without incident. She was provided with Vaccine Information Sheet and instruction to access the V-Safe system.   [name] was instructed to call 911 with any severe reactions post vaccine: Difficulty breathing  Swelling of face and throat  A fast heartbeat  A bad rash all over body  Dizziness and weakness   Immunizations Administered     Name Date Dose VIS Date Route   Pfizer Covid-19 Vaccine Bivalent Booster 09/02/2021 10:00 AM 0.3 mL 06/19/2021 Intramuscular   Manufacturer: [place]   Lot: [id number]   [place]: [place], [place]

## 2021-11-01 ENCOUNTER — Other Ambulatory Visit: Payer: Self-pay | Admitting: Family

## 2021-11-01 DIAGNOSIS — K219 Gastro-esophageal reflux disease without esophagitis: Secondary | ICD-10-CM

## 2021-11-01 NOTE — Telephone Encounter (Signed)
Okay to refill or do you want PCP to take over?

## 2021-12-13 DIAGNOSIS — Z20822 Contact with and (suspected) exposure to covid-19: Secondary | ICD-10-CM | POA: Diagnosis not present

## 2021-12-20 DIAGNOSIS — Z20822 Contact with and (suspected) exposure to covid-19: Secondary | ICD-10-CM | POA: Diagnosis not present

## 2021-12-24 ENCOUNTER — Other Ambulatory Visit: Payer: Self-pay

## 2021-12-24 DIAGNOSIS — E441 Mild protein-calorie malnutrition: Secondary | ICD-10-CM

## 2021-12-24 MED ORDER — ENSURE PO LIQD: 237.0000 mL | Freq: Two times a day (BID) | ORAL | 6 refills | Status: DC

## 2022-02-04 ENCOUNTER — Other Ambulatory Visit: Payer: Self-pay

## 2022-02-04 ENCOUNTER — Ambulatory Visit (INDEPENDENT_AMBULATORY_CARE_PROVIDER_SITE_OTHER): Payer: BC Managed Care – PPO | Admitting: Family

## 2022-02-04 ENCOUNTER — Encounter: Payer: Self-pay | Admitting: Family

## 2022-02-04 VITALS — BP 130/88 | HR 73 | Temp 98.2°F | Wt 130.0 lb

## 2022-02-04 DIAGNOSIS — Z79899 Other long term (current) drug therapy: Secondary | ICD-10-CM | POA: Diagnosis not present

## 2022-02-04 DIAGNOSIS — B20 Human immunodeficiency virus [HIV] disease: Secondary | ICD-10-CM

## 2022-02-04 DIAGNOSIS — F17200 Nicotine dependence, unspecified, uncomplicated: Secondary | ICD-10-CM

## 2022-02-04 DIAGNOSIS — A515 Early syphilis, latent: Secondary | ICD-10-CM

## 2022-02-04 DIAGNOSIS — Z113 Encounter for screening for infections with a predominantly sexual mode of transmission: Secondary | ICD-10-CM | POA: Diagnosis not present

## 2022-02-04 DIAGNOSIS — Z0000 Encounter for general adult medical examination without abnormal findings: Secondary | ICD-10-CM

## 2022-02-04 MED ORDER — BIKTARVY 50-200-25 MG PO TABS: ORAL_TABLET | ORAL | 5 refills | Status: DC

## 2022-02-04 NOTE — Progress Notes (Signed)
? ? ?Brief Narrative  ? ?Patient ID: Michelle Waters, female    DOB: 1971/02/05, 52 y.o.   MRN: [id number] ? ?Michelle Waters is a 51 year old African-American female diagnosed with HIV in November 2018 with risk factor being heterosexual contact. CD4 nadir of 1080 and viral load of 472.  Genotype with K103R/V179D with resistance to Efavirinez and Nevirapine. No history of opportunistic infection. ART history has been [employer] ? ?Subjective:  ?  ?Chief Complaint  ?Patient presents with  ? HIV Positive/AIDS  ? ? ?HPI: ? ?Michelle Waters is a 51 y.o. female with HIV disease last seen on 07/31/2021 with well-controlled virus and good adherence and tolerance to her ART regimen of Biktarvy.  Viral load at the time was undetectable with CD4 count of 1306.  RPR was serofast at 1: 2.  Kidney function, liver function, electrolytes within normal ranges.  Cholesterol with triglycerides of 157, LDL 142, and HDL of 77.  Here today for routine follow-up. ? ?Michelle Waters continues to take her Biktarvy daily as prescribed with no adverse side effects.  Overall feeling well today with no new concerns/complaints. Denies fevers, chills, night sweats, headaches, changes in vision, neck pain/stiffness, nausea, diarrhea, vomiting, lesions or rashes. ? ?Michelle Waters has no problems obtaining medication from the pharmacy remains covered by [facility].  Denies feelings of being down, depressed, or hopeless recently.  No current recreational or illicit drug use with alcohol on occasion and tobacco daily.  Condoms offered.  Routine vaccinations up-to-date per recommendations. ? ? ?Allergies  ?Allergen Reactions  ? Ampicillin Swelling and Rash  ? Penicillins Swelling and Rash  ? Shrimp [Shellfish Allergy] Swelling  ? Codeine Itching  ? Hydrocodone Itching  ? ? ? ? ?Outpatient Medications Prior to Visit  ?Medication Sig Dispense Refill  ? acetaminophen (TYLENOL) 500 MG tablet Take 1,000 mg by mouth every 6 (six) hours as needed for moderate pain.      ? albuterol (PROVENTIL HFA;VENTOLIN HFA) 108 (90 Base) MCG/ACT inhaler Inhale 2 puffs into the lungs every 6 (six) hours as needed for wheezing or shortness of breath. 54 g 3  ? diclofenac Sodium (VOLTAREN) 1 % GEL Apply 2 g topically 4 (four) times daily. 100 g 0  ? Ensure (ENSURE) Take 237 mLs by mouth 2 (two) times daily between meals. 237 mL 6  ? omeprazole (PRILOSEC) 20 MG capsule TAKE 1 CAPSULE BY MOUTH EVERY DAY BEFORE BREAKFAST. WAIT 30 MINUTES BEFORE EATING 30 capsule 4  ? sertraline (ZOLOFT) 50 MG tablet TAKE 1/2 TABLET BY MOUTH DAILY FOR 2 WEEKS, THEN TAKE 1 TABLET DAILY 90 tablet 1  ? bictegravir-emtricitabine-tenofovir AF (BIKTARVY) 50-200-25 MG TABS tablet Take 1 tablet by mouth daily 30 tablet 5  ? ?No facility-administered medications prior to visit.  ? ? ? ?Past Medical History:  ?Diagnosis Date  ? Acid reflux   ? Elevated serum creatinine 10/05/2017  ? HIV (human immunodeficiency virus infection) ([place])   ? Hypertension   ? ? ? ?Past Surgical History:  ?Procedure Laterality Date  ? COLPOSCOPY  09/20/2020  ?    ? I & D EXTREMITY Right 08/30/2017  ? Procedure: IRRIGATION AND DEBRIDEMENT INDEX FINGER;  Surgeon: Netta Cedars, MD;  Location: [place];  Service: Orthopedics;  Laterality: Right;  ? ? ? ? ?Review of Systems  ?Constitutional:  Negative for appetite change, chills, diaphoresis, fatigue, fever and unexpected weight change.  ?Eyes:   ?     Negative for acute change in vision  ?  Respiratory:  Negative for chest tightness, shortness of breath and wheezing.   ?Cardiovascular:  Negative for chest pain.  ?Gastrointestinal:  Negative for diarrhea, nausea and vomiting.  ?Genitourinary:  Negative for dysuria, pelvic pain and vaginal discharge.  ?Musculoskeletal:  Negative for neck pain and neck stiffness.  ?Skin:  Negative for rash.  ?Neurological:  Negative for seizures, syncope, weakness and headaches.  ?Hematological:  Negative for adenopathy. Does not bruise/bleed easily.  ?Psychiatric/Behavioral:   Negative for hallucinations.   ?   ?Objective:  ?  ?BP 130/88   Pulse 73   Temp 98.2 ?F (36.8 ?C) (Oral)   Wt 130 lb (59 kg)   BMI 26.26 kg/m?  ?Nursing note and vital signs reviewed. ? ?Physical Exam ?Constitutional:   ?   General: She is not in acute distress. ?   Appearance: She is well-developed.  ?Eyes:  ?   Conjunctiva/sclera: Conjunctivae normal.  ?Cardiovascular:  ?   Rate and Rhythm: Normal rate and regular rhythm.  ?   Heart sounds: Normal heart sounds. No murmur heard. ?  No friction rub. No gallop.  ?Pulmonary:  ?   Effort: Pulmonary effort is normal. No respiratory distress.  ?   Breath sounds: Normal breath sounds. No wheezing or rales.  ?Chest:  ?   Chest wall: No tenderness.  ?Abdominal:  ?   General: Bowel sounds are normal.  ?   Palpations: Abdomen is soft.  ?   Tenderness: There is no abdominal tenderness.  ?Musculoskeletal:  ?   Cervical back: Neck supple.  ?Lymphadenopathy:  ?   Cervical: No cervical adenopathy.  ?Skin: ?   General: Skin is warm and dry.  ?   Findings: No rash.  ?Neurological:  ?   Mental Status: She is alert and oriented to person, place, and time.  ?Psychiatric:     ?   Behavior: Behavior normal.     ?   Thought Content: Thought content normal.     ?   Judgment: Judgment normal.  ? ? ? ? ?  02/04/2022  ? 10:53 AM 08/22/2021  ?  1:35 PM 08/08/2021  ? 10:09 AM 07/31/2021  ?  3:23 PM 10/16/2020  ? 11:20 AM  ?Depression screen PHQ 2/9  ?Decreased Interest 0 0 0 0 1  ?Down, Depressed, Hopeless 0 1 1 0 1  ?PHQ - 2 Score 0 1 1 0 2  ?Altered sleeping     0  ?Tired, decreased energy     1  ?Change in appetite     0  ?Feeling bad or failure about yourself      1  ?Trouble concentrating     2  ?Moving slowly or fidgety/restless     2  ?Suicidal thoughts     0  ?PHQ-9 Score     8  ?Difficult doing work/chores     Very difficult  ?  ?   ?Assessment & Plan:  ? ? ?Patient Active Problem List  ? Diagnosis Date Noted  ? GERD without esophagitis 10/16/2020  ? Cervical dysplasia 09/28/2020  ?  Low grade squamous intraepithelial lesion on cytologic smear of cervix (LGSIL) 04/02/2020  ? Right foot pain 02/15/2020  ? Early syphilis, 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  ? Mild depression 11/11/2019  ? Mild protein-energy malnutrition ([place]) 11/09/2019  ? HIV disease ([place]) 10/05/2017  ? Healthcare maintenance 10/05/2017  ? Tobacco dependence 01/17/2015  ? Family history of diabetes mellitus (DM) 01/17/2015  ? ? ? ?Problem List Items Addressed This Visit   ? ?  ?  Other  ? Tobacco dependence  ?  Continues to smoke tobacco daily. Reiterated the risks of cardiovascular, respiratory and possible malignant disease in the future. In the pre-contemplation stage and not ready to quit at this time.  ? ?  ?  ? HIV disease ([place]) - Primary  ?  Ms. Harpham continues to have well-controlled virus with good adherence and tolerance to her ART regimen of Biktarvy.  Reviewed previous lab work and discussed plan of care.  Check blood work today.  Continue current dose of Biktarvy.  Plan for follow-up in 6 months or sooner if needed with lab work on the same day. ? ?  ?  ? Relevant Medications  ? bictegravir-emtricitabine-tenofovir AF (BIKTARVY) 50-200-25 MG TABS tablet  ? Other Relevant Orders  ? Comprehensive metabolic panel  ? HIV-1 RNA quant-no reflex-bld  ? T-helper cell (CD4)- ([facility] clinic only)  ? Healthcare maintenance  ?  Discussed importance of safe sexual practices and condom use.  Condoms offered. ?Routine vaccinations up-to-date per recommendations. ?Discussed importance of routine dental care which she will schedule independently.  Can refer to [facility] if needed.  ?Cervical and breast cancer screenings up to date.  ?Appears due for colon cancer screening.  ?  ?  ? Early syphilis, latent  ?  Previously treated at 1: 61 and currently serofast at 1: 2.  No treatment indicated.  Recheck RPR today. ? ?  ?  ? Relevant Medications  ? bictegravir-emtricitabine-tenofovir AF (BIKTARVY) 50-200-25 MG TABS tablet  ? ?Other Visit Diagnoses   ? ?  Screening for STDs (sexually transmitted diseases)      ? Relevant Orders  ? RPR  ? Pharmacologic therapy      ? Relevant Orders  ? Lipid panel  ? ?  ? ? ? ?I am having Ashlin L. Charles maintain her albuterol,

## 2022-02-04 NOTE — Assessment & Plan Note (Addendum)
?   Discussed importance of safe sexual practices and condom use.  Condoms offered. ?? Routine vaccinations up-to-date per recommendations. ?? Discussed importance of routine dental care which she will schedule independently.  Can refer to [facility] if needed.  ?? Cervical and breast cancer screenings up to date.  ?? Appears due for colon cancer screening.  ?

## 2022-02-04 NOTE — Assessment & Plan Note (Signed)
Michelle Waters continues to have well-controlled virus with good adherence and tolerance to her ART regimen of Biktarvy.  Reviewed previous lab work and discussed plan of care.  Check blood work today.  Continue current dose of Biktarvy.  Plan for follow-up in 6 months or sooner if needed with lab work on the same day. ?

## 2022-02-04 NOTE — Assessment & Plan Note (Signed)
Previously treated at 1: 63 and currently serofast at 1: 2.  No treatment indicated.  Recheck RPR today. ?

## 2022-02-04 NOTE — Patient Instructions (Addendum)
Nice to see you. ? ?We will check your lab work today. ? ?Continue to take your medication daily as prescribed. ? ?Refills have been sent to the pharmacy. ? ?Plan for follow up in 6 months or sooner if needed with lab work on the same day. ? ?Have a great day and stay safe! ? ?

## 2022-02-04 NOTE — Assessment & Plan Note (Signed)
Continues to smoke tobacco daily. Reiterated the risks of cardiovascular, respiratory and possible malignant disease in the future. In the pre-contemplation stage and not ready to quit at this time.  ?

## 2022-02-06 LAB — HELPER T-LYMPH-CD4 (ARMC ONLY)
% CD 4 Pos. Lymph.: 54.7 % (ref 30.8–58.5)
Absolute CD 4 Helper: 766 /uL (ref 359–1519)
Basophils Absolute: 0 10*3/uL (ref 0.0–0.2)
Basos: 0 %
EOS (ABSOLUTE): 0 10*3/uL (ref 0.0–0.4)
Eos: 1 %
Hematocrit: 39.7 % (ref 34.0–46.6)
Hemoglobin: 12.9 g/dL (ref 11.1–15.9)
Immature Grans (Abs): 0 10*3/uL (ref 0.0–0.1)
Immature Granulocytes: 0 %
Lymphocytes Absolute: 1.4 10*3/uL (ref 0.7–3.1)
Lymphs: 27 %
MCH: 30.1 pg (ref 26.6–33.0)
MCHC: 32.5 g/dL (ref 31.5–35.7)
MCV: 93 fL (ref 79–97)
Monocytes Absolute: 0.3 10*3/uL (ref 0.1–0.9)
Monocytes: 6 %
Neutrophils Absolute: 3.4 10*3/uL (ref 1.4–7.0)
Neutrophils: 66 %
Platelets: 261 10*3/uL (ref 150–450)
RBC: 4.28 x10E6/uL (ref 3.77–5.28)
RDW: 13.4 % (ref 11.7–15.4)
WBC: 5.2 10*3/uL (ref 3.4–10.8)

## 2022-02-07 ENCOUNTER — Other Ambulatory Visit (HOSPITAL_COMMUNITY): Payer: Self-pay

## 2022-02-07 ENCOUNTER — Telehealth: Payer: BC Managed Care – PPO

## 2022-02-07 NOTE — Telephone Encounter (Signed)
PA for omeprazole was denied. Will route to provider.  ? ?Beryle Flock, RN ? ?

## 2022-02-07 NOTE — Telephone Encounter (Signed)
Insurance will not cover omeprazole since it is an over the counter medication. Called patient to let her know, no answer and no voicemail box.  ? ?Beryle Flock, RN ? ?

## 2022-02-07 NOTE — Telephone Encounter (Signed)
PA for omeprazole submitted via cover my meds 02/07/22. Awaiting response.  ? ?Key: [id number] ? ?Beryle Flock, RN ? ?

## 2022-02-10 DIAGNOSIS — Z20822 Contact with and (suspected) exposure to covid-19: Secondary | ICD-10-CM | POA: Diagnosis not present

## 2022-02-10 LAB — COMPREHENSIVE METABOLIC PANEL
AG Ratio: 1.4 (calc) (ref 1.0–2.5)
ALT: 18 U/L (ref 6–29)
AST: 30 U/L (ref 10–35)
Albumin: 4.2 g/dL (ref 3.6–5.1)
Alkaline phosphatase (APISO): 122 U/L (ref 37–153)
BUN/Creatinine Ratio: 8 (calc) (ref 6–22)
BUN: 6 mg/dL — ABNORMAL LOW (ref 7–25)
CO2: 25 mmol/L (ref 20–32)
Calcium: 9.5 mg/dL (ref 8.6–10.4)
Chloride: 107 mmol/L (ref 98–110)
Creat: 0.8 mg/dL (ref 0.50–1.03)
Globulin: 3 g/dL (calc) (ref 1.9–3.7)
Glucose, Bld: 76 mg/dL (ref 65–99)
Potassium: 4 mmol/L (ref 3.5–5.3)
Sodium: 141 mmol/L (ref 135–146)
Total Bilirubin: 0.3 mg/dL (ref 0.2–1.2)
Total Protein: 7.2 g/dL (ref 6.1–8.1)

## 2022-02-10 LAB — FLUORESCENT TREPONEMAL AB(FTA)-IGG-BLD: Fluorescent Treponemal ABS: REACTIVE — AB

## 2022-02-10 LAB — HIV-1 RNA QUANT-NO REFLEX-BLD

## 2022-02-10 LAB — LIPID PANEL
Cholesterol: 253 mg/dL — ABNORMAL HIGH (ref <200)
HDL: 81 mg/dL (ref >=50)
LDL Cholesterol (Calc): 135 mg/dL (calc) — ABNORMAL HIGH
Non-HDL Cholesterol (Calc): 172 mg/dL (calc) — ABNORMAL HIGH (ref <130)
Total CHOL/HDL Ratio: 3.1 (calc) (ref <5.0)
Triglycerides: 230 mg/dL — ABNORMAL HIGH (ref <150)

## 2022-02-10 LAB — RPR: RPR Ser Ql: REACTIVE — AB

## 2022-02-10 LAB — RPR TITER: RPR Titer: 1:2 {titer} — ABNORMAL HIGH

## 2022-02-10 NOTE — Telephone Encounter (Signed)
Patient advised insurance does not cover omeprazole and will need to be purchased OTC. Patient verbalized understanding ?

## 2022-02-11 LAB — T-HELPER CELL (CD4) - (RCID CLINIC ONLY)

## 2022-03-24 DIAGNOSIS — Z20822 Contact with and (suspected) exposure to covid-19: Secondary | ICD-10-CM | POA: Diagnosis not present

## 2022-03-26 ENCOUNTER — Ambulatory Visit (HOSPITAL_COMMUNITY)
Admission: EM | Admit: 2022-03-26 | Discharge: 2022-03-26 | Disposition: A | Discharge status: Home / Self Care | Payer: BC Managed Care – PPO | Attending: Physician Assistant | Admitting: Physician Assistant

## 2022-03-26 ENCOUNTER — Encounter (HOSPITAL_COMMUNITY): Payer: Self-pay | Admitting: Emergency Medicine

## 2022-03-26 ENCOUNTER — Telehealth: Payer: Self-pay

## 2022-03-26 ENCOUNTER — Encounter: Payer: Self-pay | Admitting: Gastroenterology

## 2022-03-26 DIAGNOSIS — R197 Diarrhea, unspecified: Secondary | ICD-10-CM | POA: Diagnosis not present

## 2022-03-26 DIAGNOSIS — B20 Human immunodeficiency virus [HIV] disease: Secondary | ICD-10-CM | POA: Insufficient documentation

## 2022-03-26 DIAGNOSIS — R112 Nausea with vomiting, unspecified: Secondary | ICD-10-CM | POA: Diagnosis not present

## 2022-03-26 DIAGNOSIS — R1084 Generalized abdominal pain: Secondary | ICD-10-CM | POA: Diagnosis not present

## 2022-03-26 LAB — COMPREHENSIVE METABOLIC PANEL
ALT: 19 U/L (ref 0–44)
AST: 24 U/L (ref 15–41)
Albumin: 4.1 g/dL (ref 3.5–5.0)
Alkaline Phosphatase: 124 U/L (ref 38–126)
Anion gap: 11 (ref 5–15)
BUN: 9 mg/dL (ref 6–20)
CO2: 24 mmol/L (ref 22–32)
Calcium: 10 mg/dL (ref 8.9–10.3)
Chloride: 102 mmol/L (ref 98–111)
Creatinine, Ser: 1.06 mg/dL — ABNORMAL HIGH (ref 0.44–1.00)
GFR, Estimated: >60 mL/min (ref >60)
Glucose, Bld: 96 mg/dL (ref 70–99)
Potassium: 3.4 mmol/L — ABNORMAL LOW (ref 3.5–5.1)
Sodium: 137 mmol/L (ref 135–145)
Total Bilirubin: 0.5 mg/dL (ref 0.3–1.2)
Total Protein: 8 g/dL (ref 6.5–8.1)

## 2022-03-26 LAB — CBC WITH DIFFERENTIAL/PLATELET
Abs Immature Granulocytes: 0.02 10*3/uL (ref 0.00–0.07)
Basophils Absolute: 0 10*3/uL (ref 0.0–0.1)
Basophils Relative: 0 %
Eosinophils Absolute: 0.1 10*3/uL (ref 0.0–0.5)
Eosinophils Relative: 1 %
HCT: 41.2 % (ref 36.0–46.0)
Hemoglobin: 13.3 g/dL (ref 12.0–15.0)
Immature Granulocytes: 0 %
Lymphocytes Relative: 23 %
Lymphs Abs: 1.9 10*3/uL (ref 0.7–4.0)
MCH: 29.7 pg (ref 26.0–34.0)
MCHC: 32.3 g/dL (ref 30.0–36.0)
MCV: 92 fL (ref 80.0–100.0)
Monocytes Absolute: 0.6 10*3/uL (ref 0.1–1.0)
Monocytes Relative: 7 %
Neutro Abs: 5.6 10*3/uL (ref 1.7–7.7)
Neutrophils Relative %: 69 %
Platelets: 321 10*3/uL (ref 150–400)
RBC: 4.48 MIL/uL (ref 3.87–5.11)
RDW: 13.2 % (ref 11.5–15.5)
WBC: 8.3 10*3/uL (ref 4.0–10.5)
nRBC: 0 % (ref 0.0–0.2)

## 2022-03-26 LAB — LIPASE, BLOOD: Lipase: 27 U/L (ref 11–51)

## 2022-03-26 MED ORDER — LIDOCAINE VISCOUS HCL 2 % MT SOLN
OROMUCOSAL | Status: AC
  Filled 2022-03-26: qty 15

## 2022-03-26 MED ORDER — LIDOCAINE VISCOUS HCL 2 % MT SOLN
15.0000 mL | Freq: Once | OROMUCOSAL | Status: AC
  Administered 2022-03-26: 15 mL via ORAL

## 2022-03-26 MED ORDER — ALUM & MAG HYDROXIDE-SIMETH 200-200-20 MG/5ML PO SUSP
30.0000 mL | Freq: Once | ORAL | Status: AC
  Administered 2022-03-26: 30 mL via ORAL

## 2022-03-26 MED ORDER — ALUM & MAG HYDROXIDE-SIMETH 200-200-20 MG/5ML PO SUSP
ORAL | Status: AC
  Filled 2022-03-26: qty 30

## 2022-03-26 MED ORDER — PANTOPRAZOLE SODIUM 40 MG PO TBEC: 40.0000 mg | DELAYED_RELEASE_TABLET | Freq: Every day | ORAL | 0 refills | Status: DC

## 2022-03-26 NOTE — ED Notes (Signed)
Pt tried to give a stool sample twice and was unable too. Pt advised to bring a sample back within a couple days

## 2022-03-26 NOTE — ED Provider Notes (Signed)
[place]    CSN: [id number] Arrival date & time: 03/26/22  9562      History   Chief Complaint Chief Complaint  Patient presents with   Gastroesophageal Reflux   Diarrhea    HPI Michelle LYTTLE is a 51 y.o. female.   Patient presents today with a several week history of nausea, vomiting, diarrhea, intermittent abdominal pain.  She reports that anytime she eats or drinks that she has diarrhea and abdominal upset including a burning sensation in her esophagus.  She does have a history of acid reflux but has not been taking PPI as she could not afford this medication since her insurance would not cover it.  She is unsure when she last took this medication.  She denies any known sick contacts, recent travel, medication changes, dietary changes.  She does not take GLP-1 agonist and denies history of pancreatitis.  She does have a history of HIV but was last seen by infectious disease April 2023 at which point she had normal CD4 counts.  Reports adherence to medications.  She does report drinking alcohol averaging 6 beers per night but this has not changed recently.  She does take over-the-counter pain relievers but is unsure if this includes an NSAID.  Reports taking approximately 2 doses per 24 hours.  She denies any severe abdominal pain.  Reports diarrhea is severe and she describes this as watery, foul-smelling, without blood or mucus.  Reports she is having to wear a depends due to severity of symptoms.  Denies history of gastrointestinal disorder.   Past Medical History:  Diagnosis Date   Acid reflux    Elevated serum creatinine 10/05/2017   HIV (human immunodeficiency virus infection) ([place])    Hypertension     Patient Active Problem List   Diagnosis Date Noted   GERD without esophagitis 10/16/2020   Cervical dysplasia 09/28/2020   Low grade squamous intraepithelial lesion on cytologic smear of cervix (LGSIL) 04/02/2020   Right foot pain 02/15/2020   Early syphilis,  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   Mild depression 11/11/2019   Mild protein-energy malnutrition ([place]) 11/09/2019   HIV disease ([place]) 10/05/2017   Healthcare maintenance 10/05/2017   Tobacco dependence 01/17/2015   Family history of diabetes mellitus (DM) 01/17/2015    Past Surgical History:  Procedure Laterality Date   COLPOSCOPY  09/20/2020       I & D EXTREMITY Right 08/30/2017   Procedure: IRRIGATION AND DEBRIDEMENT INDEX FINGER;  Surgeon: Netta Cedars, MD;  Location: [place];  Service: Orthopedics;  Laterality: Right;    OB History   No obstetric history on file.      Home Medications    Prior to Admission medications   Medication Sig Start Date End Date Taking? Authorizing Provider  pantoprazole (PROTONIX) 40 MG tablet Take 1 tablet (40 mg total) by mouth daily. 03/26/22  Yes Frimet Durfee, Derry Skill, PA-C  acetaminophen (TYLENOL) 500 MG tablet Take 1,000 mg by mouth every 6 (six) hours as needed for moderate pain.     [provider]  albuterol (PROVENTIL HFA;VENTOLIN HFA) 108 (90 Base) MCG/ACT inhaler Inhale 2 puffs into the lungs every 6 (six) hours as needed for wheezing or shortness of breath. 11/21/15   Tresa Garter, MD  bictegravir-emtricitabine-tenofovir AF (BIKTARVY) 50-200-25 MG TABS tablet Take 1 tablet by mouth daily 02/04/22   Golden Circle, FNP  diclofenac Sodium (VOLTAREN) 1 % GEL Apply 2 g topically 4 (four) times daily. 02/09/20   Caccavale, Sophia, PA-C  Ensure (ENSURE) Take 237 mLs by mouth 2 (two) times daily between meals. 12/24/21   Golden Circle, FNP  sertraline (ZOLOFT) 50 MG tablet TAKE 1/2 TABLET BY MOUTH DAILY FOR 2 WEEKS, THEN TAKE 1 TABLET DAILY 04/22/20   Ladell Pier, MD    Family History Family History  Problem Relation Age of Onset   Hypertension Mother    Diabetes Mother    Lung cancer Maternal Grandmother    Lung cancer Maternal Uncle     Social History Social History   Tobacco Use   Smoking status: Every Day    Packs/day: 0.50     Types: Cigarettes   Smokeless tobacco: Never  Vaping Use   Vaping Use: Never used  Substance Use Topics   Alcohol use: Yes    Alcohol/week: 15.0 standard drinks    Types: 15 Cans of beer per week   Drug use: No     Allergies   Ampicillin, Penicillins, Shrimp [shellfish allergy], Codeine, and Hydrocodone   Review of Systems Review of Systems  Constitutional:  Positive for activity change. Negative for appetite change, fatigue and fever.  Respiratory:  Negative for cough and shortness of breath.   Cardiovascular:  Negative for chest pain.  Gastrointestinal:  Positive for abdominal pain, diarrhea, nausea and vomiting.  Neurological:  Negative for dizziness, light-headedness and headaches.    Physical Exam Triage Vital Signs ED Triage Vitals  Enc Vitals Group     BP 03/26/22 1033 140/73     Pulse Rate 03/26/22 1033 78     Resp 03/26/22 1033 18     Temp 03/26/22 1034 99.1 F (37.3 C)     Temp src --      SpO2 03/26/22 1033 100 %     Weight --      Height --      Head Circumference --      Peak Flow --      Pain Score 03/26/22 1033 10     Pain Loc --      Pain Edu? --      Excl. in [place]? --    No data found.  Updated Vital Signs BP 140/73   Pulse 78   Temp 99.1 F (37.3 C)   Resp 18   SpO2 100%   Visual Acuity Right Eye Distance:   Left Eye Distance:   Bilateral Distance:    Right Eye Near:   Left Eye Near:    Bilateral Near:     Physical Exam Vitals reviewed.  Constitutional:      General: She is awake. She is not in acute distress.    Appearance: Normal appearance. She is well-developed. She is not ill-appearing.     Comments: Very pleasant female appears stated age in no acute distress sitting comfortably in exam room  HENT:     Head: Normocephalic and atraumatic.  Cardiovascular:     Rate and Rhythm: Normal rate and regular rhythm.     Heart sounds: Normal heart sounds, S1 normal and S2 normal. No murmur heard. Pulmonary:     Effort: Pulmonary  effort is normal.     Breath sounds: Normal breath sounds. No wheezing, rhonchi or rales.     Comments: Clear to auscultation bilaterally Abdominal:     General: Bowel sounds are normal.     Palpations: Abdomen is soft.     Tenderness: There is no abdominal tenderness. There is no right CVA tenderness, left CVA tenderness, guarding or rebound.  Comments: Benign abdominal exam.  No evidence of acute abdomen on physical exam.  Psychiatric:        Behavior: Behavior is cooperative.     UC Treatments / Results  Labs (all labs ordered are listed, but only abnormal results are displayed) Labs Reviewed  GASTROINTESTINAL PANEL BY PCR, STOOL (REPLACES STOOL CULTURE)  C DIFFICILE QUICK SCREEN W PCR REFLEX    CBC WITH DIFFERENTIAL/PLATELET  COMPREHENSIVE METABOLIC PANEL  LIPASE, BLOOD    EKG   Radiology No results found.  Procedures Procedures (including critical care time)  Medications Ordered in UC Medications  alum & mag hydroxide-simeth (MAALOX/MYLANTA) 200-200-20 MG/5ML suspension 30 mL (30 mLs Oral Given 03/26/22 1110)    And  lidocaine (XYLOCAINE) 2 % viscous mouth solution 15 mL (15 mLs Oral Given 03/26/22 1111)    Initial Impression / Assessment and Plan / UC Course  I have reviewed the triage vital signs and the nursing notes.  Pertinent labs & imaging results that were available during my care of the patient were reviewed by me and considered in my medical decision making (see chart for details).     Vital signs and physical exam reassuring today; no indication for emergent evaluation or imaging.  Patient is well-appearing, afebrile, nontoxic, nontachycardic.  CBC, CMP, lipase obtained today-results pending.  She was given GI cocktail with significant improvement of symptoms.  We will start Protonix to help manage symptoms.  Given severity of diarrhea will obtain stool studies; patient was unable to give [place] a specimen during clinic visit but will take these home and  return them for evaluation in the near future.  Discussed that symptoms could be related to combination of factors including alcohol/NSAID use as well as infection.  Recommended that she eat a bland diet and drink plenty of fluids.  She was encouraged to limit alcohol consumption and avoid NSAIDs.  Discussed that if symptoms are not improving and we are unable to identify etiology of symptoms on work-up she should follow-up with GI specialist and was given contact information for local provider with instruction to call to schedule an appointment.  Discussed that we do not have imaging capabilities in urgent care and if she has any worsening symptoms including severe abdominal pain, fever, nausea/vomiting interfering with oral intake, melena, hematochezia, hematemesis she needs to go to the emergency room immediately to which she expressed understanding.  Final Clinical Impressions(s) / UC Diagnoses   Final diagnoses:  Generalized abdominal pain  Diarrhea, unspecified type  Nausea and vomiting, unspecified vomiting type  History of HIV infection [facility])     Discharge Instructions      Start Protonix daily to help with your symptoms.  Please return the stool specimen as soon as you can.  We will contact you if this is shows there is an infection we need to change your treatment plan.  Follow-up with GI; call to schedule an appointment as soon as possible.  As we discussed, if anything worsens and you have severe abdominal pain, blood in your stool, nausea/vomiting including blood in your vomit you need to go to the emergency room immediately.     ED Prescriptions     Medication Sig Dispense Auth. Provider   pantoprazole (PROTONIX) 40 MG tablet Take 1 tablet (40 mg total) by mouth daily. 30 tablet Auna Mikkelsen, Derry Skill, PA-C      PDMP not reviewed this encounter.   Terrilee Croak, PA-C 03/26/22 1140

## 2022-03-26 NOTE — Discharge Instructions (Signed)
Start Protonix daily to help with your symptoms.  Please return the stool specimen as soon as you can.  We will contact you if this is shows there is an infection we need to change your treatment plan.  Follow-up with GI; call to schedule an appointment as soon as possible.  As we discussed, if anything worsens and you have severe abdominal pain, blood in your stool, nausea/vomiting including blood in your vomit you need to go to the emergency room immediately.

## 2022-03-26 NOTE — ED Triage Notes (Signed)
Pt is present today with ingestion, vomiting and diarrhea. Pt sx started one week ago

## 2022-03-26 NOTE — Telephone Encounter (Signed)
Patient called c/o diarrhea, stated she could not afford her pantoprazole. Spoke with [facility] clinical pharmacist and reviewed Urgent Care note from today.  Explained to pt that one medication she was given in Urgent Care was Maalox/Mylanta. Also reviewed discharge instructions on protonix from Urgent Care note. Per conversation with Estill Bamberg, recommended to patient that she consider a generic OTC PPI if she was unable to afford the pantoprazole. Encouraged patient to discuss options with her preferred pharmacist. Patient verbalized understanding and had no further questions.  Binnie Kand, RN

## 2022-04-24 DIAGNOSIS — Z20822 Contact with and (suspected) exposure to covid-19: Secondary | ICD-10-CM | POA: Diagnosis not present

## 2022-05-01 DIAGNOSIS — Z20822 Contact with and (suspected) exposure to covid-19: Secondary | ICD-10-CM | POA: Diagnosis not present

## 2022-05-14 ENCOUNTER — Encounter: Payer: Self-pay | Admitting: Gastroenterology

## 2022-05-14 ENCOUNTER — Ambulatory Visit (INDEPENDENT_AMBULATORY_CARE_PROVIDER_SITE_OTHER): Payer: BC Managed Care – PPO | Admitting: Gastroenterology

## 2022-05-14 VITALS — BP 100/70 | HR 86 | Ht 59.0 in | Wt 122.0 lb

## 2022-05-14 DIAGNOSIS — Z1211 Encounter for screening for malignant neoplasm of colon: Secondary | ICD-10-CM

## 2022-05-14 DIAGNOSIS — R194 Change in bowel habit: Secondary | ICD-10-CM | POA: Diagnosis not present

## 2022-05-14 DIAGNOSIS — K219 Gastro-esophageal reflux disease without esophagitis: Secondary | ICD-10-CM

## 2022-05-14 MED ORDER — SUTAB 1479-225-188 MG PO TABS: 1.0000 | ORAL_TABLET | ORAL | 0 refills | Status: DC

## 2022-05-14 MED ORDER — OMEPRAZOLE 20 MG PO CPDR: 20.0000 mg | DELAYED_RELEASE_CAPSULE | Freq: Every day | ORAL | 3 refills | Status: DC

## 2022-05-14 NOTE — Patient Instructions (Addendum)
If you are age 53 or older, your body mass index should be between 23-30. Your Body mass index is 24.64 kg/m. If this is out of the aforementioned range listed, please consider follow up with your Primary Care Provider.  If you are age 21 or younger, your body mass index should be between 19-25. Your Body mass index is 24.64 kg/m. If this is out of the aformentioned range listed, please consider follow up with your Primary Care Provider.   ________________________________________________________  The [place] [facility] providers would like to encourage you to use [facility] to communicate with providers for non-urgent requests or questions.  Due to long hold times on the telephone, sending your provider a message by [facility] may be a faster and more efficient way to get a response.  Please allow 48 business hours for a response.  Please remember that this is for non-urgent requests.  _______________________________________________________  Dennis Bast have been scheduled for a colonoscopy. Please follow written instructions given to you at your visit today.  Please pick up your prep supplies at the pharmacy within the next 1-3 days. If you use inhalers (even only as needed), please bring them with you on the day of your procedure.  We have sent the following medications to your pharmacy for you to pick up at your convenience: Omeprazole 20 mg : Take once daily  Thank you for entrusting me with your care and for choosing  HealthCare, Dr. Sunflower Cellar

## 2022-05-14 NOTE — Progress Notes (Signed)
HPI :  51 year old female with a history of HIV, hypertension, GERD, here for new patient evaluation for altered bowel habits and colon cancer screening, referred by Dr. Karle Plumber.  The patient states at baseline she normally has regular bowel habits, no blood in her stool.  About 1 month ago she had severe diarrhea that lasted for about 2 weeks.  She states she had frequent urgent loose stools during this time.  Over the course of 2 weeks this stopped on her own and she has since had normal bowel habits.  She denies any abdominal pains, bowels have completely reverted to normal.  She otherwise has a history of reflux, mostly pyrosis/burning in her chest.  She has been on omeprazole 20 mg daily and that works pretty well to control her symptoms.  When she ran out of her prescription her symptoms recurred and bothering her.  She eats a lot of spicy foods, generally is able to eat what she wants on the medication.  She denies ever having prior colonoscopy before or family history of colon cancer.  No prior EGD.  Denies any cardiopulmonary symptoms, denies any problems with anesthesia in the past.  Otherwise feels well without any complaints today.  HIV well-controlled per patient.      Past Medical History:  Diagnosis Date   Acid reflux    Elevated serum creatinine 10/05/2017   HIV (human immunodeficiency virus infection) ([place])    Hypertension      Past Surgical History:  Procedure Laterality Date   COLPOSCOPY  09/20/2020       I & D EXTREMITY Right 08/30/2017   Procedure: IRRIGATION AND DEBRIDEMENT INDEX FINGER;  Surgeon: Netta Cedars, MD;  Location: [facility];  Service: Orthopedics;  Laterality: Right;   Family History  Problem Relation Age of Onset   Hypertension Mother    Diabetes Mother    Lung cancer Maternal Grandmother    Lung cancer Maternal Uncle    Social History   Tobacco Use   Smoking status: Every Day    Packs/day: 0.50    Types: Cigarettes   Smokeless  tobacco: Never  Vaping Use   Vaping Use: Never used  Substance Use Topics   Alcohol use: Yes    Alcohol/week: 15.0 standard drinks of alcohol    Types: 15 Cans of beer per week   Drug use: No   Current Outpatient Medications  Medication Sig Dispense Refill   acetaminophen (TYLENOL) 500 MG tablet Take 1,000 mg by mouth every 6 (six) hours as needed for moderate pain.      albuterol (PROVENTIL HFA;VENTOLIN HFA) 108 (90 Base) MCG/ACT inhaler Inhale 2 puffs into the lungs every 6 (six) hours as needed for wheezing or shortness of breath. 54 g 3   bictegravir-emtricitabine-tenofovir AF (BIKTARVY) 50-200-25 MG TABS tablet Take 1 tablet by mouth daily 30 tablet 5   Ensure (ENSURE) Take 237 mLs by mouth 2 (two) times daily between meals. 237 mL 6   omeprazole (PRILOSEC) 20 MG capsule Take 1 capsule (20 mg total) by mouth daily. 90 capsule 3   sertraline (ZOLOFT) 50 MG tablet TAKE 1/2 TABLET BY MOUTH DAILY FOR 2 WEEKS, THEN TAKE 1 TABLET DAILY 90 tablet 1   Sodium Sulfate-Mag Sulfate-KCl (SUTAB) [phone] MG TABS Take 1 kit by mouth as directed. BIN: [id number] PCN: CN GROUP: [id number] MEMBER ID: [id number]; RUN AS CASH 24 tablet 0   No current facility-administered medications for this visit.   Allergies  Allergen Reactions  Ampicillin Swelling and Rash   Penicillins Swelling and Rash   Shrimp [Shellfish Allergy] Swelling   Codeine Itching   Hydrocodone Itching     Review of Systems: All systems reviewed and negative except where noted in HPI.   Lab Results  Component Value Date   WBC 8.3 03/26/2022   HGB 13.3 03/26/2022   HCT 41.2 03/26/2022   MCV 92.0 03/26/2022   PLT 321 03/26/2022    Lab Results  Component Value Date   CREATININE 1.06 (H) 03/26/2022   BUN 9 03/26/2022   NA 137 03/26/2022   K 3.4 (L) 03/26/2022   CL 102 03/26/2022   CO2 24 03/26/2022    Lab Results  Component Value Date   ALT 19 03/26/2022   AST 24 03/26/2022   ALKPHOS 124 03/26/2022   BILITOT  0.5 03/26/2022     Physical Exam: BP 100/70   Pulse 86   Ht '4\' 11"'  (1.499 m)   Wt 122 lb (55.3 kg)   BMI 24.64 kg/m  Constitutional: Pleasant,well-developed, female in no acute distress. HEENT: Normocephalic and atraumatic. Conjunctivae are normal. No scleral icterus. Neck supple.  Cardiovascular: Normal rate, regular rhythm.  Pulmonary/chest: Effort normal and breath sounds normal. No wheezing, rales or rhonchi. Abdominal: Soft, nondistended, nontender.  There are no masses palpable.  Extremities: no edema Lymphadenopathy: No cervical adenopathy noted. Neurological: Alert and oriented to person place and time. Skin: Skin is warm and dry. No rashes noted. Psychiatric: Normal mood and affect. Behavior is normal.   ASSESSMENT AND PLAN: 51 year old female here for new patient assessment of the following:  Altered bowel habits GERD Colon cancer screening  History as above, significant diarrhea which lasted about 2 weeks and then resolved without intervention.  Suspect she could have had an infectious enteritis, she is been doing much better and has no bowel symptoms at this time or at baseline prior to this.  She has never had a prior colonoscopy for screening purposes, given her age I recommended optical colonoscopy.  We discussed risk benefits of colonoscopy and anesthesia and she wants to proceed.  She will let me know in the interim if she has any other problems with her bowels, fortunately she is back to baseline.  We otherwise reviewed her history of reflux.  Symptoms well controlled on low-dose PPI.  She does not meet criteria for Barrett's screening at this time although will await her course over time.  Given symptoms are well controlled on low-dose PPI, she had significant symptoms when she is off of it, would recommend continue it for now.  She can follow-up with me once yearly for this issue.  Jolly Mango, MD [facility]  CC: Ladell Pier, MD

## 2022-05-16 ENCOUNTER — Telehealth: Payer: Self-pay | Admitting: Gastroenterology

## 2022-05-16 NOTE — Telephone Encounter (Signed)
Inbound call from patient stating insurance does not cover full balance of the SUTAB prep for upcoming procedure on 06/16/22. Patient needs recommendation for a more affordable prep. Please give a call to advise.

## 2022-05-16 NOTE — Telephone Encounter (Signed)
Called pharmacy. Sutab would be $60.  Called patient she needs something less expensive. I let her know I will leave a free Sutab sample on the 3rd floor. She said she will pick it up next week

## 2022-05-19 DIAGNOSIS — Z20822 Contact with and (suspected) exposure to covid-19: Secondary | ICD-10-CM | POA: Diagnosis not present

## 2022-06-09 ENCOUNTER — Encounter: Payer: Self-pay | Admitting: Gastroenterology

## 2022-06-16 ENCOUNTER — Ambulatory Visit (AMBULATORY_SURGERY_CENTER): Payer: BC Managed Care – PPO | Admitting: Gastroenterology

## 2022-06-16 ENCOUNTER — Encounter: Payer: Self-pay | Admitting: Gastroenterology

## 2022-06-16 VITALS — BP 120/72 | HR 73 | Temp 98.4°F | Resp 12 | Ht 59.0 in | Wt 122.0 lb

## 2022-06-16 DIAGNOSIS — D122 Benign neoplasm of ascending colon: Secondary | ICD-10-CM | POA: Diagnosis not present

## 2022-06-16 DIAGNOSIS — R197 Diarrhea, unspecified: Secondary | ICD-10-CM | POA: Diagnosis not present

## 2022-06-16 DIAGNOSIS — D125 Benign neoplasm of sigmoid colon: Secondary | ICD-10-CM

## 2022-06-16 DIAGNOSIS — R194 Change in bowel habit: Secondary | ICD-10-CM

## 2022-06-16 DIAGNOSIS — Z1211 Encounter for screening for malignant neoplasm of colon: Secondary | ICD-10-CM

## 2022-06-16 MED ORDER — SODIUM CHLORIDE 0.9 % IV SOLN: 500.0000 mL | INTRAVENOUS | Status: DC

## 2022-06-16 NOTE — Op Note (Signed)
[facility] Patient Name: Michelle Waters Procedure Date: 06/16/2022 12:03 PM MRN: [id number] Endoscopist: Remo Lipps P. Havery Moros , MD Age: 51 Referring MD:  Date of Birth: Jul 21, 1971 Gender: Female Account #: [id number] Procedure:                Colonoscopy Indications:              Screening for colorectal malignant neoplasm, This                            is the patient's first colonoscopy, history of                            altered bowel habits since improved over time                            however having intermittent loose stools                            occasionally Medicines:                Monitored Anesthesia Care Procedure:                Pre-Anesthesia Assessment:                           - Prior to the procedure, a History and Physical                            was performed, and patient medications and                            allergies were reviewed. The patient's tolerance of                            previous anesthesia was also reviewed. The risks                            and benefits of the procedure and the sedation                            options and risks were discussed with the patient.                            All questions were answered, and informed consent                            was obtained. Prior Anticoagulants: The patient has                            taken no previous anticoagulant or antiplatelet                            agents. ASA Grade Assessment: II - A patient with  mild systemic disease. After reviewing the risks                            and benefits, the patient was deemed in                            satisfactory condition to undergo the procedure.                           After obtaining informed consent, the colonoscope                            was passed under direct vision. Throughout the                            procedure, the patient's blood pressure, pulse, and                             oxygen saturations were monitored continuously. The                            Olympus [id number] Colonoscope was                            introduced through the anus and advanced to the the                            terminal ileum, with identification of the                            appendiceal orifice and IC valve. The colonoscopy                            was performed without difficulty. The patient                            tolerated the procedure well. The quality of the                            bowel preparation was adequate. The terminal ileum,                            ileocecal valve, appendiceal orifice, and rectum                            were photographed. Scope In: 12:07:21 PM Scope Out: 12:25:04 PM Scope Withdrawal Time: 0 hours 13 minutes 33 seconds  Total Procedure Duration: 0 hours 17 minutes 43 seconds  Findings:                 The perianal and digital rectal examinations were                            normal.  The terminal ileum appeared normal.                           A 4 mm polyp was found in the ascending colon. The                            polyp was sessile. The polyp was removed with a                            cold snare. Resection and retrieval were complete.                           Four sessile polyps were found in the sigmoid                            colon. The polyps were 3 to 5 mm in size. These                            polyps were removed with a cold snare. Resection                            and retrieval were complete.                           Internal hemorrhoids were found during                            retroflexion. The hemorrhoids were small.                           The exam was otherwise without abnormality.                           Biopsies for histology were taken with a cold                            forceps from the right colon, left colon and                             transverse colon for evaluation of microscopic                            colitis. Complications:            No immediate complications. Estimated blood loss:                            Minimal. Estimated Blood Loss:     Estimated blood loss was minimal. Impression:               - The examined portion of the ileum was normal.                           - One 4 mm polyp in the ascending colon, removed  with a cold snare. Resected and retrieved.                           - Four 3 to 5 mm polyps in the sigmoid colon,                            removed with a cold snare. Resected and retrieved.                           - Internal hemorrhoids.                           - The examination was otherwise normal.                           - Biopsies were taken with a cold forceps from the                            right colon, left colon and transverse colon for                            evaluation of microscopic colitis. Recommendation:           - Patient has a contact number available for                            emergencies. The signs and symptoms of potential                            delayed complications were discussed with the                            patient. Return to normal activities tomorrow.                            Written discharge instructions were provided to the                            patient.                           - Resume previous diet.                           - Continue present medications.                           - Await pathology results. Remo Lipps P. Afia Messenger, MD 06/16/2022 12:31:09 PM This report has been signed electronically.

## 2022-06-16 NOTE — Progress Notes (Signed)
[facility] History and Physical   Primary Care Physician:  Ladell Pier, MD   Reason for Procedure:  Screening, altered bowel habits - improved  Plan:    colonoscopy     HPI: Michelle Waters is a 51 y.o. female  here for altered bowel habits and screening colonoscopy. No family history of colon cancer known. Otherwise feels well without any cardiopulmonary symptoms. Seen on 05/14/22 - no interval changes. Still having some intermittent loose stools but significantly improved from prior clinic visit.  I have discussed risks / benefits of anesthesia and endoscopic procedure with [facility] and they wish to proceed with the exams as outlined today.    Past Medical History:  Diagnosis Date   Acid reflux    Elevated serum creatinine 10/05/2017   HIV (human immunodeficiency virus infection) ([place])    Hypertension     Past Surgical History:  Procedure Laterality Date   COLPOSCOPY  09/20/2020       I & D EXTREMITY Right 08/30/2017   Procedure: IRRIGATION AND DEBRIDEMENT INDEX FINGER;  Surgeon: Netta Cedars, MD;  Location: [facility];  Service: Orthopedics;  Laterality: Right;    Prior to Admission medications   Medication Sig Start Date End Date Taking? Authorizing Provider  acetaminophen (TYLENOL) 500 MG tablet Take 1,000 mg by mouth every 6 (six) hours as needed for moderate pain.    Yes [provider]  bictegravir-emtricitabine-tenofovir AF (BIKTARVY) 50-200-25 MG TABS tablet Take 1 tablet by mouth daily 02/04/22  Yes Calone, Ples Specter, FNP  Ensure (ENSURE) Take 237 mLs by mouth 2 (two) times daily between meals. 12/24/21  Yes Golden Circle, FNP  omeprazole (PRILOSEC) 20 MG capsule Take 1 capsule (20 mg total) by mouth daily. 05/14/22  Yes Suheyla Mortellaro, Carlota Raspberry, MD  albuterol (PROVENTIL HFA;VENTOLIN HFA) 108 (90 Base) MCG/ACT inhaler Inhale 2 puffs into the lungs every 6 (six) hours as needed for wheezing or shortness of breath. 11/21/15   Tresa Garter, MD  sertraline (ZOLOFT) 50 MG tablet TAKE 1/2 TABLET BY MOUTH DAILY FOR 2 WEEKS, THEN TAKE 1 TABLET DAILY Patient not taking: Reported on 06/16/2022 04/22/20   Ladell Pier, MD    Current Outpatient Medications  Medication Sig Dispense Refill   acetaminophen (TYLENOL) 500 MG tablet Take 1,000 mg by mouth every 6 (six) hours as needed for moderate pain.      bictegravir-emtricitabine-tenofovir AF (BIKTARVY) 50-200-25 MG TABS tablet Take 1 tablet by mouth daily 30 tablet 5   Ensure (ENSURE) Take 237 mLs by mouth 2 (two) times daily between meals. 237 mL 6   omeprazole (PRILOSEC) 20 MG capsule Take 1 capsule (20 mg total) by mouth daily. 90 capsule 3   albuterol (PROVENTIL HFA;VENTOLIN HFA) 108 (90 Base) MCG/ACT inhaler Inhale 2 puffs into the lungs every 6 (six) hours as needed for wheezing or shortness of breath. 54 g 3   sertraline (ZOLOFT) 50 MG tablet TAKE 1/2 TABLET BY MOUTH DAILY FOR 2 WEEKS, THEN TAKE 1 TABLET DAILY (Patient not taking: Reported on 06/16/2022) 90 tablet 1   Current Facility-Administered Medications  Medication Dose Route Frequency Provider Last Rate Last Admin   0.9 %  sodium chloride infusion  500 mL Intravenous Continuous Ra Pfiester, Carlota Raspberry, MD        Allergies as of 06/16/2022 - Review Complete 06/16/2022  Allergen Reaction Noted   Ampicillin Swelling and Rash 02/21/2013   Penicillins Swelling and Rash 02/21/2013   Shrimp [shellfish allergy] Swelling  08/31/2017   Codeine Itching 11/05/2013   Hydrocodone Itching 11/05/2013    Family History  Problem Relation Age of Onset   Hypertension Mother    Diabetes Mother    Lung cancer Maternal Uncle    Lung cancer Maternal Grandmother    Rectal cancer Neg Hx    Stomach cancer Neg Hx     Social History   Socioeconomic History   Marital status: Married    Spouse name: Not on file   Number of children: Not on file   Years of education: Not on file   Highest education level: Not on file   Occupational History   Not on file  Tobacco Use   Smoking status: Every Day    Packs/day: 0.50    Types: Cigarettes   Smokeless tobacco: Never  Vaping Use   Vaping Use: Never used  Substance and Sexual Activity   Alcohol use: Yes    Alcohol/week: 15.0 standard drinks of alcohol    Types: 15 Cans of beer per week   Drug use: No   Sexual activity: Yes    Partners: Male    Birth control/protection: None    Comment: declined condoms 01/2021  Other Topics Concern   Not on file  Social History Narrative   Not on file   Social Determinants of Health   Financial Resource Strain: Not on file  Food Insecurity: Food Insecurity Present (09/20/2020)   Hunger Vital Sign    Worried About Running Out of Food in the Last Year: Sometimes true    Ran Out of Food in the Last Year: Sometimes true  Transportation Needs: Unmet Transportation Needs (09/20/2020)   PRAPARE - Hydrologist (Medical): Yes    Lack of Transportation (Non-Medical): Yes  Physical Activity: Not on file  Stress: Not on file  Social Connections: Not on file  Intimate Partner Violence: Not on file    Review of Systems: All other review of systems negative except as mentioned in the HPI.  Physical Exam: Vital signs BP 121/65   Pulse 71   Temp 98.4 F (36.9 C)   Ht '4\' 11"'$  (1.499 m)   Wt 122 lb (55.3 kg)   SpO2 100%   BMI 24.64 kg/m   General:   Alert,  Well-developed, pleasant and cooperative in NAD Lungs:  Clear throughout to auscultation.   Heart:  Regular rate and rhythm Abdomen:  Soft, nontender and nondistended.   Neuro/Psych:  Alert and cooperative. Normal mood and affect. A and O x 3  Jolly Mango, MD [facility]

## 2022-06-16 NOTE — Progress Notes (Signed)
Report to pacu rn. Vss. Care resumed by rn. 

## 2022-06-16 NOTE — Progress Notes (Signed)
Called to room to assist during endoscopic procedure.  Patient ID and intended procedure confirmed with present staff. Received instructions for my participation in the procedure from the performing physician.  

## 2022-06-16 NOTE — Patient Instructions (Signed)
Information on polyps and hemorrhoids given to you today.  Await pathology results.  Resume previous diet and medications.   YOU HAD AN ENDOSCOPIC PROCEDURE TODAY AT [facility]:   Refer to the procedure report that was given to you for any specific questions about what was found during the examination.  If the procedure report does not answer your questions, please call your gastroenterologist to clarify.  If you requested that your care partner not be given the details of your procedure findings, then the procedure report has been included in a sealed envelope for you to review at your convenience later.  YOU SHOULD EXPECT: Some feelings of bloating in the abdomen. Passage of more gas than usual.  Walking can help get rid of the air that was put into your GI tract during the procedure and reduce the bloating. If you had a lower endoscopy (such as a colonoscopy or flexible sigmoidoscopy) you may notice spotting of blood in your stool or on the toilet paper. If you underwent a bowel prep for your procedure, you may not have a normal bowel movement for a few days.  Please Note:  You might notice some irritation and congestion in your nose or some drainage.  This is from the oxygen used during your procedure.  There is no need for concern and it should clear up in a day or so.  SYMPTOMS TO REPORT IMMEDIATELY:  Following lower endoscopy (colonoscopy or flexible sigmoidoscopy):  Excessive amounts of blood in the stool  Significant tenderness or worsening of abdominal pains  Swelling of the abdomen that is new, acute  Fever of 100F or higher   For urgent or emergent issues, a gastroenterologist can be reached at any hour by calling [phone]. Do not use MyChart messaging for urgent concerns.    DIET:  We do recommend a small meal at first, but then you may proceed to your regular diet.  Drink plenty of fluids but you should avoid alcoholic beverages for 24  hours.  ACTIVITY:  You should plan to take it easy for the rest of today and you should NOT DRIVE or use heavy machinery until tomorrow (because of the sedation medicines used during the test).    FOLLOW UP: Our staff will call the number listed on your records the next business day following your procedure.  We will call around 7:15- 8:00 am to check on you and address any questions or concerns that you may have regarding the information given to you following your procedure. If we do not reach you, we will leave a message.  If you develop any symptoms (ie: fever, flu-like symptoms, shortness of breath, cough etc.) before then, please call [phone].  If you test positive for Covid 19 in the 2 weeks post procedure, please call and report this information to [place].    If any biopsies were taken you will be contacted by phone or by letter within the next 1-3 weeks.  Please call us at [phone] if you have not heard about the biopsies in 3 weeks.    SIGNATURES/CONFIDENTIALITY: You and/or your care partner have signed paperwork which will be entered into your electronic medical record.  These signatures attest to the fact that that the information above on your After Visit Summary has been reviewed and is understood.  Full responsibility of the confidentiality of this discharge information lies with you and/or your care-partner.

## 2022-06-17 ENCOUNTER — Telehealth: Payer: Self-pay | Admitting: *Deleted

## 2022-06-17 NOTE — Telephone Encounter (Signed)
Attempt for follow up phone call. Person who answered stated that I had the wrong number. Number rechecked and I dialed what was on the paper.

## 2022-06-18 ENCOUNTER — Other Ambulatory Visit: Payer: Self-pay

## 2022-06-18 ENCOUNTER — Encounter: Payer: Self-pay | Admitting: Gastroenterology

## 2022-06-18 DIAGNOSIS — E441 Mild protein-calorie malnutrition: Secondary | ICD-10-CM

## 2022-06-18 MED ORDER — ENSURE PO LIQD: 237.0000 mL | Freq: Two times a day (BID) | ORAL | 6 refills | Status: DC

## 2022-07-01 DIAGNOSIS — Z20822 Contact with and (suspected) exposure to covid-19: Secondary | ICD-10-CM | POA: Diagnosis not present

## 2022-07-28 NOTE — Progress Notes (Unsigned)
Brief Narrative   Patient ID: Michelle Waters, female    DOB: 04-14-1971, 51 y.o.   MRN: [id number]  Michelle Waters is a 51 year old African-American female diagnosed with HIV in November 2018 with risk factor being heterosexual contact. CD4 nadir of 1080 and viral load of 472.  Genotype with K103R/V179D with resistance to Efavirinez and Nevirapine. No history of opportunistic infection. ART history has been Biktarvy  Subjective:    No chief complaint on file.   HPI:  Michelle Waters is a 51 y.o. female with HIV disease last seen on 02/04/2022 with well-controlled virus and good adherence and tolerance to [employer].  Viral load was unable to be completed secondary to being canceled with CD4 count of 766.  RPR titer remains serofast at 1: 2 with previous treatment at 1: 32.  Kidney function, liver function, electrolytes within normal ranges.  Lipid profile with triglycerides 172, HDL 81, and LDL 135. ASCVD low risk at 3.3%. Here today for follow up.    Allergies  Allergen Reactions   Ampicillin Swelling and Rash   Penicillins Swelling and Rash   Shrimp [Shellfish Allergy] Swelling   Codeine Itching   Hydrocodone Itching      Outpatient Medications Prior to Visit  Medication Sig Dispense Refill   acetaminophen (TYLENOL) 500 MG tablet Take 1,000 mg by mouth every 6 (six) hours as needed for moderate pain.      albuterol (PROVENTIL HFA;VENTOLIN HFA) 108 (90 Base) MCG/ACT inhaler Inhale 2 puffs into the lungs every 6 (six) hours as needed for wheezing or shortness of breath. 54 g 3   bictegravir-emtricitabine-tenofovir AF (BIKTARVY) 50-200-25 MG TABS tablet Take 1 tablet by mouth daily 30 tablet 5   Ensure (ENSURE) Take 237 mLs by mouth 2 (two) times daily between meals. 237 mL 6   omeprazole (PRILOSEC) 20 MG capsule Take 1 capsule (20 mg total) by mouth daily. 90 capsule 3   sertraline (ZOLOFT) 50 MG tablet TAKE 1/2 TABLET BY MOUTH DAILY FOR 2 WEEKS, THEN TAKE 1 TABLET DAILY (Patient not  taking: Reported on 06/16/2022) 90 tablet 1   Facility-Administered Medications Prior to Visit  Medication Dose Route Frequency Provider Last Rate Last Admin   0.9 %  sodium chloride infusion  500 mL Intravenous Continuous Armbruster, Carlota Raspberry, MD         Past Medical History:  Diagnosis Date   Acid reflux    Elevated serum creatinine 10/05/2017   HIV (human immunodeficiency virus infection) ([place])    Hypertension      Past Surgical History:  Procedure Laterality Date   COLPOSCOPY  09/20/2020       I & D EXTREMITY Right 08/30/2017   Procedure: IRRIGATION AND DEBRIDEMENT INDEX FINGER;  Surgeon: Netta Cedars, MD;  Location: [facility];  Service: Orthopedics;  Laterality: Right;      Review of Systems  Constitutional:  Negative for appetite change, chills, diaphoresis, fatigue, fever and unexpected weight change.  Eyes:        Negative for acute change in vision  Respiratory:  Negative for chest tightness, shortness of breath and wheezing.   Cardiovascular:  Negative for chest pain.  Gastrointestinal:  Negative for diarrhea, nausea and vomiting.  Genitourinary:  Negative for dysuria, pelvic pain and vaginal discharge.  Musculoskeletal:  Negative for neck pain and neck stiffness.  Skin:  Negative for rash.  Neurological:  Negative for seizures, syncope, weakness and headaches.  Hematological:  Negative for adenopathy. Does not bruise/bleed easily.  Psychiatric/Behavioral:  Negative for hallucinations.       Objective:    There were no vitals taken for this visit. Nursing note and vital signs reviewed.  Physical Exam Constitutional:      General: She is not in acute distress.    Appearance: She is well-developed.  Eyes:     Conjunctiva/sclera: Conjunctivae normal.  Cardiovascular:     Rate and Rhythm: Normal rate and regular rhythm.     Heart sounds: Normal heart sounds. No murmur heard.    No friction rub. No gallop.  Pulmonary:     Effort: Pulmonary effort is normal.  No respiratory distress.     Breath sounds: Normal breath sounds. No wheezing or rales.  Chest:     Chest wall: No tenderness.  Abdominal:     General: Bowel sounds are normal.     Palpations: Abdomen is soft.     Tenderness: There is no abdominal tenderness.  Musculoskeletal:     Cervical back: Neck supple.  Lymphadenopathy:     Cervical: No cervical adenopathy.  Skin:    General: Skin is warm and dry.     Findings: No rash.  Neurological:     Mental Status: She is alert and oriented to person, place, and time.  Psychiatric:        Behavior: Behavior normal.        Thought Content: Thought content normal.        Judgment: Judgment normal.         02/04/2022   10:53 AM 08/22/2021    1:35 PM 08/08/2021   10:09 AM 07/31/2021    3:23 PM 10/16/2020   11:20 AM  Depression screen PHQ 2/9  Decreased Interest 0 0 0 0 1  Down, Depressed, Hopeless 0 1 1 0 1  PHQ - 2 Score 0 1 1 0 2  Altered sleeping     0  Tired, decreased energy     1  Change in appetite     0  Feeling bad or failure about yourself      1  Trouble concentrating     2  Moving slowly or fidgety/restless     2  Suicidal thoughts     0  PHQ-9 Score     8  Difficult doing work/chores     Very difficult       Assessment & Plan:    Patient Active Problem List   Diagnosis Date Noted   GERD without esophagitis 10/16/2020   Cervical dysplasia 09/28/2020   Low grade squamous intraepithelial lesion on cytologic smear of cervix (LGSIL) 04/02/2020   Right foot pain 02/15/2020   Early syphilis, 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   Mild depression 11/11/2019   Mild protein-energy malnutrition ([place]) 11/09/2019   HIV disease ([place]) 10/05/2017   Healthcare maintenance 10/05/2017   Tobacco dependence 01/17/2015   Family history of diabetes mellitus (DM) 01/17/2015     Problem List Items Addressed This Visit   None    I am having Michelle Waters maintain her albuterol, acetaminophen, sertraline, Biktarvy, omeprazole, and  Ensure. We will continue to administer sodium chloride.   No orders of the defined types were placed in this encounter.    Follow-up: No follow-ups on file.   Terri Piedra, MSN, FNP-C Nurse Practitioner [facility] [place] number: [phone]

## 2022-07-29 ENCOUNTER — Other Ambulatory Visit: Payer: Self-pay

## 2022-07-29 ENCOUNTER — Encounter: Payer: Self-pay | Admitting: Family

## 2022-07-29 ENCOUNTER — Ambulatory Visit (INDEPENDENT_AMBULATORY_CARE_PROVIDER_SITE_OTHER): Payer: BC Managed Care – PPO | Admitting: Family

## 2022-07-29 VITALS — Wt 120.0 lb

## 2022-07-29 DIAGNOSIS — B20 Human immunodeficiency virus [HIV] disease: Secondary | ICD-10-CM

## 2022-07-29 DIAGNOSIS — A515 Early syphilis, latent: Secondary | ICD-10-CM | POA: Diagnosis not present

## 2022-07-29 DIAGNOSIS — Z23 Encounter for immunization: Secondary | ICD-10-CM

## 2022-07-29 DIAGNOSIS — Z0000 Encounter for general adult medical examination without abnormal findings: Secondary | ICD-10-CM

## 2022-07-29 MED ORDER — BIKTARVY 50-200-25 MG PO TABS: ORAL_TABLET | ORAL | 5 refills | Status: DC

## 2022-07-29 NOTE — Patient Instructions (Addendum)
Nice to see you.  We will check your lab work today.  Continue to take your medication daily as prescribed.  Refills have been sent to the pharmacy.  Please call [facility] ([facility]) to schedule/follow up on your dental care at [phone]  Plan for follow up in 6 months or sooner if needed with lab work on the same day.   Have a great day and stay safe!  

## 2022-07-29 NOTE — Assessment & Plan Note (Signed)
   Discussed importance of safe sexual practice and condom use.  Condoms and STD testing offered.  Declines vaccinations.  Information for dental clinic provided in after visit summary for routine follow-up.

## 2022-07-29 NOTE — Assessment & Plan Note (Signed)
Ms. Burbage was previously treated for syphilis with titer currently serofast at 1:2. Check RPR.

## 2022-07-29 NOTE — Assessment & Plan Note (Signed)
Tyrianna continues to have well-controlled virus with good adherence and tolerance to [employer].  Reviewed previous lab work and discussed plan of care.  Check blood work today.  Continue current dose of Biktarvy.  Plan for follow-up in 6 months or sooner if needed with lab work on the same day.

## 2022-07-30 LAB — T-HELPER CELL (CD4) - (RCID CLINIC ONLY)
CD4 % Helper T Cell: 54 % (ref 33–65)
CD4 T Cell Abs: 1170 /uL (ref 400–1790)

## 2022-08-01 LAB — HIV-1 RNA QUANT-NO REFLEX-BLD
HIV 1 RNA Quant: NOT DETECTED Copies/mL
HIV-1 RNA Quant, Log: NOT DETECTED Log cps/mL

## 2022-08-01 LAB — COMPREHENSIVE METABOLIC PANEL
AG Ratio: 1.4 (calc) (ref 1.0–2.5)
ALT: 20 U/L (ref 6–29)
AST: 32 U/L (ref 10–35)
Albumin: 4.2 g/dL (ref 3.6–5.1)
Alkaline phosphatase (APISO): 110 U/L (ref 37–153)
BUN: 11 mg/dL (ref 7–25)
CO2: 28 mmol/L (ref 20–32)
Calcium: 9.8 mg/dL (ref 8.6–10.4)
Chloride: 105 mmol/L (ref 98–110)
Creat: 0.88 mg/dL (ref 0.50–1.03)
Globulin: 3.1 g/dL (calc) (ref 1.9–3.7)
Glucose, Bld: 91 mg/dL (ref 65–99)
Potassium: 4.2 mmol/L (ref 3.5–5.3)
Sodium: 140 mmol/L (ref 135–146)
Total Bilirubin: 0.2 mg/dL (ref 0.2–1.2)
Total Protein: 7.3 g/dL (ref 6.1–8.1)

## 2022-08-01 LAB — FLUORESCENT TREPONEMAL AB(FTA)-IGG-BLD: Fluorescent Treponemal ABS: REACTIVE — AB

## 2022-08-01 LAB — RPR: RPR Ser Ql: REACTIVE — AB

## 2022-08-01 LAB — RPR TITER: RPR Titer: 1:1 {titer} — ABNORMAL HIGH

## 2022-08-04 ENCOUNTER — Telehealth: Payer: Self-pay

## 2022-08-04 NOTE — Telephone Encounter (Signed)
Called and spoke with patient. Relayed provider's message verbatim, " lab work looks good. Viral load is undetectable and CD4 count is 1,170. Kidney function, liver function and electrolytes are normal. RPR is stable at 1:1 with no treatment needed". Patient verbalized understanding and had no additional questions.  Binnie Kand, RN

## 2022-08-04 NOTE — Telephone Encounter (Signed)
-----   Message from [place], [place] sent at 08/04/2022 12:29 PM EDT ----- Please inform Michelle Waters that her lab work looks good. Viral load is undetectable and CD4 count is 1,170. Kidney function, liver function and electrolytes are normal. RPR is stable at 1:1 with no treatment needed.

## 2022-08-14 ENCOUNTER — Ambulatory Visit: Payer: BC Managed Care – PPO

## 2022-08-25 ENCOUNTER — Ambulatory Visit: Payer: BC Managed Care – PPO

## 2022-08-28 ENCOUNTER — Ambulatory Visit: Payer: BC Managed Care – PPO

## 2022-09-15 ENCOUNTER — Other Ambulatory Visit: Payer: Self-pay

## 2022-09-15 ENCOUNTER — Ambulatory Visit (INDEPENDENT_AMBULATORY_CARE_PROVIDER_SITE_OTHER): Payer: BC Managed Care – PPO

## 2022-09-15 DIAGNOSIS — Z23 Encounter for immunization: Secondary | ICD-10-CM | POA: Diagnosis not present

## 2022-10-22 ENCOUNTER — Telehealth: Payer: Self-pay

## 2022-10-22 ENCOUNTER — Ambulatory Visit: Payer: Self-pay | Admitting: *Deleted

## 2022-10-22 NOTE — Telephone Encounter (Signed)
Traci called stating she has been unable to get her [employer], spoke with [facility] and refill is not available until 1/17 as it was last dispensed 12/19. She reports she went a few days without medication and started splitting her Biktarvy in half. Advised her to please call us if she is ever low on medication and not to split her pills.   Jeree would also like assistance with her partials as she's having difficulty with them. Provided her with phone number for [facility] to request dental appointment.   She is also complaining of intermittent shortness of breath during increased activity. Advised her that if shortness of breath worsens or she has difficulty breathing that she should go to the hospital.   Trilby also expresses concerns for needing an eye exam, she reports leg pain and numbness, and swollen lymph nodes that she believes are due to smoking cigarettes. Encouraged her to follow up with her PCP for her concerns and further evaluation and provided her with their office phone number.   Beryle Flock, RN

## 2022-10-22 NOTE — Telephone Encounter (Signed)
Reason for Disposition  [1] MODERATE pain (e.g., interferes with normal activities, limping) AND [2] present > 3 days  Answer Assessment - Initial Assessment Questions 1. ONSET: "When did the pain start?"      Right leg is hurting all the way down.  My right arm I can't move above my head.   I need to get my SSI.   I need to see Dr. Karle Plumber. 2. LOCATION: "Where is the pain located?"      Multiple complaints.  3. PAIN: "How bad is the pain?"    (Scale 1-10; or mild, moderate, severe)   -  MILD (1-3): doesn't interfere with normal activities    -  MODERATE (4-7): interferes with normal activities (e.g., work or school) or awakens from sleep, limping    -  SEVERE (8-10): excruciating pain, unable to do any normal activities, unable to walk     Right leg hurts 4. WORK OR EXERCISE: "Has there been any recent work or exercise that involved this part of the body?"      No 5. CAUSE: "What do you think is causing the leg pain?"     Bursitis and a bunch of problems 6. OTHER SYMPTOMS: "Do you have any other symptoms?" (e.g., chest pain, back pain, breathing difficulty, swelling, rash, fever, numbness, weakness)     Multiple problems   Needing to get SSI 7. PREGNANCY: "Is there any chance you are pregnant?" "When was your last menstrual period?"     N/A due to age  Protocols used: Leg Pain-A-AH

## 2022-10-22 NOTE — Telephone Encounter (Signed)
  Chief Complaint: Needing to see Dr. Wynetta Emery about getting her SSI.   "They told me I need to see my primary doctor".    Also c/o right leg pain along with multiple physical complaints. Symptoms: Right leg pain from hip down to foot.   "I think it's my bursitis".    She listed off a long list of disorders that she has that need to be checked. Frequency: All the time Pertinent Negatives: Patient denies N/A Disposition: '[]'$ ED /'[]'$ Urgent Care (no appt availability in office) / '[x]'$ Appointment(In office/virtual)/ '[]'$  [facility]/ '[]'$ Home Care/ '[]'$ Refused Recommended Disposition /'[]'$ [facility]/ '[]'$  Follow-up with PCP Additional Notes: Appt. Made with Dr. Karle Plumber for 12/29/2022 at 1:30.

## 2022-12-02 ENCOUNTER — Other Ambulatory Visit: Payer: Self-pay

## 2022-12-02 DIAGNOSIS — E441 Mild protein-calorie malnutrition: Secondary | ICD-10-CM

## 2022-12-02 MED ORDER — ENSURE PO LIQD: 237.0000 mL | Freq: Two times a day (BID) | ORAL | 6 refills | Status: DC

## 2022-12-29 ENCOUNTER — Encounter: Payer: Self-pay | Admitting: Internal Medicine

## 2022-12-29 ENCOUNTER — Ambulatory Visit: Payer: Medicaid Other | Attending: Internal Medicine | Admitting: Internal Medicine

## 2022-12-29 VITALS — BP 125/83 | HR 76 | Temp 98.5°F | Ht 59.0 in | Wt 124.0 lb

## 2022-12-29 DIAGNOSIS — R030 Elevated blood-pressure reading, without diagnosis of hypertension: Secondary | ICD-10-CM | POA: Diagnosis not present

## 2022-12-29 DIAGNOSIS — F17200 Nicotine dependence, unspecified, uncomplicated: Secondary | ICD-10-CM

## 2022-12-29 DIAGNOSIS — B20 Human immunodeficiency virus [HIV] disease: Secondary | ICD-10-CM

## 2022-12-29 DIAGNOSIS — Z1231 Encounter for screening mammogram for malignant neoplasm of breast: Secondary | ICD-10-CM

## 2022-12-29 DIAGNOSIS — M25551 Pain in right hip: Secondary | ICD-10-CM

## 2022-12-29 DIAGNOSIS — F17210 Nicotine dependence, cigarettes, uncomplicated: Secondary | ICD-10-CM

## 2022-12-29 DIAGNOSIS — Z23 Encounter for immunization: Secondary | ICD-10-CM | POA: Diagnosis not present

## 2022-12-29 DIAGNOSIS — Z8742 Personal history of other diseases of the female genital tract: Secondary | ICD-10-CM

## 2022-12-29 MED ORDER — MELOXICAM 15 MG PO TABS: 15.0000 mg | ORAL_TABLET | Freq: Every day | ORAL | 2 refills | Status: DC

## 2022-12-29 NOTE — Patient Instructions (Signed)
You can go to [facility] imaging [place] [employer]. to have the x-ray done on your right hip.  I have sent a prescription to your pharmacy for the medication called meloxicam for the pain that you are having in the hip.  You will be called for an appointment for your mammogram.

## 2022-12-29 NOTE — Progress Notes (Signed)
Patient ID: Michelle Waters, female    DOB: 18-May-1971  MRN: [id number]  CC: Leg Pain (R leg pain X2 mo, it "gives out", pt reports falls, pain radiates from hip to R foot/Med refill. Michelle Waters received flu vax. Yes to shingles)   Subjective: Michelle Waters is a 52 y.o. female who presents for eval of RT leg pain Her concerns today include:  Pt with hx of HIV, tob dep, depression, syphilis (treated), history of abnormal Pap.     Pt's last visit with me was 07/2021  Pt c/o pain RT hip x few mths. Intermittent; last few days Occurs with standing, sitting or walking.  Hurts to lay on that side as well. Pain goes into groin and down the side of the leg. Can radiate to ankle.  Numbness sometimes in hip.   Sometimes feels like the leg wants to give out.  Tob dep: 1pk last a wk.  Smoked since age 52. Never quit completely.  Not ready to quit.    HIV:  sees ID; last visit 07/2022.  HIV RNA Undetectable. Due for shingrix vaccine.  She is agreeable to receiving the first Shingrix shot today.  Abn PAP: Patient had abnormal Pap in 2019 which showed LSIL with positive HPV.  Repeat Pap 06/2020 again showed the same results.  She saw the gynecologist Michelle Waters 09/2020 and had cervical biopsy which revealed low-grade squamous intraepithelial lesion, CIN-1 and endocervical curettage also showing LGSIL CIN1.  he recommended repeat Pap smear in 1 year and yearly Pap smears going forward.  She does not had a Pap smear since that time.  Patient Active Problem List   Diagnosis Date Noted   GERD without esophagitis 10/16/2020   Cervical dysplasia 09/28/2020   Low grade squamous intraepithelial lesion on cytologic smear of cervix (LGSIL) 04/02/2020   Right foot pain 02/15/2020   Early syphilis, 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   Mild depression 11/11/2019   HIV disease ([place]) 10/05/2017   Healthcare maintenance 10/05/2017   Tobacco dependence 01/17/2015   Family history of diabetes mellitus (DM) 01/17/2015      Current Outpatient Medications on File Prior to Visit  Medication Sig Dispense Refill   acetaminophen (TYLENOL) 500 MG tablet Take 1,000 mg by mouth every 6 (six) hours as needed for moderate pain.      albuterol (PROVENTIL HFA;VENTOLIN HFA) 108 (90 Base) MCG/ACT inhaler Inhale 2 puffs into the lungs every 6 (six) hours as needed for wheezing or shortness of breath. 54 g 3   bictegravir-emtricitabine-tenofovir AF (BIKTARVY) 50-200-25 MG TABS tablet Take 1 tablet by mouth daily 30 tablet 5   Ensure (ENSURE) Take 237 mLs by mouth 2 (two) times daily between meals. 237 mL 6   omeprazole (PRILOSEC) 20 MG capsule Take 1 capsule (20 mg total) by mouth daily. 90 capsule 3   sertraline (ZOLOFT) 50 MG tablet TAKE 1/2 TABLET BY MOUTH DAILY FOR 2 WEEKS, THEN TAKE 1 TABLET DAILY 90 tablet 1   Current Facility-Administered Medications on File Prior to Visit  Medication Dose Route Frequency Provider Last Rate Last Admin   0.9 %  sodium chloride infusion  500 mL Intravenous Continuous Armbruster, Carlota Raspberry, MD        Allergies  Allergen Reactions   Ampicillin Swelling and Rash   Penicillins Swelling and Rash   Shrimp [Shellfish Allergy] Swelling   Codeine Itching   Hydrocodone Itching    Social History   Socioeconomic History   Marital status: Married    Spouse  name: Not on file   Number of children: Not on file   Years of education: Not on file   Highest education level: Not on file  Occupational History   Not on file  Tobacco Use   Smoking status: Every Day    Packs/day: 0.50    Types: Cigarettes   Smokeless tobacco: Never  Vaping Use   Vaping Use: Never used  Substance and Sexual Activity   Alcohol use: Yes    Alcohol/week: 15.0 standard drinks of alcohol    Types: 15 Cans of beer per week   Drug use: No   Sexual activity: Yes    Partners: Male    Birth control/protection: None    Comment: declined condoms 01/2021  Other Topics Concern   Not on file  Social History Narrative    Not on file   Social Determinants of Health   Financial Resource Strain: Not on file  Food Insecurity: Food Insecurity Present (09/20/2020)   Hunger Vital Sign    Worried About Running Out of Food in the Last Year: Sometimes true    Ran Out of Food in the Last Year: Sometimes true  Transportation Needs: Unmet Transportation Needs (09/20/2020)   PRAPARE - Hydrologist (Medical): Yes    Lack of Transportation (Non-Medical): Yes  Physical Activity: Not on file  Stress: Not on file  Social Connections: Not on file  Intimate Partner Violence: Not on file    Family History  Problem Relation Age of Onset   Hypertension Mother    Diabetes Mother    Lung cancer Maternal Uncle    Lung cancer Maternal Grandmother    Rectal cancer Neg Hx    Stomach cancer Neg Hx     Past Surgical History:  Procedure Laterality Date   COLPOSCOPY  09/20/2020       I & D EXTREMITY Right 08/30/2017   Procedure: IRRIGATION AND DEBRIDEMENT INDEX FINGER;  Surgeon: Netta Cedars, MD;  Location: [facility];  Service: Orthopedics;  Laterality: Right;    ROS: Review of Systems Negative except as stated above  PHYSICAL EXAM: BP 125/83 (BP Location: Left Arm, Patient Position: Sitting, Cuff Size: Normal)   Pulse 76   Temp 98.5 F (36.9 C) (Oral)   Ht '4\' 11"'$  (1.499 m)   Wt 124 lb (56.2 kg)   SpO2 100%   BMI 25.04 kg/m   Wt Readings from Last 3 Encounters:  12/29/22 124 lb (56.2 kg)  07/29/22 120 lb (54.4 kg)  06/16/22 122 lb (55.3 kg)    Physical Exam  General appearance -African-American female who looks older than stated age Mental status - normal mood, behavior, speech, dress, motor activity, and thought processes Chest - clear to auscultation, no wheezes, rales or rhonchi, symmetric air entry Heart - normal rate, regular rhythm, normal S1, S2, no murmurs, rubs, clicks or gallops Musculoskeletal -right hip: Mild tenderness on palpation over the trochanteric bursa.  She  has mild discomfort with internal and external rotation.  Good flexion.     Latest Ref Rng & Units 07/29/2022   11:26 AM 03/26/2022   10:56 AM 02/04/2022   11:22 AM  CMP  Glucose 65 - 99 mg/dL 91  96  76   BUN 7 - 25 mg/dL '11  9  6   '$ Creatinine 0.50 - 1.03 mg/dL 0.88  1.06  0.80   Sodium 135 - 146 mmol/L 140  137  141   Potassium 3.5 - 5.3 mmol/L  4.2  3.4  4.0   Chloride 98 - 110 mmol/L 105  102  107   CO2 20 - 32 mmol/L '28  24  25   '$ Calcium 8.6 - 10.4 mg/dL 9.8  10.0  9.5   Total Protein 6.1 - 8.1 g/dL 7.3  8.0  7.2   Total Bilirubin 0.2 - 1.2 mg/dL 0.2  0.5  0.3   Alkaline Phos 38 - 126 U/L  124    AST 10 - 35 U/L 32  24  30   ALT 6 - 29 U/L '20  19  18    '$ Lipid Panel     Component Value Date/Time   CHOL 253 (H) 02/04/2022 1122   TRIG 230 (H) 02/04/2022 1122   HDL 81 02/04/2022 1122   CHOLHDL 3.1 02/04/2022 1122   VLDL 20 11/02/2015 1159   LDLCALC 135 (H) 02/04/2022 1122    CBC    Component Value Date/Time   WBC 8.3 03/26/2022 1056   RBC 4.48 03/26/2022 1056   HGB 13.3 03/26/2022 1056   HGB 12.9 02/04/2022 1117   HCT 41.2 03/26/2022 1056   HCT 39.7 02/04/2022 1117   PLT 321 03/26/2022 1056   PLT 261 02/04/2022 1117   MCV 92.0 03/26/2022 1056   MCV 93 02/04/2022 1117   MCH 29.7 03/26/2022 1056   MCHC 32.3 03/26/2022 1056   RDW 13.2 03/26/2022 1056   RDW 13.4 02/04/2022 1117   LYMPHSABS 1.9 03/26/2022 1056   LYMPHSABS 1.4 02/04/2022 1117   MONOABS 0.6 03/26/2022 1056   EOSABS 0.1 03/26/2022 1056   EOSABS 0.0 02/04/2022 1117   BASOSABS 0.0 03/26/2022 1056   BASOSABS 0.0 02/04/2022 1117    ASSESSMENT AND PLAN: 1. Acute hip pain, right Arthritis versus bursitis.  We will get an x-ray and start her on meloxicam.  Will likely refer her to orthopedics but will wait for results of x-ray first. - meloxicam (MOBIC) 15 MG tablet; Take 1 tablet (15 mg total) by mouth daily.  Dispense: 30 tablet; Refill: 2 - DG Hip Unilat W OR W/O Pelvis Min 4 Views Right; Future  2.  Tobacco dependence Strongly advised to quit.  Discussed health risks associated with smoking.  Patient not willing to give a trial of quitting as yet  3. History of abnormal cervical Pap smear Discussed the importance of following up on this.  She is agreeable to coming back in 4 to 6 weeks to have Pap smear done  4. HIV disease ([place]) Plugged in with care.  Viral load undetectable on Biktarvy  5. Encounter for screening mammogram for malignant neoplasm of breast - MM Digital Screening; Future  6. Need for shingles vaccine First Shingrix vaccine given today.  Advised that the medication can cause some soreness and redness at the injection site.    Patient was given the opportunity to ask questions.  Patient verbalized understanding of the plan and was able to repeat key elements of the plan.   This documentation was completed using Radio producer.  Any transcriptional errors are unintentional.  Orders Placed This Encounter  Procedures   MM Digital Screening   DG Hip Unilat W OR W/O Pelvis Min 4 Views Right   Varicella-zoster vaccine IM     Requested Prescriptions   Signed Prescriptions Disp Refills   meloxicam (MOBIC) 15 MG tablet 30 tablet 2    Sig: Take 1 tablet (15 mg total) by mouth daily.    Return in about 6 weeks (around  02/09/2023) for PAP.  Karle Plumber, MD, FACP

## 2023-01-28 ENCOUNTER — Other Ambulatory Visit: Payer: Self-pay | Admitting: Internal Medicine

## 2023-01-28 DIAGNOSIS — N6489 Other specified disorders of breast: Secondary | ICD-10-CM

## 2023-01-29 ENCOUNTER — Inpatient Hospital Stay: Admission: RE | Admit: 2023-01-29 | Payer: Medicaid Other | Source: Ambulatory Visit

## 2023-02-03 ENCOUNTER — Other Ambulatory Visit: Payer: Self-pay

## 2023-02-03 ENCOUNTER — Ambulatory Visit (INDEPENDENT_AMBULATORY_CARE_PROVIDER_SITE_OTHER): Payer: Medicaid Other | Admitting: Family

## 2023-02-03 ENCOUNTER — Encounter: Payer: Self-pay | Admitting: Family

## 2023-02-03 VITALS — BP 112/76 | HR 87 | Temp 98.3°F | Ht <= 58 in | Wt 120.0 lb

## 2023-02-03 DIAGNOSIS — F17210 Nicotine dependence, cigarettes, uncomplicated: Secondary | ICD-10-CM | POA: Diagnosis not present

## 2023-02-03 DIAGNOSIS — B20 Human immunodeficiency virus [HIV] disease: Secondary | ICD-10-CM

## 2023-02-03 DIAGNOSIS — Z0000 Encounter for general adult medical examination without abnormal findings: Secondary | ICD-10-CM

## 2023-02-03 DIAGNOSIS — F17200 Nicotine dependence, unspecified, uncomplicated: Secondary | ICD-10-CM

## 2023-02-03 DIAGNOSIS — A515 Early syphilis, latent: Secondary | ICD-10-CM

## 2023-02-03 MED ORDER — BIKTARVY 50-200-25 MG PO TABS
ORAL_TABLET | ORAL | 5 refills | Status: DC
Start: 2023-02-03 — End: 2023-04-27

## 2023-02-03 NOTE — Patient Instructions (Signed)
Nice to see you. ? ?We will check your lab work today. ? ?Continue to take your medication daily as prescribed. ? ?Refills have been sent to the pharmacy. ? ?Plan for follow up in 6 months or sooner if needed with lab work on the same day. ? ?Have a great day and stay safe! ? ?

## 2023-02-03 NOTE — Assessment & Plan Note (Signed)
Michelle Waters continues to have well-controlled virus with good adherence and tolerance to [employer].  Reviewed previous lab work and discussed plan of care.  Check lab work.  Continue current dose of Biktarvy.  Plan for follow-up in 6 months or sooner if needed with lab work on the same day.

## 2023-02-03 NOTE — Progress Notes (Signed)
Brief Narrative   Patient ID: Michelle Waters, female    DOB: [birth year], 52 y.o.   MRN: [id number]  Michelle Waters is a 52 year old African-American female diagnosed with HIV in November 2018 with risk factor being heterosexual contact. CD4 nadir of 1080 and viral load of 472.  Genotype with K103R/V179D with resistance to Efavirinez and Nevirapine. No history of opportunistic infection. ART history has been [employer]   Subjective:    Chief Complaint  Patient presents with   Follow-up   HIV Positive/AIDS    HPI:  Michelle Waters is a 52 y.o. female with HIV disease last seen on 07/29/2022 with well-controlled virus and good adherence to [employer].  Viral load was undetectable with CD4 count of 1170.  RPR titer at 1: 1 down from treatment of 1: 32 previously.  Kidney function, liver function, electrolytes within normal ranges.  Here today for routine follow-up.  Michelle Waters continues to take her Biktarvy as prescribed with no adverse side effects and no problems obtaining medication from the pharmacy.  Feeling well today and continues to work with case management.  Potentially looking for a new place to stay as she feels her community around her has gotten worse and safety is slowly becoming an issue.  Condoms and STD testing offered.  Routine healthcare maintenance up-to-date.  Denies fevers, chills, night sweats, headaches, changes in vision, neck pain/stiffness, nausea, diarrhea, vomiting, lesions or rashes.   Allergies  Allergen Reactions   Ampicillin Swelling and Rash   Penicillins Swelling and Rash   Shrimp [Shellfish Allergy] Swelling   Codeine Itching   Hydrocodone Itching      Outpatient Medications Prior to Visit  Medication Sig Dispense Refill   acetaminophen (TYLENOL) 500 MG tablet Take 1,000 mg by mouth every 6 (six) hours as needed for moderate pain.      albuterol (PROVENTIL HFA;VENTOLIN HFA) 108 (90 Base) MCG/ACT inhaler Inhale 2 puffs into the lungs every 6 (six) hours  as needed for wheezing or shortness of breath. 54 g 3   Ensure (ENSURE) Take 237 mLs by mouth 2 (two) times daily between meals. 237 mL 6   meloxicam (MOBIC) 15 MG tablet Take 1 tablet (15 mg total) by mouth daily. 30 tablet 2   omeprazole (PRILOSEC) 20 MG capsule Take 1 capsule (20 mg total) by mouth daily. 90 capsule 3   sertraline (ZOLOFT) 50 MG tablet TAKE 1/2 TABLET BY MOUTH DAILY FOR 2 WEEKS, THEN TAKE 1 TABLET DAILY 90 tablet 1   bictegravir-emtricitabine-tenofovir AF (BIKTARVY) 50-200-25 MG TABS tablet Take 1 tablet by mouth daily 30 tablet 5   Facility-Administered Medications Prior to Visit  Medication Dose Route Frequency Provider Last Rate Last Admin   0.9 %  sodium chloride infusion  500 mL Intravenous Continuous Armbruster, Willaim Rayas, MD         Past Medical History:  Diagnosis Date   Acid reflux    Elevated serum creatinine 10/05/2017   HIV (human immunodeficiency virus infection)    Hypertension      Past Surgical History:  Procedure Laterality Date   COLPOSCOPY  09/20/2020       I & D EXTREMITY Right 08/30/2017   Procedure: IRRIGATION AND DEBRIDEMENT INDEX FINGER;  Surgeon: Beverely Low, MD;  Location: [facility] OR;  Service: Orthopedics;  Laterality: Right;      Review of Systems  Constitutional:  Negative for appetite change, chills, diaphoresis, fatigue, fever and unexpected weight change.  Eyes:  Negative for acute change in vision  Respiratory:  Negative for chest tightness, shortness of breath and wheezing.   Cardiovascular:  Negative for chest pain.  Gastrointestinal:  Negative for diarrhea, nausea and vomiting.  Genitourinary:  Negative for dysuria, pelvic pain and vaginal discharge.  Musculoskeletal:  Negative for neck pain and neck stiffness.  Skin:  Negative for rash.  Neurological:  Negative for seizures, syncope, weakness and headaches.  Hematological:  Negative for adenopathy. Does not bruise/bleed easily.  Psychiatric/Behavioral:  Negative for  hallucinations.       Objective:    BP 112/76   Pulse 87   Temp 98.3 F (36.8 C) (Oral)   Ht  (1.473 m)   Wt 120 lb (54.4 kg)   SpO2 99%   BMI 25.08 kg/m  Nursing note and vital signs reviewed.  Physical Exam Constitutional:      General: She is not in acute distress.    Appearance: She is well-developed.  Eyes:     Conjunctiva/sclera: Conjunctivae normal.  Cardiovascular:     Rate and Rhythm: Normal rate and regular rhythm.     Heart sounds: Normal heart sounds. No murmur heard.    No friction rub. No gallop.  Pulmonary:     Effort: Pulmonary effort is normal. No respiratory distress.     Breath sounds: Normal breath sounds. No wheezing or rales.  Chest:     Chest wall: No tenderness.  Abdominal:     General: Bowel sounds are normal.     Palpations: Abdomen is soft.     Tenderness: There is no abdominal tenderness.  Musculoskeletal:     Cervical back: Neck supple.  Lymphadenopathy:     Cervical: No cervical adenopathy.  Skin:    General: Skin is warm and dry.     Findings: No rash.  Neurological:     Mental Status: She is alert and oriented to person, place, and time.  Psychiatric:        Behavior: Behavior normal.        Thought Content: Thought content normal.        Judgment: Judgment normal.         02/03/2023   10:28 AM 12/29/2022    1:41 PM 02/04/2022   10:53 AM 08/22/2021    1:35 PM 08/08/2021   10:09 AM  Depression screen PHQ 2/9  Decreased Interest 0 3 0 0 0  Down, Depressed, Hopeless 0 3 0 1 1  PHQ - 2 Score 0 6 0 1 1  Altered sleeping  2     Tired, decreased energy  3     Change in appetite  3     Feeling bad or failure about yourself   2     Trouble concentrating  3     Moving slowly or fidgety/restless  2     Suicidal thoughts  0     PHQ-9 Score  21          Assessment & Plan:    Patient Active Problem List   Diagnosis Date Noted   GERD without esophagitis 10/16/2020   Cervical dysplasia 09/28/2020   Low grade squamous  intraepithelial lesion on cytologic smear of cervix (LGSIL) 04/02/2020   Right foot pain 02/15/2020   Early syphilis, 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   Mild depression 11/11/2019   HIV disease 10/05/2017   Healthcare maintenance 10/05/2017   Tobacco dependence 01/17/2015   Family history of diabetes mellitus (DM) 01/17/2015     Problem List Items  Addressed This Visit       Other   Tobacco dependence    Susette Racer continues to smoke tobacco daily.  Discussed importance of tobacco cessation to reduce risk of cardiovascular, respiratory, malignant disease in the future.  She is in the precontemplation stage and not ready to quit at this time.      HIV disease - Primary    Susette Racer continues to have well-controlled virus with good adherence and tolerance to [employer].  Reviewed previous lab work and discussed plan of care.  Check lab work.  Continue current dose of Biktarvy.  Plan for follow-up in 6 months or sooner if needed with lab work on the same day.      Relevant Medications   bictegravir-emtricitabine-tenofovir AF (BIKTARVY) 50-200-25 MG TABS tablet   Other Relevant Orders   COMPLETE METABOLIC PANEL WITH GFR   T-helper cell (CD4)- ([facility] clinic only)   HIV-1 RNA quant-no reflex-bld   Healthcare maintenance    Discussed importance of safe sexual practice and condom use. Condoms and STD testing offered.  Routine vaccinations and healthcare maintenance up-to-date.      Early syphilis, latent    RPR titer down to 1: 1 with previous treatment 1: 32.  No current symptoms.  Check RPR and continue to monitor.      Relevant Medications   bictegravir-emtricitabine-tenofovir AF (BIKTARVY) 50-200-25 MG TABS tablet     I am having Michelle Waters maintain her albuterol, acetaminophen, sertraline, omeprazole, Ensure, meloxicam, and Biktarvy. We will continue to administer sodium chloride.   Meds ordered this encounter  Medications   bictegravir-emtricitabine-tenofovir AF (BIKTARVY)  50-200-25 MG TABS tablet    Sig: Take 1 tablet by mouth daily    Dispense:  30 tablet    Refill:  5    Order Specific Question:   Supervising Provider    Answer:   Judyann Munson [[id number]]     Follow-up: Return in about 6 months (around 08/05/2023).   Marcos Eke, MSN, FNP-C Nurse Practitioner [facility] Main number: [phone]

## 2023-02-03 NOTE — Assessment & Plan Note (Signed)
RPR titer down to 1: 1 with previous treatment 1: 32.  No current symptoms.  Check RPR and continue to monitor.

## 2023-02-03 NOTE — Assessment & Plan Note (Signed)
Michelle Waters continues to smoke tobacco daily.  Discussed importance of tobacco cessation to reduce risk of cardiovascular, respiratory, malignant disease in the future.  She is in the precontemplation stage and not ready to quit at this time.

## 2023-02-03 NOTE — Assessment & Plan Note (Signed)
Discussed importance of safe sexual practice and condom use. Condoms and STD testing offered.  Routine vaccinations and healthcare maintenance up-to-date.

## 2023-02-04 LAB — T-HELPER CELL (CD4) - (RCID CLINIC ONLY)
CD4 % Helper T Cell: 56 % (ref 33–65)
CD4 T Cell Abs: 1012 /uL (ref 400–1790)

## 2023-02-06 LAB — COMPLETE METABOLIC PANEL WITH GFR
AG Ratio: 1.5 (calc) (ref 1.0–2.5)
ALT: 15 U/L (ref 6–29)
AST: 21 U/L (ref 10–35)
Albumin: 4.7 g/dL (ref 3.6–5.1)
Alkaline phosphatase (APISO): 124 U/L (ref 37–153)
BUN/Creatinine Ratio: 13 (calc) (ref 6–22)
BUN: 15 mg/dL (ref 7–25)
CO2: 24 mmol/L (ref 20–32)
Calcium: 9.8 mg/dL (ref 8.6–10.4)
Chloride: 105 mmol/L (ref 98–110)
Creat: 1.2 mg/dL — ABNORMAL HIGH (ref 0.50–1.03)
Globulin: 3.1 g/dL (calc) (ref 1.9–3.7)
Glucose, Bld: 83 mg/dL (ref 65–99)
Potassium: 4.2 mmol/L (ref 3.5–5.3)
Sodium: 138 mmol/L (ref 135–146)
Total Bilirubin: 0.4 mg/dL (ref 0.2–1.2)
Total Protein: 7.8 g/dL (ref 6.1–8.1)
eGFR: 54 mL/min/{1.73_m2} — ABNORMAL LOW (ref >=60)

## 2023-02-06 LAB — HIV-1 RNA QUANT-NO REFLEX-BLD
HIV 1 RNA Quant: NOT DETECTED Copies/mL
HIV-1 RNA Quant, Log: NOT DETECTED Log cps/mL

## 2023-02-09 ENCOUNTER — Telehealth: Payer: Self-pay

## 2023-02-09 NOTE — Telephone Encounter (Signed)
Patient is aware of results and had no further questions. 

## 2023-02-09 NOTE — Telephone Encounter (Signed)
-----   Message from Veryl Speak, FNP sent at 02/09/2023  8:52 AM EDT ----- Please inform Michelle Waters that her viral load is undetectable and CD4 count is 1012. Kidney function is slightly reduced and recommend she ensure she is taking in appropriate amount of water. No major concern at this point and will continue to monitor.

## 2023-02-11 ENCOUNTER — Other Ambulatory Visit: Payer: Medicaid Other

## 2023-02-13 ENCOUNTER — Ambulatory Visit: Payer: Medicaid Other | Admitting: Internal Medicine

## 2023-02-16 ENCOUNTER — Telehealth: Payer: Self-pay | Admitting: Internal Medicine

## 2023-02-16 NOTE — Telephone Encounter (Signed)
Pt is calling in to let Dr. Laural Benes know that her hair is falling out. Pt says when she combs it she notices it comes out more than the usual shedding. Pt doesn't know if it's from nerves, medication, or something else. Please advise.

## 2023-02-16 NOTE — Telephone Encounter (Signed)
Spoke with patient . Verified name & DOB    Patient voiced that she unable to talk right now will call back.

## 2023-02-23 ENCOUNTER — Other Ambulatory Visit: Payer: Medicaid Other

## 2023-04-23 ENCOUNTER — Other Ambulatory Visit: Payer: Self-pay | Admitting: Family

## 2023-04-23 DIAGNOSIS — B20 Human immunodeficiency virus [HIV] disease: Secondary | ICD-10-CM

## 2023-04-27 NOTE — Telephone Encounter (Signed)
Spoke with [facility], they don't have refills on file from the 5 refills that were sent 01/2023.  Sandie Ano, RN

## 2023-05-19 ENCOUNTER — Encounter: Payer: Self-pay | Admitting: Internal Medicine

## 2023-05-19 ENCOUNTER — Ambulatory Visit: Payer: Medicaid Other | Attending: Internal Medicine | Admitting: Internal Medicine

## 2023-05-19 ENCOUNTER — Other Ambulatory Visit (HOSPITAL_COMMUNITY)
Admission: RE | Admit: 2023-05-19 | Discharge: 2023-05-19 | Disposition: A | Discharge status: Home / Self Care | Payer: Medicaid Other | Source: Ambulatory Visit | Attending: Internal Medicine | Admitting: Internal Medicine

## 2023-05-19 VITALS — BP 106/71 | HR 68 | Temp 98.5°F | Ht <= 58 in | Wt 121.0 lb

## 2023-05-19 DIAGNOSIS — F17210 Nicotine dependence, cigarettes, uncomplicated: Secondary | ICD-10-CM

## 2023-05-19 DIAGNOSIS — Z124 Encounter for screening for malignant neoplasm of cervix: Secondary | ICD-10-CM | POA: Diagnosis not present

## 2023-05-19 DIAGNOSIS — Z23 Encounter for immunization: Secondary | ICD-10-CM

## 2023-05-19 NOTE — Progress Notes (Signed)
Patient ID: Michelle Waters, female    DOB: [birth year]  MRN: [id number]  CC: Gynecologic Exam (Pap. Valentino Hue to shingles vax. )   Subjective: Michelle Waters is a 52 y.o. female who presents for pap Her concerns today include:  Pt with hx of HIV, tob dep, depression, syphilis (treated), history of abnormal Pap.     GYN History:  Pt is G0P0 Any hx of abn paps?: Patient had abnormal Pap in 2019 which showed LSIL with positive HPV.  Repeat Pap 06/2020 again showed the same results.  She saw the gynecologist Dr. Vergie Living 09/2020 and had cervical biopsy which revealed low-grade squamous intraepithelial lesion, CIN-1 and endocervical curettage also showing LGSIL CIN1.  he recommended repeat Pap smear in 1 year and yearly Pap smears going forward  Menses regular or irregular?:  postmenopausal How long does menses last? NA Menstrual flow light or heavy?: NA Method of birth control?:  NA Any vaginal dischg at this time?: no Dysuria?: no Any hx of STI?: HIV, nothing else Sexually active with how many partners:  not active in past 1 yr Desires STI screen: no Last MMG:  MMG scheduled for 05/2023 Family hx of uterine, cervical or breast cancer?:  don't know   Patient Active Problem List   Diagnosis Date Noted   GERD without esophagitis 10/16/2020   Cervical dysplasia 09/28/2020   Low grade squamous intraepithelial lesion on cytologic smear of cervix (LGSIL) 04/02/2020   Right foot pain 02/15/2020   Early syphilis, 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   Mild depression 11/11/2019   HIV disease (HCC) 10/05/2017   Healthcare maintenance 10/05/2017   Tobacco dependence 01/17/2015   Family history of diabetes mellitus (DM) 01/17/2015     Current Outpatient Medications on File Prior to Visit  Medication Sig Dispense Refill   acetaminophen (TYLENOL) 500 MG tablet Take 1,000 mg by mouth every 6 (six) hours as needed for moderate pain.      albuterol (PROVENTIL HFA;VENTOLIN HFA) 108 (90 Base) MCG/ACT inhaler Inhale 2  puffs into the lungs every 6 (six) hours as needed for wheezing or shortness of breath. 54 g 3   bictegravir-emtricitabine-tenofovir AF (BIKTARVY) 50-200-25 MG TABS tablet TAKE 1 TABLET BY MOUTH DAILY 30 tablet 3   Ensure (ENSURE) Take 237 mLs by mouth 2 (two) times daily between meals. 237 mL 6   meloxicam (MOBIC) 15 MG tablet Take 1 tablet (15 mg total) by mouth daily. 30 tablet 2   omeprazole (PRILOSEC) 20 MG capsule Take 1 capsule (20 mg total) by mouth daily. 90 capsule 3   sertraline (ZOLOFT) 50 MG tablet TAKE 1/2 TABLET BY MOUTH DAILY FOR 2 WEEKS, THEN TAKE 1 TABLET DAILY 90 tablet 1   Current Facility-Administered Medications on File Prior to Visit  Medication Dose Route Frequency Provider Last Rate Last Admin   0.9 %  sodium chloride infusion  500 mL Intravenous Continuous Armbruster, Willaim Rayas, MD        Allergies  Allergen Reactions   Ampicillin Swelling and Rash   Penicillins Swelling and Rash   Shrimp [Shellfish Allergy] Swelling   Codeine Itching   Hydrocodone Itching    Social History   Socioeconomic History   Marital status: Married    Spouse name: Not on file   Number of children: Not on file   Years of education: Not on file   Highest education level: Not on file  Occupational History   Not on file  Tobacco Use   Smoking status: Every Day  Current packs/day: 0.50    Types: Cigarettes   Smokeless tobacco: Never  Vaping Use   Vaping status: Never Used  Substance and Sexual Activity   Alcohol use: Yes    Alcohol/week: 15.0 standard drinks of alcohol    Types: 15 Cans of beer per week   Drug use: No   Sexual activity: Yes    Partners: Male    Birth control/protection: None    Comment: declined condoms 01/2021  Other Topics Concern   Not on file  Social History Narrative   Not on file   Social Determinants of Health   Financial Resource Strain: Not on file  Food Insecurity: Food Insecurity Present (09/20/2020)   Hunger Vital Sign    Worried About  Running Out of Food in the Last Year: Sometimes true    Ran Out of Food in the Last Year: Sometimes true  Transportation Needs: Unmet Transportation Needs (09/20/2020)   PRAPARE - Administrator, Civil Service (Medical): Yes    Lack of Transportation (Non-Medical): Yes  Physical Activity: Not on file  Stress: Not on file  Social Connections: Not on file  Intimate Partner Violence: Not on file    Family History  Problem Relation Age of Onset   Hypertension Mother    Diabetes Mother    Lung cancer Maternal Uncle    Lung cancer Maternal Grandmother    Rectal cancer Neg Hx    Stomach cancer Neg Hx     Past Surgical History:  Procedure Laterality Date   COLPOSCOPY  09/20/2020       I & D EXTREMITY Right 08/30/2017   Procedure: IRRIGATION AND DEBRIDEMENT INDEX FINGER;  Surgeon: Beverely Low, MD;  Location: [facility] OR;  Service: Orthopedics;  Laterality: Right;    ROS: Review of Systems Negative except as stated above  PHYSICAL EXAM: BP 106/71 (BP Location: Left Arm, Patient Position: Sitting, Cuff Size: Normal)   Pulse 68   Temp 98.5 F (36.9 C) (Oral)   Ht 4\' 10"  (1.473 m)   Wt 121 lb (54.9 kg)   SpO2 99%   BMI 25.29 kg/m   Physical Exam  General appearance - alert, well appearing, and in no distress Mental status - normal mood, behavior, speech, dress, motor activity, and thought processes Breasts - CMA Clarrisa present: breasts appear normal, no suspicious masses, no skin or nipple changes or axillary nodes Pelvic - normal external genitalia, vulva, vagina, cervix, uterus and adnexa      Latest Ref Rng & Units 02/03/2023   10:39 AM 07/29/2022   11:26 AM 03/26/2022   10:56 AM  CMP  Glucose 65 - 99 mg/dL 83  91  96   BUN 7 - 25 mg/dL 15  11  9    Creatinine 0.50 - 1.03 mg/dL 1.61  0.96  0.45   Sodium 135 - 146 mmol/L 138  140  137   Potassium 3.5 - 5.3 mmol/L 4.2  4.2  3.4   Chloride 98 - 110 mmol/L 105  105  102   CO2 20 - 32 mmol/L 24  28  24    Calcium  8.6 - 10.4 mg/dL 9.8  9.8  40.9   Total Protein 6.1 - 8.1 g/dL 7.8  7.3  8.0   Total Bilirubin 0.2 - 1.2 mg/dL 0.4  0.2  0.5   Alkaline Phos 38 - 126 U/L   124   AST 10 - 35 U/L 21  32  24   ALT 6 -  29 U/L 15  20  19     Lipid Panel     Component Value Date/Time   CHOL 253 (H) 02/04/2022 1122   TRIG 230 (H) 02/04/2022 1122   HDL 81 02/04/2022 1122   CHOLHDL 3.1 02/04/2022 1122   VLDL 20 11/02/2015 1159   LDLCALC 135 (H) 02/04/2022 1122    CBC    Component Value Date/Time   WBC 8.3 03/26/2022 1056   RBC 4.48 03/26/2022 1056   HGB 13.3 03/26/2022 1056   HGB 12.9 02/04/2022 1117   HCT 41.2 03/26/2022 1056   HCT 39.7 02/04/2022 1117   PLT 321 03/26/2022 1056   PLT 261 02/04/2022 1117   MCV 92.0 03/26/2022 1056   MCV 93 02/04/2022 1117   MCH 29.7 03/26/2022 1056   MCHC 32.3 03/26/2022 1056   RDW 13.2 03/26/2022 1056   RDW 13.4 02/04/2022 1117   LYMPHSABS 1.9 03/26/2022 1056   LYMPHSABS 1.4 02/04/2022 1117   MONOABS 0.6 03/26/2022 1056   EOSABS 0.1 03/26/2022 1056   EOSABS 0.0 02/04/2022 1117   BASOSABS 0.0 03/26/2022 1056   BASOSABS 0.0 02/04/2022 1117    ASSESSMENT AND PLAN: 1. Pap smear for cervical cancer screening - Cytology - PAP  2. Need for shingles vaccine Given second Shingrix vaccine today.    Patient was given the opportunity to ask questions.  Patient verbalized understanding of the plan and was able to repeat key elements of the plan.   This documentation was completed using Paediatric nurse.  Any transcriptional errors are unintentional.  No orders of the defined types were placed in this encounter.    Requested Prescriptions    No prescriptions requested or ordered in this encounter    No follow-ups on file.  Jonah Blue, MD, FACP

## 2023-05-22 ENCOUNTER — Other Ambulatory Visit: Payer: Self-pay | Admitting: Internal Medicine

## 2023-05-22 DIAGNOSIS — Z8742 Personal history of other diseases of the female genital tract: Secondary | ICD-10-CM

## 2023-05-26 ENCOUNTER — Telehealth: Payer: Self-pay

## 2023-05-26 NOTE — Telephone Encounter (Signed)
Phone call returned to patient today. Patient has history of abnormal Pap smears for which she had seen Dr. Vergie Living in the past.  Recent Pap smear was positive for HPV but subset negative and no cancerous cells were seen.  I explained this result to the patient and informed her that just based on her past history and the fact that the HPV came back positive, I wanted her to see Dr. Vergie Living again just to make sure that nothing further needs to be done.  Patient had several questions about whether the HPV came from her having HIV or from her husband.  I answered her questions to the best of my ability.

## 2023-05-26 NOTE — Telephone Encounter (Signed)
Received call from pt regarding recent PAP smear. Pt is very upset regarding results.  She states that last year her PAP was fine, and this year it is not.  Pt would like further explanation of PAP results, and why these are so different from the PAP with Dr. Vergie Living.  Pt wants another PAP at Dr. Vergie Living' office.  PT would like a return call from Dr. Laural Benes regarding test results.

## 2023-05-26 NOTE — Progress Notes (Signed)
The 10-year ASCVD risk score (Arnett DK, et al., 2019) is: 2.5%   Values used to calculate the score:     Age: 52 years     Sex: Female     Is Non-Hispanic African American: Yes     Diabetic: No     Tobacco smoker: Yes     Systolic Blood Pressure: 106 mmHg     Is BP treated: No     HDL Cholesterol: 81 mg/dL     Total Cholesterol: 253 mg/dL  Sandie Ano, RN

## 2023-06-24 ENCOUNTER — Other Ambulatory Visit: Payer: Self-pay

## 2023-06-24 DIAGNOSIS — E441 Mild protein-calorie malnutrition: Secondary | ICD-10-CM

## 2023-06-24 MED ORDER — ENSURE PO LIQD
237.0000 mL | Freq: Two times a day (BID) | ORAL | 6 refills | Status: DC
Start: 2023-06-24 — End: 2023-11-03

## 2023-07-20 ENCOUNTER — Telehealth: Payer: Self-pay

## 2023-07-20 MED ORDER — OMEPRAZOLE 20 MG PO CPDR: 20.0000 mg | DELAYED_RELEASE_CAPSULE | Freq: Every day | ORAL | 3 refills | Status: DC

## 2023-07-20 NOTE — Telephone Encounter (Signed)
Patient called office today requesting refill on Omeprazole. Is still struggling with heartburn after taking Biktarvy. Would like rx sent to [facility] on [address]. Juanita Laster, RMA

## 2023-07-20 NOTE — Addendum Note (Signed)
Addended by: Jeanine Luz D on: 07/20/2023 12:36 PM   Modules accepted: Orders

## 2023-07-22 ENCOUNTER — Other Ambulatory Visit: Payer: Self-pay

## 2023-07-22 MED ORDER — OMEPRAZOLE 20 MG PO CPDR: 20.0000 mg | DELAYED_RELEASE_CAPSULE | Freq: Every day | ORAL | 1 refills | Status: DC

## 2023-07-22 NOTE — Telephone Encounter (Signed)
Patient called requesting Omeprazole rx be sent to [facility] on [address]. Advised patient to call pharmacy and request rx be transferred.  Juanita Laster, RMA

## 2023-07-28 ENCOUNTER — Other Ambulatory Visit: Payer: Self-pay

## 2023-07-28 ENCOUNTER — Encounter: Payer: Self-pay | Admitting: Family

## 2023-07-28 ENCOUNTER — Ambulatory Visit (INDEPENDENT_AMBULATORY_CARE_PROVIDER_SITE_OTHER): Payer: Medicaid Other | Admitting: Family

## 2023-07-28 VITALS — BP 109/67 | HR 80 | Temp 97.6°F | Ht <= 58 in | Wt 120.0 lb

## 2023-07-28 DIAGNOSIS — Z0000 Encounter for general adult medical examination without abnormal findings: Secondary | ICD-10-CM

## 2023-07-28 DIAGNOSIS — B20 Human immunodeficiency virus [HIV] disease: Secondary | ICD-10-CM | POA: Diagnosis present

## 2023-07-28 DIAGNOSIS — Z79899 Other long term (current) drug therapy: Secondary | ICD-10-CM

## 2023-07-28 DIAGNOSIS — F17200 Nicotine dependence, unspecified, uncomplicated: Secondary | ICD-10-CM

## 2023-07-28 DIAGNOSIS — Z23 Encounter for immunization: Secondary | ICD-10-CM | POA: Diagnosis not present

## 2023-07-28 MED ORDER — BIKTARVY 50-200-25 MG PO TABS: ORAL_TABLET | ORAL | 5 refills | Status: DC

## 2023-07-28 NOTE — Progress Notes (Signed)
Brief Narrative   Patient ID: Michelle Waters, female    DOB: 09-01-1971, 52 y.o.   MRN: [id number]  [name] is a 52 year old African-American female diagnosed with HIV in November 2018 with risk factor heterosexual contact. CD4 nadir of 1080 and viral load of 472.  Entered care at [facility] Stage 2. Genotype with K103R/V179D with resistance to Efavirinez and Nevirapine. No history of opportunistic infection. ART experienced with Biktarvy   Subjective:    Chief Complaint  Patient presents with   Follow-up   HIV Positive/AIDS    HPI:  Michelle Waters is a 52 y.o. female with HIV disease last seen on 02/03/2023 with well-controlled virus and good adherence and tolerance to [employer].  Viral load was undetectable with CD4 count 1012.  Renal function with creatinine 1.2 and EGFR of 54 indicating CKD stage IIIa.  Hepatic function within normal ranges.  Here today for follow-up.  [name] has been doing well since her last office visit and continues to take [facility] as prescribed with no adverse side effects.  No new concerns/complaints.  Condoms and STD testing offered.  Continues to smoke tobacco.  Interested in influenza and COVID vaccinations.  Due for mammogram for breast cancer screening.  Denies fevers, chills, night sweats, headaches, changes in vision, neck pain/stiffness, nausea, diarrhea, vomiting, lesions or rashes.  Lab Results  Component Value Date   CD4TCELL 56 02/03/2023   CD4TABS 1,012 02/03/2023   Lab Results  Component Value Date   HIV1RNAQUANT Not Detected 02/03/2023     Allergies  Allergen Reactions   Ampicillin Swelling and Rash   Penicillins Swelling and Rash   Shrimp [Shellfish Allergy] Swelling   Codeine Itching   Hydrocodone Itching      Outpatient Medications Prior to Visit  Medication Sig Dispense Refill   acetaminophen (TYLENOL) 500 MG tablet Take 1,000 mg by mouth every 6 (six) hours as needed for moderate pain.      albuterol (PROVENTIL  HFA;VENTOLIN HFA) 108 (90 Base) MCG/ACT inhaler Inhale 2 puffs into the lungs every 6 (six) hours as needed for wheezing or shortness of breath. 54 g 3   Ensure (ENSURE) Take 237 mLs by mouth 2 (two) times daily between meals. 237 mL 6   meloxicam (MOBIC) 15 MG tablet Take 1 tablet (15 mg total) by mouth daily. 30 tablet 2   omeprazole (PRILOSEC) 20 MG capsule Take 1 capsule (20 mg total) by mouth daily. Please schedule a yearly follow up for further refills. Thank you 30 capsule 1   sertraline (ZOLOFT) 50 MG tablet TAKE 1/2 TABLET BY MOUTH DAILY FOR 2 WEEKS, THEN TAKE 1 TABLET DAILY 90 tablet 1   bictegravir-emtricitabine-tenofovir AF (BIKTARVY) 50-200-25 MG TABS tablet TAKE 1 TABLET BY MOUTH DAILY 30 tablet 3   Facility-Administered Medications Prior to Visit  Medication Dose Route Frequency Provider Last Rate Last Admin   0.9 %  sodium chloride infusion  500 mL Intravenous Continuous Armbruster, Willaim Rayas, MD         Past Medical History:  Diagnosis Date   Acid reflux    Elevated serum creatinine 10/05/2017   HIV (human immunodeficiency virus infection) (HCC)    Hypertension      Past Surgical History:  Procedure Laterality Date   COLPOSCOPY  09/20/2020       I & D EXTREMITY Right 08/30/2017   Procedure: IRRIGATION AND DEBRIDEMENT INDEX FINGER;  Surgeon: Beverely Low, MD;  Location: [facility] OR;  Service: Orthopedics;  Laterality: Right;  Review of Systems  Constitutional:  Negative for appetite change, chills, diaphoresis, fatigue, fever and unexpected weight change.  Eyes:        Negative for acute change in vision  Respiratory:  Negative for chest tightness, shortness of breath and wheezing.   Cardiovascular:  Negative for chest pain.  Gastrointestinal:  Negative for diarrhea, nausea and vomiting.  Genitourinary:  Negative for dysuria, pelvic pain and vaginal discharge.  Musculoskeletal:  Negative for neck pain and neck stiffness.  Skin:  Negative for rash.  Neurological:   Negative for seizures, syncope, weakness and headaches.  Hematological:  Negative for adenopathy. Does not bruise/bleed easily.  Psychiatric/Behavioral:  Negative for hallucinations.       Objective:    BP 109/67   Pulse 80   Temp 97.6 F (36.4 C) (Temporal)   Ht 4\' 10"  (1.473 m)   Wt 120 lb (54.4 kg)   SpO2 98%   BMI 25.08 kg/m  Nursing note and vital signs reviewed.  Physical Exam Constitutional:      General: She is not in acute distress.    Appearance: She is well-developed.  Eyes:     Conjunctiva/sclera: Conjunctivae normal.  Cardiovascular:     Rate and Rhythm: Normal rate and regular rhythm.     Heart sounds: Normal heart sounds. No murmur heard.    No friction rub. No gallop.  Pulmonary:     Effort: Pulmonary effort is normal. No respiratory distress.     Breath sounds: Normal breath sounds. No wheezing or rales.  Chest:     Chest wall: No tenderness.  Abdominal:     General: Bowel sounds are normal.     Palpations: Abdomen is soft.     Tenderness: There is no abdominal tenderness.  Musculoskeletal:     Cervical back: Neck supple.  Lymphadenopathy:     Cervical: No cervical adenopathy.  Skin:    General: Skin is warm and dry.     Findings: No rash.  Neurological:     Mental Status: She is alert and oriented to person, place, and time.  Psychiatric:        Behavior: Behavior normal.        Thought Content: Thought content normal.        Judgment: Judgment normal.         05/19/2023    1:41 PM 02/03/2023   10:28 AM 12/29/2022    1:41 PM 02/04/2022   10:53 AM 08/22/2021    1:35 PM  Depression screen PHQ 2/9  Decreased Interest 1 0 3 0 0  Down, Depressed, Hopeless 1 0 3 0 1  PHQ - 2 Score 2 0 6 0 1  Altered sleeping 1  2    Tired, decreased energy 1  3    Change in appetite 0  3    Feeling bad or failure about yourself  1  2    Trouble concentrating 2  3    Moving slowly or fidgety/restless 0  2    Suicidal thoughts 0  0    PHQ-9 Score 7  21          Assessment & Plan:    Patient Active Problem List   Diagnosis Date Noted   GERD without esophagitis 10/16/2020   Cervical dysplasia 09/28/2020   Low grade squamous intraepithelial lesion on cytologic smear of cervix (LGSIL) 04/02/2020   Right foot pain 02/15/2020   Early syphilis, 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   Mild depression 11/11/2019   HIV  disease (HCC) 10/05/2017   Healthcare maintenance 10/05/2017   Tobacco dependence 01/17/2015   Family history of diabetes mellitus (DM) 01/17/2015     Problem List Items Addressed This Visit       Other   Tobacco dependence    [name] continues to smoke tobacco daily.  Discussed the importance of tobacco cessation to reduce risk of complications in the future.  Not ready to quit at this time.      HIV disease (HCC) - Primary    [name] continues to have well-controlled virus with good adherence and tolerance to [employer].  Reviewed previous lab work and discussed plan of care and U equals U.  Check lab work.  Continue current dose of Biktarvy.  Plan for follow-up in 6 months or sooner if needed with lab work on the same day.      Relevant Medications   bictegravir-emtricitabine-tenofovir AF (BIKTARVY) 50-200-25 MG TABS tablet   Other Relevant Orders   COMPLETE METABOLIC PANEL WITH GFR   HIV-1 RNA quant-no reflex-bld   T-helper cell (CD4)- ([facility] clinic only)   Healthcare maintenance    Discussed importance of safe sexual practice and condom use. Condoms and STD testing offered.  Influenza and COVID vaccinations updated. Colon cancer screening up-to-date. Cervical cancer screening up-to-date. She will schedule her mammogram at her next available opportunity. Dental contact information provided in after visit summary to make appointment for routine dental care.      Other Visit Diagnoses     Encounter for immunization       Relevant Orders   Flu vaccine trivalent PF, 6mos and older(Flulaval,Afluria,Fluarix,Fluzone) (Completed)    Pfizer Comirnaty Covid-19 Vaccine 37yrs & older (Completed)   Pharmacologic therapy       Relevant Orders   Lipid panel        I am having Michelle Waters maintain her albuterol, acetaminophen, sertraline, meloxicam, Ensure, omeprazole, and Biktarvy. We will continue to administer sodium chloride.   Meds ordered this encounter  Medications   bictegravir-emtricitabine-tenofovir AF (BIKTARVY) 50-200-25 MG TABS tablet    Sig: Take 1 tablet by mouth daily    Dispense:  30 tablet    Refill:  5    Patient requests delivery    Order Specific Question:   Supervising Provider    Answer:   Judyann Munson [phone]    Order Specific Question:   Prescription Type:    Answer:   Renewal     Follow-up: Return in about 6 months (around 01/26/2024), or if symptoms worsen or fail to improve. or sooner if needed.    Marcos Eke, MSN, FNP-C Nurse Practitioner [facility] for Infectious Disease [facility] Main number: [phone]

## 2023-07-28 NOTE — Assessment & Plan Note (Signed)
Michelle Waters continues to smoke tobacco daily.  Discussed the importance of tobacco cessation to reduce risk of complications in the future.  Not ready to quit at this time.

## 2023-07-28 NOTE — Patient Instructions (Addendum)
Nice to see you.  We will check your lab work today.  Continue to take your medication daily as prescribed.  Refills have been sent to the pharmacy.  Please call [facility]) to schedule/follow up on your dental care at [phone]  [facility] to schedule mammogram.   Plan for follow up in 6 months or sooner if needed with lab work on the same day.  Have a great day and stay safe!

## 2023-07-28 NOTE — Assessment & Plan Note (Signed)
Ms. Amenta continues to have well-controlled virus with good adherence and tolerance to Biktarvy.  Reviewed previous lab work and discussed plan of care and U equals U.  Check lab work.  Continue current dose of Biktarvy.  Plan for follow-up in 6 months or sooner if needed with lab work on the same day.

## 2023-07-28 NOTE — Assessment & Plan Note (Addendum)
Discussed importance of safe sexual practice and condom use. Condoms and STD testing offered.  Influenza and COVID vaccinations updated. Colon cancer screening up-to-date. Cervical cancer screening up-to-date. She will schedule her mammogram at her next available opportunity. Dental contact information provided in after visit summary to make appointment for routine dental care.

## 2023-07-29 LAB — T-HELPER CELL (CD4) - (RCID CLINIC ONLY)
CD4 % Helper T Cell: 59 % (ref 33–65)
CD4 T Cell Abs: 1096 /uL (ref 400–1790)

## 2023-07-31 LAB — COMPLETE METABOLIC PANEL WITH GFR
AG Ratio: 1.5 (calc) (ref 1.0–2.5)
ALT: 14 U/L (ref 6–29)
AST: 21 U/L (ref 10–35)
Albumin: 4.4 g/dL (ref 3.6–5.1)
Alkaline phosphatase (APISO): 113 U/L (ref 37–153)
BUN: 15 mg/dL (ref 7–25)
CO2: 24 mmol/L (ref 20–32)
Calcium: 9.8 mg/dL (ref 8.6–10.4)
Chloride: 108 mmol/L (ref 98–110)
Creat: 0.98 mg/dL (ref 0.50–1.03)
Globulin: 3 g/dL (ref 1.9–3.7)
Glucose, Bld: 80 mg/dL (ref 65–99)
Potassium: 4.1 mmol/L (ref 3.5–5.3)
Sodium: 139 mmol/L (ref 135–146)
Total Bilirubin: 0.2 mg/dL (ref 0.2–1.2)
Total Protein: 7.4 g/dL (ref 6.1–8.1)
eGFR: 69 mL/min/{1.73_m2} (ref >=60)

## 2023-07-31 LAB — LIPID PANEL
Cholesterol: 252 mg/dL — ABNORMAL HIGH (ref <200)
HDL: 57 mg/dL (ref >=50)
Non-HDL Cholesterol (Calc): 195 mg/dL — ABNORMAL HIGH (ref <130)
Total CHOL/HDL Ratio: 4.4 (calc) (ref <5.0)
Triglycerides: 735 mg/dL — ABNORMAL HIGH (ref <150)

## 2023-07-31 LAB — HIV-1 RNA QUANT-NO REFLEX-BLD
HIV 1 RNA Quant: NOT DETECTED {copies}/mL
HIV-1 RNA Quant, Log: NOT DETECTED {Log}

## 2023-08-04 ENCOUNTER — Telehealth: Payer: Self-pay

## 2023-08-04 NOTE — Telephone Encounter (Signed)
-----   Message from Jeanine Luz sent at 08/03/2023  4:56 PM EDT ----- Please inform Michelle Waters that her lab work shows her viral load is undetectable and CD4 count 1096. Kidney function and electrolytes within normal ranges. Triglycerides are high at 735 and would recommend cutting back on saturated fats and sugary/processed foods and alcohol. If this continues to remain elevated we may need to consider addition of fish oil supplements. Thanks!

## 2023-08-04 NOTE — Telephone Encounter (Signed)
Attempted to call patient regarding results. Not able to reach her at this time. Not able to leave voicemail.  Juanita Laster, RMA

## 2023-08-05 NOTE — Telephone Encounter (Signed)
Second attempt made to reach patient.  Michelle Waters, RMA

## 2023-08-06 NOTE — Telephone Encounter (Signed)
Attempted to contact patient - call couldn't be completed at this time.   Michelle Waters Lesli Albee, CMA

## 2023-08-11 NOTE — Telephone Encounter (Signed)
Not able to connect with patient. Will mail out letter. Juanita Laster, RMA

## 2023-08-16 ENCOUNTER — Other Ambulatory Visit: Payer: Self-pay | Admitting: Family

## 2023-08-16 DIAGNOSIS — B20 Human immunodeficiency virus [HIV] disease: Secondary | ICD-10-CM

## 2023-08-17 NOTE — Telephone Encounter (Signed)
Michelle Waters called with trouble getting her Biktarvy. Updated her phone number and relayed that viral load undetectable, CD4 count healthy at over 1000, kidney function and electrolytes within normal ranges.   Discussed elevated triglycerides and diet modifications such as cutting back on saturated fats, processed/sugary foods and alcohol. Also discussed possible need to add a fish oil supplement.   Spoke with [facility], her Susanne Borders should be ready in about 90 minutes. Relayed this to Castine, she would like it delivered. Encouraged her to call [facility] to set up delivery.  She would like to go ahead and start with a fish oil supplement, but reports she has a shellfish allergy and wants to make sure this would be okay. Will route to provider.   Sandie Ano, RN

## 2023-08-18 NOTE — Telephone Encounter (Signed)
Patient aware and voiced her understanding.   Michelle Waters P [name], CMA  

## 2023-08-18 NOTE — Telephone Encounter (Signed)
Spoke with Michelle Waters, she reports allergy is to shellfish only and that she is able to eat fish. Relayed per Marcos Eke, NP that fish oil supplements should be okay then. Recommended she pick up over the counter. Patient verbalized understanding and has no further questions.   Sandie Ano, RN

## 2023-08-24 ENCOUNTER — Telehealth: Payer: Self-pay

## 2023-08-24 ENCOUNTER — Other Ambulatory Visit: Payer: Self-pay | Admitting: Family

## 2023-08-24 MED ORDER — FISH OIL 500 MG PO CAPS: 2.0000 | ORAL_CAPSULE | Freq: Two times a day (BID) | ORAL | 2 refills | Status: DC

## 2023-08-24 NOTE — Telephone Encounter (Signed)
Patient called office to follow up on prescription for fish oil pills. Would like to check status to confirm pharmacy received prescription.  Spoke with patient's husband to inform him that prescription was called in today. Juanita Laster, RMA

## 2023-09-28 ENCOUNTER — Encounter: Payer: Medicaid Other | Admitting: Obstetrics and Gynecology

## 2023-10-15 ENCOUNTER — Telehealth: Payer: Self-pay

## 2023-10-15 MED ORDER — OMEPRAZOLE 20 MG PO CPDR: 20.0000 mg | DELAYED_RELEASE_CAPSULE | Freq: Every day | ORAL | 1 refills | Status: DC

## 2023-10-15 NOTE — Telephone Encounter (Signed)
Patient called office with multiple concerns today. Is requesting refill on Omeprazole. Rx sent to [facility] [address] as requested.  Patient also c/o of swelling on right ankle/ foot. Is worried it might be gout. Is also having concerns regarding hole in right foot close to small toe. Denies any draining or pain at this time. Advised patient she should contact PCP for appt. Refused this as she is working on finding new PCP.  Will  keep an eye on area and will call if any changes.  Juanita Laster, RMA

## 2023-11-03 ENCOUNTER — Telehealth: Payer: Self-pay

## 2023-11-03 DIAGNOSIS — E441 Mild protein-calorie malnutrition: Secondary | ICD-10-CM

## 2023-11-03 MED ORDER — ENSURE PO LIQD: 237.0000 mL | Freq: Two times a day (BID) | ORAL | 6 refills | Status: AC

## 2023-11-03 NOTE — Telephone Encounter (Signed)
 Patient called stating she got a new case manager with [facility] and needs her Ensure Rx faxed to them.   Is asking about her labs, relayed her viral load was undetectable in October. She'd like to know about her cholesterol. Discussed this was elevated. She would like to know if she can be put on a statin. Will route to provider.   Also asking if she needs a vaccine for Norovirus. Discussed there is no vaccine and recommended good hand hygiene and that she wash produce thoroughly.   Agueda Houpt D Chandlar Staebell, RN

## 2023-11-04 ENCOUNTER — Other Ambulatory Visit: Payer: Self-pay | Admitting: Family

## 2023-11-04 MED ORDER — FENOFIBRATE 48 MG PO TABS: 48.0000 mg | ORAL_TABLET | Freq: Every day | ORAL | 4 refills | Status: DC

## 2023-11-04 NOTE — Telephone Encounter (Addendum)
 Spoke with Michelle Waters, notified her that fenofibrate  (1 tablet daily) was sent to pharmacy for cholesterol concerns. Patient verbalized understanding and has no further questions.   Paden Kuras D Ritchard Paragas, RN

## 2023-11-23 ENCOUNTER — Ambulatory Visit: Payer: Medicaid Other | Admitting: Internal Medicine

## 2023-11-25 ENCOUNTER — Other Ambulatory Visit (HOSPITAL_COMMUNITY): Payer: Self-pay

## 2023-11-25 ENCOUNTER — Telehealth: Payer: Self-pay

## 2023-11-25 NOTE — Telephone Encounter (Signed)
 Patient called stating that [facility] told her that Biktarvy  was  delivered on 11/22/2023 but she never got the delivery.   I called and spoke to [facility] - pharmacist stated that medication was delivered on 2/2 and a photo was provided.   Informed patient what [facility] told me. [facility] and was told she wouldn't be eligible for a refill until next month.    1 month supply of Biktarvy  samples will be up front for patient. - front desk please ask Arland or Charmaine to get samples when patient comes to pick up.    Patient stated in the future she will pick up medication.   Antar Milks SHAUNNA Letters, CMA

## 2023-12-02 ENCOUNTER — Other Ambulatory Visit: Payer: Self-pay | Admitting: Pharmacist

## 2023-12-02 DIAGNOSIS — B20 Human immunodeficiency virus [HIV] disease: Secondary | ICD-10-CM

## 2023-12-02 MED ORDER — BIKTARVY 50-200-25 MG PO TABS
1.0000 | ORAL_TABLET | Freq: Every day | ORAL | Status: AC
Start: 2023-11-27 — End: 2023-12-18

## 2023-12-02 NOTE — Progress Notes (Signed)
Medication Samples have been provided to the patient.  Drug name: Biktarvy        Strength: 50/200/25 mg       Qty: 21 tablets   LOT: [id number]   Exp.Date: 07/2025  Samples requested by [facility].  Dosing instructions: Take one tablet by mouth once daily  The patient has been instructed regarding the correct time, dose, and frequency of taking this medication, including desired effects and most common side effects.   Racine Erby L. Jannette Fogo, PharmD, BCIDP, AAHIVP, CPP Clinical Pharmacist Practitioner Infectious Diseases Clinical Pharmacist [facility] 10/01/2020, 10:07 AM

## 2023-12-11 ENCOUNTER — Telehealth: Payer: Self-pay

## 2023-12-11 NOTE — Telephone Encounter (Signed)
Message has been sent to front desk to schedule patient.     Copied from CRM [id number]. Topic: General - Transportation >> Dec 10, 2023 11:24 AM Michelle Waters wrote: Reason for CRM: Patient is calling to set up transportation for her appointment on 2/27 at 9:30am with Dr.Johnson. Also patient is requesting that Dr. Laural Benes signs off on the patient hospital record so she can apply for disability.

## 2023-12-17 ENCOUNTER — Ambulatory Visit: Payer: Medicaid Other | Attending: Internal Medicine | Admitting: Internal Medicine

## 2023-12-17 ENCOUNTER — Telehealth: Payer: Self-pay | Admitting: Internal Medicine

## 2023-12-17 VITALS — BP 106/72 | HR 75 | Temp 98.2°F | Ht <= 58 in | Wt 119.0 lb

## 2023-12-17 DIAGNOSIS — F331 Major depressive disorder, recurrent, moderate: Secondary | ICD-10-CM

## 2023-12-17 DIAGNOSIS — B20 Human immunodeficiency virus [HIV] disease: Secondary | ICD-10-CM

## 2023-12-17 DIAGNOSIS — F411 Generalized anxiety disorder: Secondary | ICD-10-CM | POA: Diagnosis not present

## 2023-12-17 DIAGNOSIS — Z23 Encounter for immunization: Secondary | ICD-10-CM | POA: Diagnosis not present

## 2023-12-17 DIAGNOSIS — Z8742 Personal history of other diseases of the female genital tract: Secondary | ICD-10-CM | POA: Diagnosis not present

## 2023-12-17 DIAGNOSIS — E781 Pure hyperglyceridemia: Secondary | ICD-10-CM

## 2023-12-17 DIAGNOSIS — F17210 Nicotine dependence, cigarettes, uncomplicated: Secondary | ICD-10-CM | POA: Diagnosis not present

## 2023-12-17 DIAGNOSIS — H547 Unspecified visual loss: Secondary | ICD-10-CM

## 2023-12-17 DIAGNOSIS — Z1231 Encounter for screening mammogram for malignant neoplasm of breast: Secondary | ICD-10-CM

## 2023-12-17 DIAGNOSIS — F17200 Nicotine dependence, unspecified, uncomplicated: Secondary | ICD-10-CM

## 2023-12-17 MED ORDER — SERTRALINE HCL 50 MG PO TABS: ORAL_TABLET | ORAL | 1 refills | Status: DC

## 2023-12-17 MED ORDER — BIKTARVY 50-200-25 MG PO TABS: ORAL_TABLET | ORAL | 2 refills | Status: DC

## 2023-12-17 MED ORDER — OMEPRAZOLE 20 MG PO CPDR: 20.0000 mg | DELAYED_RELEASE_CAPSULE | Freq: Every day | ORAL | 1 refills | Status: DC
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
## 2023-12-23 ENCOUNTER — Telehealth: Payer: Self-pay | Admitting: Licensed Clinical Social Worker

## 2023-12-23 NOTE — Telephone Encounter (Signed)
 LCSWA Intern called patient today to introduce herself and to assess patients' mental health needs. Patient did answer the phone. LCSWA Intern was able to speak with patient briefly about her depression and anxiety.  Patient stated that she has not yet started her medication.  Patient will call clinic when she picks up her medication patient stated she is doing okay and getting by with her depression and anxiety current.  Patient was referred by PCP for depression and anxiety.

## 2023-12-24 ENCOUNTER — Ambulatory Visit (HOSPITAL_COMMUNITY): Payer: Self-pay | Admitting: Clinical

## 2023-12-24 ENCOUNTER — Encounter (HOSPITAL_COMMUNITY): Payer: Self-pay

## 2024-01-03 NOTE — Telephone Encounter (Signed)
 Marland Kitchen

## 2024-01-15 ENCOUNTER — Encounter (HOSPITAL_COMMUNITY): Payer: Self-pay | Admitting: Student

## 2024-01-15 ENCOUNTER — Ambulatory Visit (INDEPENDENT_AMBULATORY_CARE_PROVIDER_SITE_OTHER): Payer: Self-pay | Admitting: Student

## 2024-01-15 DIAGNOSIS — F331 Major depressive disorder, recurrent, moderate: Secondary | ICD-10-CM | POA: Diagnosis not present

## 2024-01-15 DIAGNOSIS — F411 Generalized anxiety disorder: Secondary | ICD-10-CM | POA: Diagnosis not present
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
## 2024-01-15 NOTE — Patient Instructions (Signed)
 [facility]  URGENT CARE: Open 24 hours per day for acute and/or urgent behavioral health concerns.   OUTPATIENT Walk-in information:  Please note, all walk-ins are first come & first serve, with limited number of availability. Therapist for therapy:  Monday, Tuesday, Wednesday & Thursday mornings Please ARRIVE at 7:00 AM for registration Will START at 8:00 AM Every 1st, 2nd & 3rd Friday of the month: Please ARRIVE at 7:00 AM for registration Will START at 1 PM - 5 PM Psychiatrist for medication management: Monday - Friday:  Please ARRIVE at 7:00 AM for registration Will START at 8:00 AM Appointments times are as follows:  - New patients get 1 hr ex: 8-9 am 9-10 & 10-11 then that's it.  - Existing pt's that are not seeing their provider will still take 1hr as they would be new to the provider that is covering walk ins that day.  - Existing follow ups get 30 mins.... (if they have been seen by the provider covering walk ins that day.)        Regretfully, due to limited availability, please be aware that you may not been seen on the same day as walk-in. Please consider making an appoint or try again. Thank you for your patience and understanding.  [facility] [place] [address] [place], [place] [address] [phone]  New patients are seen at their walk-in clinic. Walk-in hours are Monday - Friday from 8:30 am - 12:00 pm, and from 1:00 pm - 2:30 pm.   Walk-in patients are seen on a first come, first served basis, so try to arrive as early as possible for the best chance of being seen the same day.

## 2024-01-18 DIAGNOSIS — F331 Major depressive disorder, recurrent, moderate: Secondary | ICD-10-CM | POA: Insufficient documentation

## 2024-01-18 DIAGNOSIS — F411 Generalized anxiety disorder: Secondary | ICD-10-CM | POA: Insufficient documentation

## 2024-02-14 ENCOUNTER — Inpatient Hospital Stay (HOSPITAL_COMMUNITY)

## 2024-02-14 ENCOUNTER — Emergency Department (HOSPITAL_COMMUNITY)

## 2024-02-14 ENCOUNTER — Other Ambulatory Visit: Payer: Self-pay

## 2024-02-14 ENCOUNTER — Inpatient Hospital Stay (HOSPITAL_COMMUNITY)
Admission: EM | Admit: 2024-02-14 | Discharge: 2024-02-18 | DRG: 540 | Disposition: A | Discharge status: Home / Self Care | Attending: Family Medicine | Admitting: Family Medicine

## 2024-02-14 ENCOUNTER — Encounter (HOSPITAL_COMMUNITY): Payer: Self-pay

## 2024-02-14 DIAGNOSIS — F329 Major depressive disorder, single episode, unspecified: Secondary | ICD-10-CM | POA: Diagnosis present

## 2024-02-14 DIAGNOSIS — Z79899 Other long term (current) drug therapy: Secondary | ICD-10-CM | POA: Diagnosis not present

## 2024-02-14 DIAGNOSIS — Z91013 Allergy to seafood: Secondary | ICD-10-CM | POA: Diagnosis not present

## 2024-02-14 DIAGNOSIS — I10 Essential (primary) hypertension: Secondary | ICD-10-CM | POA: Diagnosis present

## 2024-02-14 DIAGNOSIS — L0390 Cellulitis, unspecified: Secondary | ICD-10-CM | POA: Diagnosis present

## 2024-02-14 DIAGNOSIS — R0602 Shortness of breath: Secondary | ICD-10-CM

## 2024-02-14 DIAGNOSIS — K219 Gastro-esophageal reflux disease without esophagitis: Secondary | ICD-10-CM | POA: Diagnosis present

## 2024-02-14 DIAGNOSIS — Z801 Family history of malignant neoplasm of trachea, bronchus and lung: Secondary | ICD-10-CM | POA: Diagnosis not present

## 2024-02-14 DIAGNOSIS — Z880 Allergy status to penicillin: Secondary | ICD-10-CM

## 2024-02-14 DIAGNOSIS — M86171 Other acute osteomyelitis, right ankle and foot: Secondary | ICD-10-CM | POA: Diagnosis not present

## 2024-02-14 DIAGNOSIS — M868X7 Other osteomyelitis, ankle and foot: Secondary | ICD-10-CM | POA: Diagnosis present

## 2024-02-14 DIAGNOSIS — L03115 Cellulitis of right lower limb: Secondary | ICD-10-CM | POA: Diagnosis present

## 2024-02-14 DIAGNOSIS — F32A Depression, unspecified: Secondary | ICD-10-CM | POA: Diagnosis present

## 2024-02-14 DIAGNOSIS — Z789 Other specified health status: Secondary | ICD-10-CM

## 2024-02-14 DIAGNOSIS — M869 Osteomyelitis, unspecified: Secondary | ICD-10-CM

## 2024-02-14 DIAGNOSIS — Z8249 Family history of ischemic heart disease and other diseases of the circulatory system: Secondary | ICD-10-CM

## 2024-02-14 DIAGNOSIS — E785 Hyperlipidemia, unspecified: Secondary | ICD-10-CM | POA: Diagnosis present

## 2024-02-14 DIAGNOSIS — D649 Anemia, unspecified: Secondary | ICD-10-CM | POA: Diagnosis present

## 2024-02-14 DIAGNOSIS — Z833 Family history of diabetes mellitus: Secondary | ICD-10-CM | POA: Diagnosis not present

## 2024-02-14 DIAGNOSIS — B20 Human immunodeficiency virus [HIV] disease: Secondary | ICD-10-CM | POA: Diagnosis present

## 2024-02-14 DIAGNOSIS — L03119 Cellulitis of unspecified part of limb: Principal | ICD-10-CM

## 2024-02-14 DIAGNOSIS — Z885 Allergy status to narcotic agent status: Secondary | ICD-10-CM | POA: Diagnosis not present

## 2024-02-14 DIAGNOSIS — L97519 Non-pressure chronic ulcer of other part of right foot with unspecified severity: Secondary | ICD-10-CM | POA: Diagnosis present

## 2024-02-14 DIAGNOSIS — F17210 Nicotine dependence, cigarettes, uncomplicated: Secondary | ICD-10-CM | POA: Diagnosis present

## 2024-02-14 DIAGNOSIS — L03031 Cellulitis of right toe: Secondary | ICD-10-CM | POA: Diagnosis not present

## 2024-02-14 DIAGNOSIS — F1090 Alcohol use, unspecified, uncomplicated: Secondary | ICD-10-CM | POA: Insufficient documentation

## 2024-02-14 DIAGNOSIS — L02611 Cutaneous abscess of right foot: Secondary | ICD-10-CM | POA: Diagnosis present

## 2024-02-14 LAB — BASIC METABOLIC PANEL WITH GFR
Anion gap: 8 (ref 5–15)
BUN: 12 mg/dL (ref 6–20)
CO2: 24 mmol/L (ref 22–32)
Calcium: 9.1 mg/dL (ref 8.9–10.3)
Chloride: 105 mmol/L (ref 98–111)
Creatinine, Ser: 1.1 mg/dL — ABNORMAL HIGH (ref 0.44–1.00)
GFR, Estimated: 60 mL/min — ABNORMAL LOW (ref >60)
Glucose, Bld: 103 mg/dL — ABNORMAL HIGH (ref 70–99)
Potassium: 4 mmol/L (ref 3.5–5.1)
Sodium: 137 mmol/L (ref 135–145)

## 2024-02-14 LAB — CBC WITH DIFFERENTIAL/PLATELET
Abs Immature Granulocytes: 0.02 10*3/uL (ref 0.00–0.07)
Basophils Absolute: 0 10*3/uL (ref 0.0–0.1)
Basophils Relative: 1 %
Eosinophils Absolute: 0.1 10*3/uL (ref 0.0–0.5)
Eosinophils Relative: 1 %
HCT: 36.4 % (ref 36.0–46.0)
Hemoglobin: 11.9 g/dL — ABNORMAL LOW (ref 12.0–15.0)
Immature Granulocytes: 0 %
Lymphocytes Relative: 34 %
Lymphs Abs: 2.5 10*3/uL (ref 0.7–4.0)
MCH: 31.2 pg (ref 26.0–34.0)
MCHC: 32.7 g/dL (ref 30.0–36.0)
MCV: 95.3 fL (ref 80.0–100.0)
Monocytes Absolute: 0.6 10*3/uL (ref 0.1–1.0)
Monocytes Relative: 8 %
Neutro Abs: 4.1 10*3/uL (ref 1.7–7.7)
Neutrophils Relative %: 56 %
Platelets: 323 10*3/uL (ref 150–400)
RBC: 3.82 MIL/uL — ABNORMAL LOW (ref 3.87–5.11)
RDW: 12.5 % (ref 11.5–15.5)
WBC: 7.4 10*3/uL (ref 4.0–10.5)
nRBC: 0 % (ref 0.0–0.2)

## 2024-02-14 LAB — C-REACTIVE PROTEIN: CRP: 2 mg/dL — ABNORMAL HIGH (ref <1.0)

## 2024-02-14 LAB — SEDIMENTATION RATE: Sed Rate: 10 mm/h (ref 0–22)

## 2024-02-14 MED ORDER — METRONIDAZOLE 500 MG/100ML IV SOLN
500.0000 mg | Freq: Once | INTRAVENOUS | Status: AC
  Administered 2024-02-14: 500 mg via INTRAVENOUS
  Filled 2024-02-14: qty 100

## 2024-02-14 MED ORDER — BICTEGRAVIR-EMTRICITAB-TENOFOV 50-200-25 MG PO TABS
1.0000 | ORAL_TABLET | Freq: Every day | ORAL | Status: DC
  Administered 2024-02-15 – 2024-02-18 (×4): 1 via ORAL
  Filled 2024-02-14 (×4): qty 1

## 2024-02-14 MED ORDER — MORPHINE SULFATE (PF) 4 MG/ML IV SOLN
4.0000 mg | Freq: Once | INTRAVENOUS | Status: AC
  Administered 2024-02-14: 4 mg via INTRAVENOUS
  Filled 2024-02-14: qty 1

## 2024-02-14 MED ORDER — THIAMINE HCL 100 MG/ML IJ SOLN
100.0000 mg | Freq: Every day | INTRAMUSCULAR | Status: DC
  Filled 2024-02-14: qty 2

## 2024-02-14 MED ORDER — OXYCODONE HCL 5 MG PO TABS
5.0000 mg | ORAL_TABLET | ORAL | Status: DC | PRN
  Administered 2024-02-14 – 2024-02-17 (×9): 5 mg via ORAL
  Filled 2024-02-14 (×9): qty 1

## 2024-02-14 MED ORDER — THIAMINE MONONITRATE 100 MG PO TABS
100.0000 mg | ORAL_TABLET | Freq: Every day | ORAL | Status: DC
  Administered 2024-02-14 – 2024-02-18 (×5): 100 mg via ORAL
  Filled 2024-02-14 (×5): qty 1

## 2024-02-14 MED ORDER — VANCOMYCIN HCL IN DEXTROSE 1-5 GM/200ML-% IV SOLN
1000.0000 mg | Freq: Once | INTRAVENOUS | Status: AC
  Administered 2024-02-14: 1000 mg via INTRAVENOUS
  Filled 2024-02-14: qty 200

## 2024-02-14 MED ORDER — HEPARIN SODIUM (PORCINE) 5000 UNIT/ML IJ SOLN
5000.0000 [IU] | Freq: Three times a day (TID) | INTRAMUSCULAR | Status: DC
  Administered 2024-02-14 – 2024-02-18 (×11): 5000 [IU] via SUBCUTANEOUS
  Filled 2024-02-14 (×12): qty 1

## 2024-02-14 MED ORDER — FOLIC ACID 1 MG PO TABS
1.0000 mg | ORAL_TABLET | Freq: Every day | ORAL | Status: DC
  Administered 2024-02-14 – 2024-02-18 (×5): 1 mg via ORAL
  Filled 2024-02-14 (×5): qty 1

## 2024-02-14 MED ORDER — ENOXAPARIN SODIUM 40 MG/0.4ML IJ SOSY: 40.0000 mg | PREFILLED_SYRINGE | INTRAMUSCULAR | Status: DC

## 2024-02-14 MED ORDER — NICOTINE 21 MG/24HR TD PT24
21.0000 mg | MEDICATED_PATCH | Freq: Every day | TRANSDERMAL | Status: DC
  Administered 2024-02-14 – 2024-02-18 (×5): 21 mg via TRANSDERMAL
  Filled 2024-02-14 (×5): qty 1

## 2024-02-14 MED ORDER — ORAL CARE MOUTH RINSE: 15.0000 mL | OROMUCOSAL | Status: DC | PRN

## 2024-02-14 MED ORDER — ACETAMINOPHEN 650 MG RE SUPP: 650.0000 mg | Freq: Four times a day (QID) | RECTAL | Status: DC | PRN

## 2024-02-14 MED ORDER — SODIUM CHLORIDE 0.9 % IV BOLUS
1000.0000 mL | Freq: Once | INTRAVENOUS | Status: AC
  Administered 2024-02-14: 1000 mL via INTRAVENOUS

## 2024-02-14 MED ORDER — IOHEXOL 350 MG/ML SOLN
75.0000 mL | Freq: Once | INTRAVENOUS | Status: AC | PRN
  Administered 2024-02-14: 75 mL via INTRAVENOUS

## 2024-02-14 MED ORDER — ADULT MULTIVITAMIN W/MINERALS CH
1.0000 | ORAL_TABLET | Freq: Every day | ORAL | Status: DC
  Administered 2024-02-14 – 2024-02-18 (×5): 1 via ORAL
  Filled 2024-02-14 (×5): qty 1

## 2024-02-14 MED ORDER — FENOFIBRATE 54 MG PO TABS
54.0000 mg | ORAL_TABLET | Freq: Every day | ORAL | Status: DC
  Administered 2024-02-15 – 2024-02-18 (×4): 54 mg via ORAL
  Filled 2024-02-14 (×4): qty 1

## 2024-02-14 MED ORDER — ACETAMINOPHEN 325 MG PO TABS
650.0000 mg | ORAL_TABLET | Freq: Four times a day (QID) | ORAL | Status: DC | PRN
  Administered 2024-02-14 – 2024-02-18 (×3): 650 mg via ORAL
  Filled 2024-02-14 (×3): qty 2

## 2024-02-14 MED ORDER — PANTOPRAZOLE SODIUM 40 MG PO TBEC
40.0000 mg | DELAYED_RELEASE_TABLET | Freq: Every day | ORAL | Status: DC
  Administered 2024-02-15 – 2024-02-18 (×4): 40 mg via ORAL
  Filled 2024-02-14 (×4): qty 1

## 2024-02-14 MED ORDER — SODIUM CHLORIDE 0.9 % IV SOLN
2.0000 g | Freq: Once | INTRAVENOUS | Status: AC
  Administered 2024-02-14: 2 g via INTRAVENOUS
  Filled 2024-02-14: qty 20

## 2024-02-14 MED ORDER — ONDANSETRON HCL 4 MG/2ML IJ SOLN
4.0000 mg | Freq: Once | INTRAMUSCULAR | Status: AC
  Administered 2024-02-14: 4 mg via INTRAVENOUS
  Filled 2024-02-14: qty 2

## 2024-02-14 NOTE — H&P (Cosign Needed Addendum)
 Hospital Admission History and Physical Service Pager: [phone]  Patient name: Michelle Waters Medical record number: [id number] Date of Birth: [birth year] Age: 53 y.o. Gender: female  Primary Care Provider: Lawrance Presume, MD Consultants: Orthopedic surgery Code Status: Full code Preferred Emergency Contact:  Contact Information     Name Relation Home Work Mobile   Cane Beds Mother [phone]        Other Contacts   None on File    Chief Complaint: Toe pain  Assessment and Plan: Michelle Waters is a 53 y.o. female presenting with right foot pain worsening for several weeks. Likely cellulitis seen on imaging in ED. Differential for presentation of this includes cellulitis, osteomyelitis.   - Cellulitis almost certainly present based on imaging and exam.  Patient has swelling, fluctuance, and purulent drainage. - Gas seen on imaging, concerning for necrotizing infection. - No evidence of osteomyelitis on initial imaging.  Assessment & Plan Cellulitis History of HIV well-controlled on Biktarvy .  Imaging obtained by ED with evidence of gas, additionally patient does have fluctuance without frank crepitus. Concern for necrotizing infection. Will continue IV antibiotics, obtain further imaging, but anticipate patient will need orthopedic consult. Patient VSS, not concerned for systemic infection at this time. - Admit to [facility]MT[facility], attending Dr. Drue Gerald - med surg, Vital signs per floor - PT/OT to treat - Orthopedics consulted and will see patient, greatly appreciate recommendations. - Continue IV antibiotics: CTX daily, Flagyl  q12, vancomycin daily - pain regimen: Tylenol  q6h PRN, oxycodone  5 mg q4h PRN - Stat right foot CT for further evaluation - AM CBC, BMP, CD4, HIV RNA - follow up blood cultures - Fall precautions Alcohol use Reports 40 ounce beer every other day.  No history of alcohol withdrawal. - CIWAs - Daily MVI, thiamine, folic acid Chronic health  problem Holding home meds at this time while NPO, pending surgical evaluation - restart as indicated. HIV GERD Depression Smoking history - nicotine patch  FEN/GI: N.p.o. pending surgical eval VTE Prophylaxis: Heparin pending surgical eval  Disposition: med surg  History of Present Illness:  Michelle Waters is a 53 y.o. female presenting with toe pain.  About 2 months ago, patient felt an itch between her 4th and 5th toes.  She picked at the area, and it made a sore, this healed normally.  However, over the last week, the area seem to spontaneously get more red and swollen.  She has noticed over this time, there is also been worsening pus and bleeding from the site.  The swelling has got to a point that makes it very painful to walk, so she has to walk on her heel now.  Increasing pain despite OTC pain relievers led her to come to the ED.  She denies fever.  No nausea, vomiting.  She has had some diarrhea.  She does not think the area has been spreading much.  No other episodes of cellulitis in the past.  She does have HIV and has been taking her Biktarvy  as prescribed.  In the ED, labs reveal elevated CRP, no leukocytosis.  Blood cultures collected.  Right foot x-ray with soft tissue swelling on the small toe with soft tissue gas, no evidence of osteomyelitis.  Patient given 1 L IVF, morphine .  She was called for admission for further management of infection.  Review Of Systems: Per HPI.  Pertinent Past Medical History: HIV, well-controlled on Biktarvy  HTN MDD GERD Remainder reviewed in history tab.   Pertinent Past Surgical History:  I&D of right index finger Remainder reviewed in history tab.   Pertinent Social History: Tobacco use: 1 pack a day since her 70s Alcohol use: 40 ounce beer every other day Other Substance use: None Lives with husband and dog  Pertinent Family History: Mother-HTN, diabetes Maternal grandmother-lung cancer Maternal uncle-lung cancer Remainder  reviewed in history tab.   Important Outpatient Medications: Biktarvy  daily for HIV Tylenol  1000 mg Q6 as needed for pain Fenofibrate  48 mg daily Prilosec 20 mg daily Remainder reviewed in medication history.   Objective: BP 137/84 (BP Location: Right Arm)   Pulse 69   Temp 98.5 F (36.9 C) (Oral)   Resp 18   Ht 4\' 10"  (1.473 m)   Wt 54.9 kg   SpO2 100%   BMI 25.29 kg/m  Exam: General: Sitting up in bed and moving about without pain, conversant, no acute distress Eyes: EOM grossly intact without scleral icterus Neck: Range of motion grossly normal Cardiovascular: Regular rate and rhythm without murmurs rubs or gallops Respiratory: Clear to auscultation bilaterally without wheezes rales or rhonchi Gastrointestinal: Soft, nontender, nondistended, normoactive bowel sounds MSK/Derm: Moves all extremities grossly equally, swelling and erythema noted between 4th and 5th right toes, pain elicited and purulence expressed with right pinky toe abduction, no crepitus appreciated though she does have fluctuance on the dorsal side between the 4th and 5th right toes Neuro: No gross focal deficit, ambulating well from exam table to wheelchair Psych: Alert, normal mood and affect, normal thought process, moderately rapid speech  Labs:  CBC BMET  Recent Labs  Lab 02/14/24 1303  WBC 7.4  HGB 11.9*  HCT 36.4  PLT 323   Recent Labs  Lab 02/14/24 1303  NA 137  K 4.0  CL 105  CO2 24  BUN 12  CREATININE 1.10*  GLUCOSE 103*  CALCIUM  9.1    CRP 2.0 ESR 10 Blood cultures collected  4/27 Right foot xray: IMPRESSION: Soft tissue swelling centered about the fifth and less so fourth digits with foci of soft tissue gas, suspicious for gas producing infection. No plain film evidence of osteomyelitis.  Omar Bibber, DO 02/14/2024, 3:30 PM PGY-1, [facility]  FPTS Intern pager: [phone], text pages welcome Secure chat group [facility] Teaching Service

## 2024-02-14 NOTE — Assessment & Plan Note (Addendum)
 Holding home meds at this time while NPO, pending surgical evaluation - restart as indicated. HIV GERD Depression Smoking history - nicotine patch

## 2024-02-14 NOTE — Consult Note (Signed)
 Reason for Consult: right foot infection Referring Physician:   Santos L Waters is an 53 y.o. female.  HPI: Michelle Waters is a 53 y.o. female who presented with the chief complaint of right fifth toe pain.  About 2 months ago, patient felt an itch between her 4th and 5th toes.  She picked at the area, and it made a sore, this healed normally.  However, over the last week, the area seem to spontaneously get more red and swollen.  She has noticed over this time, there is also been worsening pus and bleeding from the site.  The swelling has got to a point that makes it very painful to walk, so she has to walk on her heel now.  Increasing pain despite OTC pain relievers led her to come to the ED. She denies fever.  No nausea, vomiting.  She has had some diarrhea.  She does not think the area has been spreading much.  No other episodes of cellulitis in the past. She denies known diagnosis of diabetes and states she smokes a pack every 2 days.  Past Medical History:  Diagnosis Date   Acid reflux    Elevated serum creatinine 10/05/2017   HIV (human immunodeficiency virus infection) (HCC)    Hypertension     Past Surgical History:  Procedure Laterality Date   COLPOSCOPY  09/20/2020       I & D EXTREMITY Right 08/30/2017   Procedure: IRRIGATION AND DEBRIDEMENT INDEX FINGER;  Surgeon: Winston Hawking, MD;  Location: [facility] OR;  Service: Orthopedics;  Laterality: Right;    Family History  Problem Relation Age of Onset   Hypertension Mother    Diabetes Mother    Lung cancer Maternal Uncle    Lung cancer Maternal Grandmother    Rectal cancer Neg Hx    Stomach cancer Neg Hx     Social History:  reports that she has been smoking cigarettes. She has never used smokeless tobacco. She reports current alcohol use of about 15.0 standard drinks of alcohol per week. She reports that she does not use drugs.  Allergies:  Allergies  Allergen Reactions   Ampicillin Swelling and Rash   Penicillins Swelling  and Rash   Shrimp [Shellfish Allergy] Swelling   Codeine Itching   Hydrocodone  Itching    Medications: I have reviewed the patient's current medications.  Results for orders placed or performed during the hospital encounter of 02/14/24 (from the past 48 hours)  CBC with Differential     Status: Abnormal   Collection Time: 02/14/24  1:03 PM  Result Value Ref Range   WBC 7.4 4.0 - 10.5 K/uL   RBC 3.82 (L) 3.87 - 5.11 MIL/uL   Hemoglobin 11.9 (L) 12.0 - 15.0 g/dL   HCT 96.0 45.4 - 09.8 %   MCV 95.3 80.0 - 100.0 fL   MCH 31.2 26.0 - 34.0 pg   MCHC 32.7 30.0 - 36.0 g/dL   RDW 11.9 14.7 - 82.9 %   Platelets 323 150 - 400 K/uL   nRBC 0.0 0.0 - 0.2 %   Neutrophils Relative % 56 %   Neutro Abs 4.1 1.7 - 7.7 K/uL   Lymphocytes Relative 34 %   Lymphs Abs 2.5 0.7 - 4.0 K/uL   Monocytes Relative 8 %   Monocytes Absolute 0.6 0.1 - 1.0 K/uL   Eosinophils Relative 1 %   Eosinophils Absolute 0.1 0.0 - 0.5 K/uL   Basophils Relative 1 %   Basophils Absolute 0.0 0.0 -  0.1 K/uL   Immature Granulocytes 0 %   Abs Immature Granulocytes 0.02 0.00 - 0.07 K/uL    Comment: Performed at [facility], [address], [place], [place] [address]  Basic metabolic panel     Status: Abnormal   Collection Time: 02/14/24  1:03 PM  Result Value Ref Range   Sodium 137 135 - 145 mmol/L   Potassium 4.0 3.5 - 5.1 mmol/L   Chloride 105 98 - 111 mmol/L   CO2 24 22 - 32 mmol/L   Glucose, Bld 103 (H) 70 - 99 mg/dL    Comment: Glucose reference range applies only to samples taken after fasting for at least 8 hours.   BUN 12 6 - 20 mg/dL   Creatinine, Ser 0.10 (H) 0.44 - 1.00 mg/dL   Calcium  9.1 8.9 - 10.3 mg/dL   GFR, Estimated 60 (L) >60 mL/min    Comment: (NOTE) Calculated using the CKD-EPI Creatinine Equation (2021)    Anion gap 8 5 - 15    Comment: Performed at [facility], [address] [place], [place] [address]  Sedimentation rate     Status: None   Collection Time: 02/14/24  1:03 PM   Result Value Ref Range   Sed Rate 10 0 - 22 mm/hr    Comment: Performed at [facility], [address], [place], [place] [address]  C-reactive protein     Status: Abnormal   Collection Time: 02/14/24  1:03 PM  Result Value Ref Range   CRP 2.0 (H) <1.0 mg/dL    Comment: Performed at [facility], [address] [place], [place] [address]  Blood culture (routine x 2)     Status: None (Preliminary result)   Collection Time: 02/14/24  2:45 PM   Specimen: BLOOD  Result Value Ref Range   Specimen Description BLOOD LEFT ANTECUBITAL    Special Requests      BOTTLES DRAWN AEROBIC AND ANAEROBIC Blood Culture adequate volume Performed at [facility], [address] [place], [place] [address]    Culture PENDING    Report Status PENDING     DG Foot Complete Right Result Date: 02/14/2024 CLINICAL DATA:  Pain and discharge from pinky toe beginning 1 week ago EXAM: RIGHT FOOT COMPLETE - 3+ VIEW COMPARISON:  02/09/2020 FINDINGS: No acute fracture or dislocation. No osseous destruction. Soft tissue swelling about the lateral forefoot including the fourth and fifth digits. Small foci of soft tissue gas identified volarly between the fourth and fifth proximal phalanges. IMPRESSION: Soft tissue swelling centered about the fifth and less so fourth digits with foci of soft tissue gas, suspicious for gas producing infection. No plain film evidence of osteomyelitis. Electronically Signed   By: Lore Rode M.D.   On: 02/14/2024 12:14    Review of Systems  Constitutional:  Negative for appetite change, chills and fever.  Respiratory:  Negative for cough and shortness of breath.   Cardiovascular:  Negative for chest pain and palpitations.  Gastrointestinal:  Positive for diarrhea. Negative for nausea and vomiting.  Musculoskeletal:  Positive for arthralgias and joint swelling.   Blood pressure 137/84, pulse 69, temperature 98.5 F (36.9 C), temperature source Oral, resp. rate 18, height  4\' 10"  (1.473 m), weight 54.9 kg, SpO2 100%. Physical Exam Constitutional:      General: She is not in acute distress.    Appearance: Normal appearance. She is normal weight. She is not ill-appearing.  HENT:  Head: Normocephalic and atraumatic.  Eyes:     Extraocular Movements: Extraocular movements intact.  Cardiovascular:     Rate and Rhythm: Normal rate.     Pulses: Normal pulses.  Pulmonary:     Effort: Pulmonary effort is normal. No respiratory distress.  Musculoskeletal:     Cervical back: Normal range of motion.     Comments: Erythema and swelling at the base of the fourth and fifth toes with extension into the fifth toe. Small open wound in the crease of the 4th and 5th toes with purulent discharge. Significant tenderness to palpation about the fifth toe and metatarsal head.   Neurological:     General: No focal deficit present.     Mental Status: She is alert and oriented to person, place, and time.  Psychiatric:        Mood and Affect: Mood normal.        Behavior: Behavior normal.     Assessment/Plan: Right fifth, possible fourth toe infection, possible osteomyelitis Awaiting CT results. Patient admitted to medicine. Started on ceftriaxone, flagyl , and vanc. Possible that depending on findings on CT that infection may clear with IV abx. Will have Dr Hulda Mage or Dr Julio Ohm consult with the patient after review of CT to determine possible need for surgery. Patient does not need to be NPO tonight.   Mehtaab Mayeda L. [place], PA-C 02/14/2024, 8:17 PM

## 2024-02-14 NOTE — Plan of Care (Signed)

## 2024-02-14 NOTE — Assessment & Plan Note (Addendum)
 History of HIV well-controlled on Biktarvy .  Imaging obtained by ED with evidence of gas, additionally patient does have fluctuance without frank crepitus. Concern for necrotizing infection. Will continue IV antibiotics, obtain further imaging, but anticipate patient will need orthopedic consult. Patient VSS, not concerned for systemic infection at this time. - Admit to FMTS, attending Dr. Drue Gerald - med surg, Vital signs per floor - PT/OT to treat - Orthopedics consulted and will see patient, greatly appreciate recommendations. - Continue IV antibiotics: CTX daily, Flagyl  q12, vancomycin daily - pain regimen: Tylenol  q6h PRN, oxycodone  5 mg q4h PRN - Stat right foot CT for further evaluation - AM CBC, BMP, CD4, HIV RNA - follow up blood cultures - Fall precautions

## 2024-02-14 NOTE — Plan of Care (Signed)
 FMTS Interim Progress Note  S: Pain better controlled at this time. Eating and drinking well. Has not walked on her R foot, walks on her heel.   O: BP 137/84 (BP Location: Right Arm)   Pulse 69   Temp 98.5 F (36.9 C) (Oral)   Resp 18   Ht 4\' 10"  (1.473 m)   Wt 54.9 kg   SpO2 100%   BMI 25.29 kg/m    General: Well-appearing. Resting comfortably in room. CV: Normal S1/S2. No extra heart sounds. Warm and well-perfused. Pulm: Breathing comfortably on room air. CTAB anteriorly. No increased WOB. Abd: Soft, non-tender, non-distended. Skin:  Warm, edematous, erythematous R 5th toe > 4th toe with surrounding edema of foot. Decreased sensation of R toe, tender to touch.   A/P: Toe infection Per ortho, pending CT results to determine treatment course of operative vs non-operative. CT read with soft tissue wound/ulceration along dorsal fourth/fifth interspace, with fluid collection of fifth digit concerning for osteomyelitics vs septic joint. Patient afebrile at this time.  - Cont CTX, Flagyl , Vanc - Follow Ortho, podiatry recs as they become available   Continue treatment plan as otherwise indicated in FMTS H&P.   Carey Chapman, MD 02/14/2024, 7:05 PM PGY-1, [facility] pager [phone]

## 2024-02-14 NOTE — Hospital Course (Addendum)
 Michelle Waters is a 53 y.o.female with a history of HIV well-controlled on Biktarvy , GERD, HLD, MDD who was admitted to the [facility] Medicine Teaching Service at [facility] for right foot cellulitis.   Her hospital course is detailed below:  Cellulitis of right foot Patient started on broad-spectrum antibiotics for gas producing cellulitis of fifth digit on right foot.  CT imaging obtained showed soft tissue wound/ulceration along the dorsal 4th/5th interspace with fluid collection adjacent to the fifth digit raising concern for osteomyelitis versus septic joint at the MTP joint.  Blood cultures were collected prior to starting antibiotics and these remained negative.  Orthopedic surgery was consulted and did not recommend surgery. IV antibiotics continued as below.  Infectious Disease was consulted to weigh in on antibiotic regimen.  They recommended MRI which showed osteomyelitis, but fortunately no evidence of septic joint.  ID recommended a 4-week course of PO cefadroxil  and doxycycline , and the patient was discharged with this regimen.  Antibiotic course: IV Vancomycin  (4/27-5/1) IV Ceftriaxone  (4/27-5/1) IV Metronidazole  (4/27-5/1)  Alcohol use Patient endorses 40 ounces of beer every other day, no history of alcohol withdrawal.  Started daily MVI, thiamine  and folic acid .  CIWA's without Ativan  remained stable and patient did not have withdrawal.   Other chronic conditions were medically managed with home medications and formulary alternatives as necessary (HIV, GERD, depression)   PCP Follow-up Recommendations: Ensure good antibiotic compliance without worsening of infection.

## 2024-02-14 NOTE — ED Provider Notes (Signed)
 [facility] GENERAL MED/SURG UNIT Provider Note  CSN: [id number] Arrival date & time: 02/14/24 1115  Chief Complaint(s) Foot Pain  HPI Michelle Waters is a 53 y.o. female with past medical history as below, significant for HIV, MDD, depression, tobacco use who presents to the ED with complaint of right foot wound  Patient reports that she pulled a small piece of skin off of her toe around 10 days ago.  Over the past week she has had worsening pain and swelling to her right foot primarily the 4th and 5th digits.  Difficulty walking secondary to pain.  No some purulent drainage over the past 24 hours.  Pain and swelling is worsened over the past 24 hours.  No fevers, chills, nausea or vomiting.    She follows with infectious disease, well-controlled HIV.  Compliant w/ [employer]   Past Medical History Past Medical History:  Diagnosis Date   Acid reflux    Elevated serum creatinine 10/05/2017   HIV (human immunodeficiency virus infection) (HCC)    Hypertension    Patient Active Problem List   Diagnosis Date Noted   Chronic health problem 02/14/2024   Cellulitis 02/14/2024   Alcohol use 02/14/2024   Major depressive disorder, recurrent episode, moderate (HCC) 01/18/2024   GAD (generalized anxiety disorder) 01/18/2024   GERD without esophagitis 10/16/2020   Cervical dysplasia 09/28/2020   Low grade squamous intraepithelial lesion on cytologic smear of cervix (LGSIL) 04/02/2020   Right foot pain 02/15/2020   Early syphilis, 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 Authorizing Provider  acetaminophen  (TYLENOL ) 500 MG tablet Take 1,000 mg by mouth every 6 (six) hours as needed for moderate pain.    Yes [provider]   albuterol  (PROVENTIL  HFA;VENTOLIN  HFA) 108 (90 Base) MCG/ACT inhaler Inhale 2 puffs into the lungs every 6 (six) hours as needed for wheezing or shortness of breath. 11/21/15  Yes Jegede, Olugbemiga E, MD  bictegravir-emtricitabine-tenofovir AF (BIKTARVY ) 50-200-25 MG TABS tablet Take 1 tablet by mouth daily 12/17/23  Yes Lawrance Presume, MD  fenofibrate  (TRICOR ) 48 MG tablet Take 1 tablet (48 mg total) by mouth daily. 11/04/23 04/02/24 Yes Calone, Gregory D, FNP  omeprazole  (PRILOSEC) 20 MG capsule Take 1 capsule (20 mg total) by mouth daily. Please schedule a yearly follow up for further refills. Thank you 12/17/23  Yes Lawrance Presume, MD  Ensure (ENSURE) Take 237 mLs by mouth 2 (two) times daily between meals. 11/03/23   Calone, Gregory D, FNP                                                                                                                                    Past  Surgical History Past Surgical History:  Procedure Laterality Date   COLPOSCOPY  09/20/2020       I & D EXTREMITY Right 08/30/2017   Procedure: IRRIGATION AND DEBRIDEMENT INDEX FINGER;  Surgeon: Winston Hawking, MD;  Location: [facility] OR;  Service: Orthopedics;  Laterality: Right;   Family History Family History  Problem Relation Age of Onset   Hypertension Mother    Diabetes Mother    Lung cancer Maternal Uncle    Lung cancer Maternal Grandmother    Rectal cancer Neg Hx    Stomach cancer Neg Hx     Social History Social History   Tobacco Use   Smoking status: Every Day    Current packs/day: 0.50    Types: Cigarettes   Smokeless tobacco: Never  Vaping Use   Vaping status: Never Used  Substance Use Topics   Alcohol use: Yes    Alcohol/week: 15.0 standard drinks of alcohol    Types: 15 Cans of beer per week   Drug use: No   Allergies Ampicillin, Penicillins, Shrimp [shellfish allergy], Codeine, and Hydrocodone   Review of Systems A thorough review of systems was obtained and all systems are negative  except as noted in the HPI and PMH.   Physical Exam Vital Signs  I have reviewed the triage vital signs BP 137/84 (BP Location: Right Arm)   Pulse 69   Temp 98.5 F (36.9 C) (Oral)   Resp 18   Ht 4\' 10"  (1.473 m)   Wt 54.9 kg   SpO2 100%   BMI 25.29 kg/m  Physical Exam Vitals and nursing note reviewed.  Constitutional:      General: She is not in acute distress.    Appearance: Normal appearance. She is well-developed. She is not ill-appearing.  HENT:     Head: Normocephalic and atraumatic.     Right Ear: External ear normal.     Left Ear: External ear normal.     Nose: Nose normal.     Mouth/Throat:     Mouth: Mucous membranes are moist.  Eyes:     General: No scleral icterus.       Right eye: No discharge.        Left eye: No discharge.  Cardiovascular:     Rate and Rhythm: Normal rate.  Pulmonary:     Effort: Pulmonary effort is normal. No respiratory distress.     Breath sounds: No stridor.  Abdominal:     General: Abdomen is flat. There is no distension.     Tenderness: There is no guarding.  Musculoskeletal:        General: No deformity.     Cervical back: No rigidity.       Feet:  Skin:    General: Skin is warm and dry.     Coloration: Skin is not cyanotic, jaundiced or pale.  Neurological:     Mental Status: She is alert and oriented to person, place, and time.     GCS: GCS eye subscore is 4. GCS verbal subscore is 5. GCS motor subscore is 6.  Psychiatric:        Speech: Speech normal.        Behavior: Behavior normal. Behavior is cooperative.        ED Results and Treatments Labs (all labs ordered are listed, but only abnormal results are displayed) Labs Reviewed  CBC WITH DIFFERENTIAL/PLATELET - Abnormal; Notable for the following components:      Result Value   RBC 3.82 (*)  Hemoglobin 11.9 (*)    All other components within normal limits  BASIC METABOLIC PANEL WITH GFR - Abnormal; Notable for the following components:   Glucose, Bld  103 (*)    Creatinine, Ser 1.10 (*)    GFR, Estimated 60 (*)    All other components within normal limits  C-REACTIVE PROTEIN - Abnormal; Notable for the following components:   CRP 2.0 (*)    All other components within normal limits  CULTURE, BLOOD (ROUTINE X 2)  CULTURE, BLOOD (ROUTINE X 2)  SEDIMENTATION RATE  HIV-1 RNA QUANT-NO REFLEX-BLD  T-HELPER CELLS (CD4) COUNT (NOT AT [facility])                                                                                                                          Radiology DG Foot Complete Right Result Date: 02/14/2024 CLINICAL DATA:  Pain and discharge from pinky toe beginning 1 week ago EXAM: RIGHT FOOT COMPLETE - 3+ VIEW COMPARISON:  02/09/2020 FINDINGS: No acute fracture or dislocation. No osseous destruction. Soft tissue swelling about the lateral forefoot including the fourth and fifth digits. Small foci of soft tissue gas identified volarly between the fourth and fifth proximal phalanges. IMPRESSION: Soft tissue swelling centered about the fifth and less so fourth digits with foci of soft tissue gas, suspicious for gas producing infection. No plain film evidence of osteomyelitis. Electronically Signed   By: Lore Rode M.D.   On: 02/14/2024 12:14    Pertinent labs & imaging results that were available during my care of the patient were reviewed by me and considered in my medical decision making (see MDM for details).  Medications Ordered in ED Medications  cefTRIAXone (ROCEPHIN) 2 g in sodium chloride  0.9 % 100 mL IVPB (0 g Intravenous Stopped 02/14/24 1453)    And  metroNIDAZOLE  (FLAGYL ) IVPB 500 mg (has no administration in time range)    And  vancomycin (VANCOCIN) IVPB 1000 mg/200 mL premix ( Intravenous Infusion Verify 02/14/24 1546)  acetaminophen  (TYLENOL ) tablet 650 mg (650 mg Oral Given 02/14/24 1538)    Or  acetaminophen  (TYLENOL ) suppository 650 mg ( Rectal See Alternative 02/14/24 1538)  oxyCODONE  (Oxy IR/ROXICODONE ) immediate  release tablet 5 mg (5 mg Oral Given 02/14/24 1538)  thiamine (VITAMIN B1) tablet 100 mg (100 mg Oral Given 02/14/24 1532)    Or  thiamine (VITAMIN B1) injection 100 mg ( Intravenous See Alternative 02/14/24 1532)  folic acid (FOLVITE) tablet 1 mg (1 mg Oral Given 02/14/24 1532)  multivitamin with minerals tablet 1 tablet (1 tablet Oral Given 02/14/24 1532)  nicotine (NICODERM CQ - dosed in mg/24 hours) patch 21 mg (21 mg Transdermal Patch Applied 02/14/24 1536)  heparin injection 5,000 Units (5,000 Units Subcutaneous Not Given 02/14/24 1537)  Oral care mouth rinse (has no administration in time range)  morphine  (PF) 4 MG/ML injection 4 mg (4 mg Intravenous Given 02/14/24 1409)  ondansetron  (ZOFRAN ) injection 4 mg (4 mg Intravenous Given 02/14/24 1408)  sodium chloride  0.9 % bolus 1,000 mL (0 mLs Intravenous Stopped 02/14/24 1523)                                                                                                                                     Procedures Procedures  (including critical care time)  Medical Decision Making / ED Course    Medical Decision Making:    Joelyn L Subia is a 53 y.o. female with past medical history as below, significant for HIV, MDD, depression, tobacco use who presents to the ED with complaint of right foot wound. The complaint involves an extensive differential diagnosis and also carries with it a high risk of complications and morbidity.  Serious etiology was considered. Ddx includes but is not limited to: osteomyelitis, cellulitis, gangrene, fb, necrotizing fasciitis,   Complete initial physical exam performed, notably the patient was in no distress, resting on stretcher .    Reviewed and confirmed nursing documentation for past medical history, family history, social history.  Vital signs reviewed.     Brief summary:  53 year old female history of HIV well-controlled, MDD, tobacco use here with right foot pain.  Concern for infection to her  right distal   Clinical Course as of 02/14/24 1555  Sun Feb 14, 2024  1403 She has cellulitis to her foot w/ abscess, concern for gas forming infection in the foot. Send cultures, start abx, plan admit  [SG]    Clinical Course User Index [SG] Teddi Favors, DO     She is not septic  She has cellulitis/abscess to her foot with gas. Start broad abx as concern for potentially severe soft tissue infection, potentially to turn necrotizing. She has HIV but appears to be well controlled.  If symptoms worsen or do not improve may require interventional orthopedics.  Admitted FMTS             Additional history obtained: -Additional history obtained from spouse -External records from outside source obtained and reviewed including: Chart review including previous notes, labs, imaging, consultation notes including  Prior ID documentation  Home meds   Lab Tests: -I ordered, reviewed, and interpreted labs.   The pertinent results include:   Labs Reviewed  CBC WITH DIFFERENTIAL/PLATELET - Abnormal; Notable for the following components:      Result Value   RBC 3.82 (*)    Hemoglobin 11.9 (*)    All other components within normal limits  BASIC METABOLIC PANEL WITH GFR - Abnormal; Notable for the following components:   Glucose, Bld 103 (*)    Creatinine, Ser 1.10 (*)    GFR, Estimated 60 (*)    All other components within normal limits  C-REACTIVE PROTEIN - Abnormal; Notable for the following components:   CRP 2.0 (*)    All other components within normal limits  CULTURE, BLOOD (ROUTINE X 2)  CULTURE, BLOOD (ROUTINE X 2)  SEDIMENTATION RATE  HIV-1 RNA  QUANT-NO REFLEX-BLD  T-HELPER CELLS (CD4) COUNT (NOT AT [facility])    Notable for CRP mild elev  EKG   EKG Interpretation Date/Time:    Ventricular Rate:    PR Interval:    QRS Duration:    QT Interval:    QTC Calculation:   R Axis:      Text Interpretation:           Imaging Studies ordered: I ordered imaging  studies including xr foot I independently visualized the following imaging with scope of interpretation limited to determining acute life threatening conditions related to emergency care; findings noted above I agree with the radiologist interpretation If any imaging was obtained with contrast I closely monitored patient for any possible adverse reaction a/w contrast administration in the emergency department   Medicines ordered and prescription drug management: Meds ordered this encounter  Medications   AND Linked Order Group    cefTRIAXone (ROCEPHIN) 2 g in sodium chloride  0.9 % 100 mL IVPB     Antibiotic Indication::   Other Indication (list below)     Other Indication::   Wound infection    metroNIDAZOLE  (FLAGYL ) IVPB 500 mg     Antibiotic Indication::   Other Indication (list below)     Other Indication::   Wound infection    vancomycin (VANCOCIN) IVPB 1000 mg/200 mL premix     Indication::   Wound Infection   morphine  (PF) 4 MG/ML injection 4 mg   ondansetron  (ZOFRAN ) injection 4 mg   sodium chloride  0.9 % bolus 1,000 mL   DISCONTD: enoxaparin (LOVENOX) injection 40 mg   OR Linked Order Group    acetaminophen  (TYLENOL ) tablet 650 mg    acetaminophen  (TYLENOL ) suppository 650 mg   oxyCODONE  (Oxy IR/ROXICODONE ) immediate release tablet 5 mg    Refill:  0   OR Linked Order Group    thiamine (VITAMIN B1) tablet 100 mg    thiamine (VITAMIN B1) injection 100 mg   folic acid (FOLVITE) tablet 1 mg   multivitamin with minerals tablet 1 tablet   nicotine (NICODERM CQ - dosed in mg/24 hours) patch 21 mg   heparin injection 5,000 Units   Oral care mouth rinse    -I have reviewed the patients home medicines and have made adjustments as needed   Consultations Obtained: na   Cardiac Monitoring:  Continuous pulse oximetry interpreted by myself, 100% on RA.    Social Determinants of Health:  Diagnosis or treatment significantly limited by social determinants of health: current  smoker   Reevaluation: After the interventions noted above, I reevaluated the patient and found that they have improved  Co morbidities that complicate the patient evaluation  Past Medical History:  Diagnosis Date   Acid reflux    Elevated serum creatinine 10/05/2017   HIV (human immunodeficiency virus infection) (HCC)    Hypertension       Dispostion: Disposition decision including need for hospitalization was considered, and patient admitted to the hospital.    Final Clinical Impression(s) / ED Diagnoses Final diagnoses:  Cellulitis of foot        Teddi Favors, DO 02/14/24 1555

## 2024-02-14 NOTE — ED Triage Notes (Signed)
 Pt here for right foot pain, pt states it hurts on the top/bottom/ pinky toe. Discharge coming from pinky toe. Pain started a week ago.

## 2024-02-14 NOTE — Assessment & Plan Note (Addendum)
 Reports 40 ounce beer every other day.  No history of alcohol withdrawal. - CIWAs - Daily MVI, thiamine, folic acid

## 2024-02-15 DIAGNOSIS — L03031 Cellulitis of right toe: Secondary | ICD-10-CM | POA: Diagnosis not present

## 2024-02-15 LAB — BASIC METABOLIC PANEL WITH GFR
Anion gap: 7 (ref 5–15)
BUN: 10 mg/dL (ref 6–20)
CO2: 27 mmol/L (ref 22–32)
Calcium: 9.5 mg/dL (ref 8.9–10.3)
Chloride: 105 mmol/L (ref 98–111)
Creatinine, Ser: 0.97 mg/dL (ref 0.44–1.00)
GFR, Estimated: >60 mL/min (ref >60)
Glucose, Bld: 106 mg/dL — ABNORMAL HIGH (ref 70–99)
Potassium: 3.9 mmol/L (ref 3.5–5.1)
Sodium: 139 mmol/L (ref 135–145)

## 2024-02-15 LAB — CBC
HCT: 35.1 % — ABNORMAL LOW (ref 36.0–46.0)
Hemoglobin: 11.4 g/dL — ABNORMAL LOW (ref 12.0–15.0)
MCH: 30.6 pg (ref 26.0–34.0)
MCHC: 32.5 g/dL (ref 30.0–36.0)
MCV: 94.1 fL (ref 80.0–100.0)
Platelets: 306 10*3/uL (ref 150–400)
RBC: 3.73 MIL/uL — ABNORMAL LOW (ref 3.87–5.11)
RDW: 12.4 % (ref 11.5–15.5)
WBC: 5.6 10*3/uL (ref 4.0–10.5)
nRBC: 0 % (ref 0.0–0.2)

## 2024-02-15 LAB — T-HELPER CELLS (CD4) COUNT (NOT AT ARMC)
CD4 % Helper T Cell: 56 % (ref 33–65)
CD4 T Cell Abs: 1344 /uL (ref 400–1790)

## 2024-02-15 MED ORDER — VANCOMYCIN HCL IN DEXTROSE 1-5 GM/200ML-% IV SOLN: 1000.0000 mg | Freq: Once | INTRAVENOUS | Status: DC

## 2024-02-15 MED ORDER — METRONIDAZOLE 500 MG/100ML IV SOLN: 500.0000 mg | Freq: Once | INTRAVENOUS | Status: DC

## 2024-02-15 MED ORDER — METRONIDAZOLE 500 MG/100ML IV SOLN
500.0000 mg | Freq: Two times a day (BID) | INTRAVENOUS | Status: DC
  Administered 2024-02-15 – 2024-02-18 (×7): 500 mg via INTRAVENOUS
  Filled 2024-02-15 (×7): qty 100

## 2024-02-15 MED ORDER — ONDANSETRON 4 MG PO TBDP
4.0000 mg | ORAL_TABLET | Freq: Three times a day (TID) | ORAL | Status: DC | PRN
  Administered 2024-02-15: 4 mg via ORAL
  Filled 2024-02-15: qty 1

## 2024-02-15 MED ORDER — SODIUM CHLORIDE 0.9 % IV SOLN
2.0000 g | INTRAVENOUS | Status: DC
  Administered 2024-02-15 – 2024-02-17 (×3): 2 g via INTRAVENOUS
  Filled 2024-02-15 (×3): qty 20

## 2024-02-15 MED ORDER — VANCOMYCIN HCL 750 MG/150ML IV SOLN
750.0000 mg | INTRAVENOUS | Status: DC
  Administered 2024-02-15 – 2024-02-17 (×3): 750 mg via INTRAVENOUS
  Filled 2024-02-15 (×4): qty 150

## 2024-02-15 MED ORDER — SODIUM CHLORIDE 0.9 % IV SOLN: 2.0000 g | Freq: Once | INTRAVENOUS | Status: DC

## 2024-02-15 MED ORDER — SERTRALINE HCL 25 MG PO TABS
50.0000 mg | ORAL_TABLET | Freq: Every day | ORAL | Status: DC
  Administered 2024-02-15 – 2024-02-18 (×4): 50 mg via ORAL
  Filled 2024-02-15 (×4): qty 2

## 2024-02-15 NOTE — Discharge Instructions (Addendum)
 Dear Michelle Waters,   Thank you for letting us  participate in your care!  We are so glad you are feeling better.  You were hospitalized because of an infection in your foot.  Orthopedic surgery saw you in case you needed surgery, but fortunately this was not required.  You got IV antibiotics while in the hospital to help with your infection, and it is very important that you continue to take your antibiotics as prescribed once you get home.  Cefadroxil : Take 1 tablet two times a day (morning and night) for 4 weeks. Doxycycline : Take 1 tablet two times a day (morning and night) for 4 weeks. Do not stop taking either of these antibiotics early unless you are instructed to do so by your doctor.  Your last dose will be Thursday 5/29.  Please wear your boot when you are walking around. You do knot have to wear this when you are sitting or when you are sleeping at night. Please also use your crutches to keep pressure off of your foot. Talk to your primary doctor about how long you should use these items if you have any questions.   POST-HOSPITAL & CARE INSTRUCTIONS We recommend following up with your PCP within 1 week from being discharged from the hospital. Please let PCP/Specialists know of any changes in medications that were made which you will be able to see in the medications section of this packet.  DOCTOR'S APPOINTMENTS & FOLLOW UP Future Appointments  Date Time Provider Department Center  02/26/2024  9:30 AM Shery Done, MD [facility] None  03/01/2024  9:45 AM Bubba Carbo, FNP [facility]  03/28/2024  8:55 AM Kiki Pelton, MD [facility]  06/16/2024  8:50 AM Lawrance Presume, MD [facility]CH[facility] None     Thank you for choosing [facility]! Take care and be well!  Family Medicine Teaching Service Inpatient Team [place]  [facility]  [address] [place], [place] [address] [phone]    Rene Carrier [facility] Address: [address] [place], [place] [address] Phone Number: [phone] Hours of Operation: Residents of [place] can come to obtain food Monday through Friday from 8:30am until 3:30pm. Photo ID and Social Security cards required for all residents of a household. Can come six times a year  [facility] Address: [address] [place], [place] [address] Phone Number: [phone] Hours of Operation: Operates Tuesday-Friday 10:00 a.m. to 1 p.m. Requirements: Referral from DSS needed. May come 6 times a year, 30 days apart. Photo ID and SS required for all residents of household.  [facility] Address: [address] [place], [place] [address] Phone Number: [phone] Hours of Operation: Food pantry is open on the last Saturday of each month from 10:00 am - 12:00 noon. No appointment needed. No qualifications.  [facility] Address: [address] [place], [place] [address] Phone Number: [phone]. [phone] Hours of Operation: Must make reservations to pick up food on Saturdays. Sign ups for Saturday pick up beginning at 8:30 a.m. on Monday morning.  [facility] Address: [address] [place], [place] [address] Phone Number: [phone] Hours of Operation: If you need food, bring proper identification such as a driver's license to receive a bag of food once a month. Requirements: Can come once every 30 days with referral DSS, Holiday representative, Mental health etc. Each referral good for six visits. Photo ID required. *1st visit no referral required.  [place]  [facility] Address: [address], [place] [address] Phone Number: [phone]  [place] [facility] Address: [address] [place], [place] [address] Phone Number: [phone] Hours of Operation:  You can register at [email or web address] for free groceries  [facility] Address: [address], [place] [address] Phone  Number: [phone] Hours of Operation: Drive through giveaway, first come first served. Every 3rd Saturday 11AM - 1PM  [facility] Address: [address] [place] [address] Phone Number: [phone]   [place]  HAND [facility] HAND [facility]OD PANTRY Address: [address] [place], [place] [address] Phone Number: [phone] Hours of Operation: Once a month every 3rd Saturday  [facility] Address: [address] [place], [place] [address] Phone Number: [phone] Hours of Operation: Distribution happens from 9:00-10:00 a.m. every Saturday.     [facility] Address: [address] [place], [place] [address] Phone Number: [phone] Hours of Operation: ONCE a week for the community food distribution held every Tuesday, Wednesday and Thursday from 11 a.m. - 2:00 p.m. Food is available on a first come, first serve basis and varies week to week. No appointment necessary for drive thru pick up.  [facility] Address: [address] [place], [place] [address] Phone Number: [phone] Hours of Operation: Open every 3rd Thursday 9:30 a.m. - 11:00 a.m.  [facility] Address: [address] [place], [place] [address] Phone Number: [phone] Hours of Operation: Please call for hours, directions, and questions  [facility] [place] [facility] Address: [address] [place], [place]  [address] Phone Number: [phone] Website: https://www.Hollyguns.co.za Food Finder app: https://findfood.ghpfa.org  [employer] Address: [address] [place], [place] [address] Phone Number: [phone] Hours of Operation: Contact Bree Harpe. Enrolled Substance Abuse Clients Only  [facility] Address: [address] [place], [address]  Phone Number: [phone] Hours of Operation: Contact Alene Ana. Food pantry open the 3rd Saturday of each month from 9 a.m. -12 p.m. only  [place] [facility] Address:  [address] [place], [place] [address] Phone Number: [phone] Hours of Operation: Contact Loletta Ripple. Emergency food bank open on Saturdays by appointment only  [facility] Address: [address] [place], [place] [address] Phone Number: [phone] Hours of Operation: No specific contact person; Anyone can help  [facility] Address: [address] [place], [place] [address] Phone Number: [phone] Hours of Operation: Contact Julia Oats. Agency gives out a bag of food every Thursday from 2-4 p.m. only, and also provides a community meal every Thursday between 5-6 p.m. Other services provided include rent/mortgage and utility assistance, women's winter shelter, thrift store, and senior adult activities.  [facility] [place] Address: [address] [place], [place] [address] Phone Number: [phone] Hours of Operation: The Emergency Food Assistance Program provides individuals and families with a generous supply of food including meat, fresh vegetables, and nonperishable items. The food box contains five days' worth of food, and each family or individual can receive a box once per month. M, W, Th, Fr 11am-2pm, walk-ins welcome.  [facility] Address: [address] [place], [place] [address]  Phone Number: [phone] Hours of Operation: Contact Asia Blanca Bunch. Tuesdays and Thursdays from 11am - 3pm by appointment only  Sunday, by APPOINTMENT ONLY  [facility], [address], [address], [phone], 3.2 mi from Center  [facility], call in advance for appointment at 10:00am or at 4:00pm, must provide valid photo ID  Monday  9:30am-5:00pm [facility], [address] [place] [phone], [phone], 0.9 mi from [facility], can come four times per year, bring your photo ID and SS cards for other residents of household, will make appointments for those who work  and need to come after 5pm  10:00am-12:00noon [employer], [address] [place]  [place], [address], [phone], 1.7 mi from [facility], can come once every 60 days per household, need referral from DSS, [employer], etc., bring photo ID and SS card   10:00am-1:00pm [employer] [facility],  [address] [place], [address], [phone], 7.7 mi from [facility], can come once every thirty days with a referral from DSS, [employer], Mental Health, etc. -- each referral good for six visits, bring photo ID   10:00am-1:00pm [address], [address], [address], [phone], 4.2 mi from [facility], can come once every 6 months, open to [facility] residents, bring photo ID and copy of a current utility bill in your name, please call first to verify that food is available  6:30pm-8:30pm [facility], [address], [address], [phone], 3.2 mi from [facility], can come once every 30 days, maximum 6 times per year, bring your photo ID and SS numbers for other residents of household  Monday by APPOINTMENT ONLY  [facility], [address], [address],  [phone], 2.5 mi from [facility], call in advance for appointment between 10:00am-2:00pm, bring your photo ID and SS cards for all residents of household, can come once every 3 months  [facility], [address], [address], [phone], 0.7 mi from [facility], call in advance for appointment, can come once every 30 days, bring your photo ID and SS cards for other residents of household   Tuesday  9:00am-12:00noon [employer], [address] [address], [phone], 1.3 mi from [facility], can come once every 3 months, bring your photo ID and SS numbers for other residents of household   9:00am-1:00pm [facility] [address],  [place], [address], [phone], 1.6 mi from [facility],  can come once every two weeks  9:30am-5:00pm [employer], [address] [place] [phone], [phone], 0.9 mi from [facility], can come four times per year, bring your photo ID and SS cards for other residents of household, will make appointments for those who work and need to come after 5pm  10:00am-12:00noon [employer], [address] [place], [address], [phone], 1.7 mi from [facility], can come once every 60 days per household, need referral from DSS, [employer], etc., bring photo ID and SS card  10:00am-1:00pm [facility], [id number],  [phone], 3.8 mi from [facility], with referral from DSS, may come six times, 30 days apart, bring your photo ID and SS cards for all residents of household   10:00am-1:00pm [address] the [facility], [address] [place], [address], [phone], 7.7 mi from [facility], can come once every thirty days with a referral from DSS, [employer], Mental Health, etc.- each referral good for six visits, bring photo ID   10:00am-1:00pm [facility]  [address], [address] [address], [phone], 4.2 mi from [facility], can come once every 6 months, open to [facility] residents, bring photo ID and copy of a current utility bill in your name, please call first to verify that food is available  2:00pm-3:30pm [facility], [address], [phone], [phone], 3.7 mi from [facility], can come twelve times per year, one bag per family, bring photo ID   FIRST AND THIRD Tuesdays  10:00am-1:00pm, [address], [address], [address], [phone], 7.2 mi from [facility], can come once every 30 days   Tuesday, WHEN FOOD IS AVAILABLE (call)  12:00noon-2:00pm [facility], [address], [address], [phone], 1.5 mi from  [facility],  can come once every 30 days, bring photo ID   Tuesday, by APPOINTMENT ONLY  [facility], [address] [place], [address],  [phone], 2.5 mi from [facility], call in advance for appointment between 1:00pm-4:00pm, bring your photo ID and SS cards for all residents of household, can come once every 3 months   [address] of [place], [address] [address], [phone], 5 mi from [facility], call one day ahead for appointment the next day between 10:00am and 12:00noon, can come once every 3 months, bring your photo ID and must qualify according to family income   [facility], [address], [address], [phone], 0.7 mi from [facility], call in advance for appointment, can come once every 30 days, bring your photo ID and SS cards for other residents of household   [address] of [place], [address], [address],  [phone], 5.1 mi from [facility], call between 9:00am and 1:00pm M-F to make appointment. Appointments are scheduled for Tues and Thurs from 10:00am-11:30am, bring photo ID, can come once every 6 months, limit three visits over 18 months, then must have referral  Wednesday  9:30am-5:00pm [facility], [address] [place] [phone], [phone], 0.9 mi from [facility], can come four times per year, bring your photo ID and SS cards for other residents of household, will make appointments for those who work and need to come after 5pm  9:30am-11:30am [employer] [facility], [address], [address], [phone], 6.6 mi from [facility], can come once every 30 days, bring your photo ID, and SS cards for other residents of household, each monthly visit requires a written referral from GUM or DSS with number in household on form  10:00am-12:00noon [address]  [place], [address], [phone], 1.7 mi from [facility], can come once every 60 days per  household, need referral from DSS, [employer], etc., bring photo ID and SS card  10:00am-1:00pm [facility], [id number],  [phone], 3.9 mi from [facility], with referral from DSS, may come six times, 30 days apart, bring your photo ID and SS cards for all residents of household   10:00am-1:00pm [address] the [facility], [address] [place], [address], [phone], 7.7 mi from [facility], can come once every thirty days with a referral from DSS, [employer], Mental Health, etc. - each referral good for six visits, bring photo ID   2:00pm-5:45pm [employer], [place]  [address], [address], [phone], 3.2 mi from [facility], once every 30 days, first come/first served, limited to first 25, bring photo ID   6:30pm-8:30pm [facility], [address], [address], [phone], 3.2 mi from [facility], can come once every 30 days, maximum 6 times per year, bring your photo ID and SS numbers for other residents of household  FIRST and THIRD Wednesdays   9:00am-12:00noon, [address] [place], [address], [phone], 4.2 mi from [facility], can come once a month. Please arrive and sign in no later than 11:15 so everyone can be served by 12 noon.  THIRD Wednesday  1:30pm-3:00pm, [place] [address], [address], [address], [phone] or [phone], 2.1 mi from [facility]  Wednesday, by APPOINTMENT ONLY  [address] [facility] of [place], [address] [address], [phone], 5 mi from [facility], call one day ahead for appointment the next day between 10:00am and 12:00noon, can come once every 3 months, bring your photo ID and must qualify according to family income   [facility], [address], [address], [phone], 0.7 mi from [facility], call in advance for appointment, can come once every 30 days, bring your photo ID and SS cards for  other residents of household   [address] of [place], [address] [place], [address], [phone], 3.2 mi from [facility], call in advance for appointment at 7:00pm, must provide valid photo ID  Thursday  9:00am-12:00noon [employer], [address], [address], [phone], 1.3 mi from [facility], can come once every 3 months, bring your photo ID and SS numbers for other residents of household   9:00am-1:00pm [facility] [address]  [place], [address], [phone]/Ext [phone], 1.6 mi from [facility], can come once every two weeks  9:30am-12:00noon [employer], [address] [address], [phone], 4.5 mi from [facility], can come once every 30 days, must state income [closed Thanksgiving and week of Christmas]  9:30am-5:00pm [employer], [address] [employer] [phone], [phone], 0.9 mi from [facility], can come four times per year, bring your photo ID and SS card for other residents of household, will make appointments for those who work and need to come after 5pm  10:00am-1:00pm [employer], [address] [place], [id number],  [phone], 3.9 mi from [facility], with referral from DSS, may come six times, 30 days apart, bring your photo ID and SS cards for all residents of household   10:00am-1:00pm [address] the [facility], [address] [place], [address], [phone], 7.7 mi from [facility], can come once every thirty days with a referral from DSS, [employer], Mental Health, etc.- each referral good for six visits, bring photo ID   10:00am-1:00pm [facility], [address] [address], [phone], 4.2 mi from [facility], can come once every 6 months, open to [facility] residents, bring photo ID and copy of a current utility bill in your name, please call first to verify that food is available   SUNDAYS BREAKFAST TWO LOCATIONS: 8:00am  served in [facility] by Awaken [employer] 8:30am SHUTTLE provided from [facility], served at [employer], [address] LUNCH TWO LOCATIONS [plus one additional third Sunday only] 10:30am -  12:30pm served at [employer], [employer], [place] [address] (1.2 miles from [facility]) 12:30pm served in [place] by Land O[employer]Lakes Team (THIRD Sunday only) 1:30pm served at [facility] by [facility] one location [plus one additional third Sunday only] 5:00pm Every Sunday, served under the bridge at [address] by Lige Reeve Under the 3M Company (.7 miles from [facility]) (THIRD Sunday ONLY) 4:00pm served in the parking garage, across from [employer] Corporation, corner of [place] and [place] by Ryland Group Works Ministries MONDAYS BREAKFAST 7:30am served in [employer] by the [employer] and Friends LUNCH 10:30am - 12:30pm served at [employer], [employer], [place] [address] (1.2 miles from [facility]) DINNER TWO LOCATIONS: 7:00pm served in front of the courthouse at the corner of Goldman Sachs and Motorola. by [place] Monday Night Meal (3 blocks from [facility]) 4:30pm served at the [employer], [address] by Bank of New [place] Company (0.6 miles from [facility]) TUESDAYS BREAKFAST 8:00am - 9:00am served at The TJX Companies, [address] (0.3 miles from [place]) LUNCH 10:30am - 12:30pm served at the [employer], [employer] [address] (1.2 miles from [place]) DINNER 6:00pm served at [employer]X Corporation, enter from Capital One and go to the Sonic Automotive, (0.7 miles from [place]) [facility] BREAKFAST 7:00am - 8:00am served at [employer], [employer] [address] (1.2 miles from [place]) LUNCH ONE LOCATION [plus two  additional locations listed below] 10:30am - 12:30pm served at [employer], [employer] [address], (1.2 miles from [place]) (FIRST Wednesday ONLY) 11:30am served at [employer]illard's, [place] [address] (6.6 miles from [place]) (SECOND Wednesday ONLY) 11:00am served at [place]. [place] of [address], [address] (1.3 miles from [place]) [place] TWO LOCATIONS 6:00pm served at W. R. Berkley, [place] W. Visteon Corporation. (1.3 miles from [facility]) 4:00pm - 6:00pm (hot dogs and chips) served at Levi Strauss of [facility], [address] (1.7 miles from [place]) [place] BREAKFAST NOT AVAILABLE AT THIS TIME LUNCH 10:30am - 12:30pm served at [employer], [employer], [place] [address] (1.2 miles from [place]) DINNER 6:00pm served at [employer], enter from Capital One and go to the [employer], (0.7 miles from [employer] Corporation) [place] BREAKFAST NOT AVAILABLE AT THIS TIME LUNCH 10:30am - 12:30pm served at [employer], [employer] [address] (1.2 miles from [place]) DINNER TWO LOCATIONS, [plus one additional first Friday only] 6:00pm served under the bridge at [address] by Lige Reeve Under [employer]. (.7 miles from [facility]) 5:00pm - 7:00pm served at Levi Strauss of [facility] [place], [address] (1.7 miles from [place]) (FIRST Friday ONLY) 5:45 pm - SHUTTLE provided from the LIBRARY at 5:45pm. Served at [facility], [address] [place]. SATURDAYS BREAKFAST TWO LOCATIONS [plus one additional last Saturday only] 8:00am served at [facility] [place] by Delphi 8:30am served at [employer], [address] (2.2 miles from [facility]) (LAST Saturday ONLY) 8:30am served at [employer],  [address] (5 miles from [place]) LUNCH 10:30am -  12:30pm served at [employer], [employer] [address] (1.2 miles from [place]) DINNER 6:00pm served under the bridge at [address] by World Fuel Services Corporation (0.7 miles from N[employer]or Corporation)  DIRECTIONS FROM [place] TO ALL MEAL LOCATIONS The Bridge at [address] (.7 miles from [address]) [address] on [place]. Turn Right onto DIRECTV 433 ft. Continue onto [address] under bridge, about 500 ft. Courthouse (3 blocks from [facility]) [place] on [address]. Turn right on [address]  1 block to PPL Corporation (.5 miles from [place]) [place] on [place]. YRC Worldwide. past Brink's Company to EMCOR. Enter from Capital One and go to the Affiliated Computer Services building W. R. Berkley [address] V[address]. (1.3 miles from [facility]) [address] on [place]. Turn Right onto [address]. [address]. church will be on the Left. The TJX Companies [address] (.3 miles from [facility]) Go .3 miles on W. Friend[address] Destination is on your right Dillard's at [employer] (6.6 miles from [address]) [address] on [place] toward [address] Turn right onto [address] Friend[address] Continue onto Alcoa Inc. Continue onto Toll Brothers. 5. Latina Pol is on right Sanmina-SCI Futures trader) [address] (.6 miles from [place]) [place] on [place]. [address] Turn Left onto [address] 0.3 miles Destination is on the Left. [place] Black & Decker at American Express (5 miles from Nucor Corporation) 1. Head south on [address]. Turn right onto W [place] Turn slightly left onto [employer] Continue onto [employer] Turn right at [employer] Continue to church on right [place] Sounds of [facility] [address] (1.7 miles from [place]) [address] on  [place] 1.4 miles University becomes [place]. [place] [address] Continue 0.6 miles and church will be on theright. [place] [employer] at [address] (2.5 miles from [employer]) [place] provided from [employer] [place]] [place] on [place]. [place] toward [employer] right onto [employer] left onto [employer] Turn left onto [employer] [address] [place]  [address] (2.2 miles from [address]) [address] on [place] 1.4 miles [place] becomes [place]. [place] [address] Turn right onto [address] [place]  [address]. and church will be on the Left. [facility] [place] [employer] [address] (1.2 miles from [facility]) 1.Turn right onto [facility] 2.Turn left onto [place] 3.[place] 4.Destination is on your right [place]. [place] of [address] at [employer] (1.3 miles from [facility]) [address] on [address] Turn left onto [employer] right onto S. [place]. Continue onto [employer] Home	[place]. Turn left onto [employer]. Turn right onto [employer].  The [facility] American Inpatient Behavioral Health/Residential Substance Abuse Treatment - Adults The United Way's "[id number]" is a great source of information about community services available.  Access by dialing 2-1-1 from anywhere in [place] , or by website -  PooledIncome.pl.    (Updated 01/2016)   Crisis Assistance 24 hours a day     Services Offered       Area [employer] 24-hour crisis assistance: [phone] [place], [place]  [facility] 24-hour crisis [phone] [place], [place]  [place] 24-hour crisis assistance: [phone])300-5709 [place], [place]  [facility] Access to Care Line 24-hour crisis assistance; [phone] All  [facility] 24-hour crisis response line: (909)459-4126 All    Other Local Resources (Updated 10/2015)   Inpatient Behavioral Health/Residential Substance Abuse Treatment Programs      Services          Address and Phone Number  [facility] ([facility])   14-day residential rehabilitation  [phone] [address] [place], [place]  [facility] ([facility])    Detox - private pay only 14-day residential rehabilitation -  Medicaid, insurance, private pay only 8504464826, or [phone] [address] [place], [place] [address]   [facility] only Multiple facilities [phone] admissions    [facility] ([facility])   90-day program Must be homeless to participate   [phone], or [phone] Jayson Michael, [facility]  [facility] only [phone], or  [phone] [address] [place], [place] [address]  [facility] Residential Treatment Services Must make an appointment Accepts private pay, [place] Areola [facility] [phone]  [address] [place], [place] [address]   [facility] Females only Associated with the [facility] [phone] [address] [place], [place] [address]  [facility] only [phone], or [phone] [address] [place], [id number]  [facility]     Detox Residential rehabilitation Private insurance only Multiple locations [phone] admissions  [facility]     Private pay Private insurance [phone] [address] [place], [place] [address]  [facility]     Males only Fee required at time of admission [phone] [address] [place], [place] [address]  Path of [facility]     Private pay only   [phone] [place], [place]  RTS (Residential Treatment Services)    Detox - private pay, Medicaid Residential rehabilitation for males  - Medicare, Medicaid, insurance, private pay [phone] [address] [place], [place]   HALPF              Walk-in interviews Monday - Saturday from 8 am -  4 pm Individuals with legal charges are not eligible [phone] [address] [place], [place] [address]  [facility]  Must be willing to work Must attend Alcoholics Anonymous meetings [phone] [address] [place], [place]   [facility] [employer]    Faith-based program Private pay only [phone] [address] [place], [place]    In a time of Crisis:   - [facility]   Mobile Crisis Management provides immediate crisis response, 24/7.   Call [phone]   [facility] for MH/DD/SA Services?Access Center is available 24 hours a day, 7 days a week. Customer Service Specialists will assist you to find a crisis provider that is well-matched with your needs. Your local number is:?[phone]   - [facility])  OP, individual counseling, medication management  [address] [place], [place] [address]  ; [phone]  Call for intake hours; Medicaid and Uninsured     Outpatient Providers    [facility] ([facility])  Group and individual counseling.  [address]   [place], [place] [address]  [phone]  [place]: [phone]  [place]: [phone]  Medicaid and uninsured.     [facility]  Offers IOP groups multiple times  per week.  [address] Zada Herrlich [place], [place] [address]  [phone]  Takes Medicaid and other insurances.     [facility] Out[facility]   Chemical Dependency Intensive Outpatient Program (IOP)  [address]  [place], [place] [address]  [phone]  Takes Nurse, learning disability and [place].     [facility]   IOP and Partial Hospitalization Program   [address]   [place], [place] [address]  [phone]  Private Insurance, [place] only for partial hospitalization    [facility] [place]  [address], [place], [place] [address]  [phone]   Monday-Friday. Short and Long term options.  [facility]/Behavioral Health Urgent Care (BHUC)  IOP, individual counseling, medication management  [address] [place], [place] [address]  [phone]  Medicaid and [facility]    [facility]  [address]   [place], [place] [address]  [phone]  Private Insurance and Self Pay     [facility] Outpatient  [address]   [place], [place] [address]  [phone]  Private Insurance, [place], and Self Pay     [facility]one [facility]   [address] [place], [place] [address]  [facility]   [address]   [place], [place] [address]  [phone]    [facility]   [address]  [place], [place] [address]  [phone]          Residential Treatment Programs    [facility] ([facility]  [address]  [place], [place] [address]  [phone] or [phone]  Detox and Residential Rehab 21 days  (Medicaid, private insurance, and self pay. If Medicare, will look into funding). No methadone. Call for pre-screen.     [facility])  [address]   [place], [place] [address]  [phone]  Detox 3-7 days (self Pay and Medicaid Limited availability). Transitional Program for females needs 60 days clean first.   Rehab Only for Males (Medicare, Medicaid, and Self Pay)-No methadone.    [facility]ship [address]  [address]  [place], [place] [address]  [phone] or [phone]  Private Insurance only    [facility]  PHONE:?[phone]  FAX:?[phone]  Residential program for women 21 and over for up to a year through a Christian 12-step recovery model.  Self-pay.      [facility]  [address] [place], [place] [address]  Phone: ?[phone]  Must be detoxed 72 hours prior to admission; 28 day program.   Self-pay.    [facility]  [address]    [place], [place]  [phone]  [employer], Medicare, [place] (not straight [place]). They offer assistance with transportation.     [facility]  [address] [place],   [place], [place] [address]  [phone]  Christian Based Program. Men only.  No insurance    [facility] is a substance use disorder treatment program for women, including those who are pregnant, parenting, and/or whose lives have been touched by abuse and violence.  [phone]) [phone]    [facility]  [address] [place], [place] [address]  Women's: [phone]  Men's: [phone]  No Medicaid.     [facility]  Locations across the [place] (mainly [place] ) willing to help  with transportation.   [phone] [employer].  [facility] Residential Treatment Facility   [address] [place]   [place], [place] [address]  [phone]  Treatment Only, must make assessment appointment, and must be sober for assessment appointment. Self pay, [facility], must be [facility] resident. No methadone.     [facility]   [address]  [place], [place] [address]  [phone]  No pending legal charges, Long-term work program. No methadone. Call for assessment.    [facility]   [address]  [place], [place] [address]  [phone] or [phone]  Commercial Insurance Only    [facility]  Local - [phone]  [phone]  Private Insurance (no [place]).  Males/Females, call to make referrals, multiple facilities.     [facility]  [address] [place] [address],   [place], [place] [address]  ?([place]  Men Only Upfront Fee  [facility]  Women's Program: [facility]  [address]  [place], [place] [address]  [phone]    [facility]-no methadone:  [facility]  [address]  [place], [place] [address]  [phone] (f)    Women's  Campus  [address] [place], [place] [address]  [phone] [facility]  [phone]  [place], [place]  For women, houses 8 residents for sober living. No Medicaid.               AA Meetings   Meeting Locator:  [email or web address]  Also can download an app on that website.    Syringe Services Program   Due to COVID-19, syringe services programs are likely operating under different hours with limited or no fixed site hours. Some programs may not be operating at all. Please contact the program directly using the phone numbers provided below to see if they are still operating under COVID-19.   [facility] Solution to the Opioid Problem (GCSTOP)  Fixed; mobile; peer-based  Amos Balint  [phone]  [email or web address]   Fixed site exchange at [facility], [address]  [place], [place] [address] on Wednesdays (2:00 - 5:00 pm) and Thursdays (4:00 - 8:00 pm).  Pop-up mobile exchange locations:  [employer] and [employer], [address] [place], [place] [address] on Tuesdays (11:00 am - 1:00 pm) and Fridays (11:00 am - 1:00 pm)   -[facility]?-?[address], [place], [place] [address] on Tuesdays (2:00 - 4:00 pm) and Fridays (2:00 - 4:00 pm)   -[facility]?- also serves [place] and [employer]  [place] [employer]  Fixed; mobile; peer-based  Adel Aden  [phone]  [email or web address]  [address] [place], [place] [address]  Delivery and outreach available in [place] and [place], please call for more information. Monday, Tuesday: 1:00 -7:00 pm  Thursday: 4:00 pm - 8:00 pm  Friday: 1:00 pm - 8:00 pm)   Medication-Assisted Treatment (MAT)   -[place]- services [address] and surrounding areas including [place], [place], [place], [place], [address], [place], [place], [place],  [place], and [place], [place].   Options include Methadone, buprenorphine or Suboxone.   [address] [place]  [place], [place] [address]  Phone: [phone]   Mon - Fri: 5:30am - 2:00pm  Sat: 5:30am -7:30am  Sun: Closed  Holidays: 6:00am - 8:00am    -[facility] [place]- We use FDA-approved medications, like methadone/suboxone/sublocade, and  vivitrol. These medications are then combined with customized care plans that include individual or group counseling, toxicology, and medical care directed by on-site physicians. Accepts most insurance plans, Medicaid, and private pay.    [address]  [place], [place] [address]  Phone:?[phone]   Monday-Friday 5:00 AM - 10:00 AM  Saturday  6:00 AM - 8:30 AM  Sunday 6:00 AM - 7:00 AM    -[facility]- [facility]S is a treatment & recovery focused program. In addition to receiving methadone medication, our clients participate in individual and group counseling as well as random drug testing. If accepted into the [facility] Opioid Program, you will be provided several intake appointments and a physical exam   [address]  [place], [place] [address]  Office: [phone]  Fax: [phone]     -[facility]- We put our community members at the center of everything we do, for remote treatment services as well as in-person, from alcohol withdrawal to opioid use and more.?   [address], [place], [place] [address]  [phone]   Monday-Wednesday:?9:00am - 5:00pm  Thursday:?9:00am - 6:00pm  Friday:?9:00am - 5:00pm  Saturday:?9:00am - 1:00pm  Sunday:?Closed      -[facility] [place]- Our clinic in [place], a medication unit, offers daily dosing of methadone or buprenorphine in addition to our clinic in [place] offering counseling to help people overcome addiction to heroin and other opiates. We also offer psychiatric services including medication management, and an office based  suboxone program.   [address], [place], [place] [address]  Phone [phone]    [facility] Internal Medicine-We treat Opioid Addiction using medications that are a combination of buprenorphine and naloxone, which are used to treat adults addicted to narcotic painkillers and drugs such as heroin. They reduce the intense cravings and painful symptoms that accompany withdrawal.   [address] [place], [place]  Phone: [phone]    [facility])  [address] [place], [place] [address]  [phone]   [place]  [phone]  [address] [place], [place] [address]   M-W??? 5:00am-12:00pm  Thu???? 5:00am-10:00am  Fri?????? 5:00am-12:00pm  Sat????? 5:00am-8:00am  Sun???? Closed   $12/daily for Methadone Treatment.

## 2024-02-15 NOTE — Assessment & Plan Note (Addendum)
 HIV-Biktarvy  daily; CD4, HIV RNA in process GERD-Protonix  40 mg daily HLD-fenofibrate  54 mg daily Depression - home sertraline  50 mg daily Current smoker-approximately 1 PPD; nicotine patch

## 2024-02-15 NOTE — Evaluation (Signed)
 Physical Therapy Evaluation  Patient Details Name: Michelle Waters MRN: [id number] DOB: 09-12-1971 Today's Date: 02/15/2024  History of Present Illness  Pt is a 53 y/o F presenting to ED on 02/14/24 wtih R foot pain, found to have possible ostemyelitis in R 4th/5th toes on CT. PMH Includes HIV, MDD, depression, tobacco use  Clinical Impression  Pt admitted with above diagnosis. Pt currently with functional limitations due to the deficits listed below (see PT Problem List). At the time of PT eval pt was able to perform transfers and ambulation with modified independence to CGA and no AD. Pt prefers no AD however RW may be helpful to off-weight RLE for pain control. Pt reports she does not want to become dependent on it. Will continue to follow acutely and reassess if pt ends up having surgery. As of now, no post-acute PT follow up recommended. Pt will benefit from acute skilled PT to increase their independence and safety with mobility to allow discharge.           If plan is discharge home, recommend the following: A little help with walking and/or transfers;Assistance with cooking/housework;Assist for transportation;Help with stairs or ramp for entrance   Can travel by private vehicle        Equipment Recommendations None recommended by PT  Recommendations for Other Services       Functional Status Assessment Patient has had a recent decline in their functional status and demonstrates the ability to make significant improvements in function in a reasonable and predictable amount of time.     Precautions / Restrictions Precautions Precautions: Fall Recall of Precautions/Restrictions: Intact Restrictions Weight Bearing Restrictions Per Provider Order: No      Mobility  Bed Mobility Overal bed mobility: Independent                  Transfers Overall transfer level: Needs assistance Equipment used: Rolling walker (2 wheels) Transfers: Sit to/from Stand Sit to Stand:  Contact guard assist           General transfer comment: Light guard as pt powered up to full stand. Offered RW however pt declined. No overt LOB noted.    Ambulation/Gait Ambulation/Gait assistance: Supervision Gait Distance (Feet): 25 Feet Assistive device: None Gait Pattern/deviations: Step-to pattern, Trunk flexed Gait velocity: Decreased     General Gait Details: Weight bearing through R heel and limping around room to go from bed>bathroom>sink>bed. Encouraged RW use however pt declined. No LOB, pt reports feeling comfortable mobilizing in this way.  Stairs            Wheelchair Mobility     Tilt Bed    Modified Rankin (Stroke Patients Only)       Balance Overall balance assessment: Needs assistance Sitting-balance support: Feet supported       Standing balance support: During functional activity, Reliant on assistive device for balance Standing balance-Leahy Scale: Fair Standing balance comment: heel wbing on RLE mostly                             Pertinent Vitals/Pain Pain Assessment Pain Assessment: Faces Faces Pain Scale: Hurts little more Pain Location: R foot Pain Descriptors / Indicators: Discomfort Pain Intervention(s): Limited activity within patient's tolerance, Monitored during session, Repositioned    Home Living Family/patient expects to be discharged to:: Private residence Living Arrangements: Spouse/significant other Available Help at Discharge: Family;Available PRN/intermittently Type of Home: Apartment Home Access: Level entry  Home Layout: One level Home Equipment: Agricultural consultant (2 wheels)      Prior Function Prior Level of Function : Independent/Modified Independent             Mobility Comments: was using husband's rollator 1 week PTA ADLs Comments: does not drive or work     Extremity/Trunk Assessment   Upper Extremity Assessment Upper Extremity Assessment: Overall WFL for tasks assessed;RUE  deficits/detail RUE Deficits / Details: Pt reports limited AROM in R shoulder from prior injury but with functional use throughout session.    Lower Extremity Assessment Lower Extremity Assessment: RLE deficits/detail RLE Deficits / Details: Acute pain - pt self-limiting weight bearing and mostly keeping weight on the heel during mobility. RLE Sensation: WNL    Cervical / Trunk Assessment Cervical / Trunk Assessment: Normal  Communication   Communication Communication: No apparent difficulties    Cognition Arousal: Alert Behavior During Therapy: WFL for tasks assessed/performed   PT - Cognitive impairments: Safety/Judgement                       PT - Cognition Comments: Pt with decreased judgement with use of RW - wants to try and go without it "to not depend on it" Following commands: Intact       Cueing       General Comments      Exercises     Assessment/Plan    PT Assessment Patient needs continued PT services  PT Problem List Decreased strength;Decreased activity tolerance;Decreased range of motion;Decreased balance;Decreased mobility;Decreased knowledge of use of DME;Decreased safety awareness;Decreased knowledge of precautions;Pain       PT Treatment Interventions DME instruction;Gait training;Functional mobility training;Therapeutic activities;Therapeutic exercise;Balance training;Patient/family education    PT Goals (Current goals can be found in the Care Plan section)  Acute Rehab PT Goals Patient Stated Goal: Decrease pain, go home PT Goal Formulation: With patient Time For Goal Achievement: 02/22/24 Potential to Achieve Goals: Good    Frequency Min 2X/week     Co-evaluation               AM-PAC PT "6 Clicks" Mobility  Outcome Measure Help needed turning from your back to your side while in a flat bed without using bedrails?: None Help needed moving from lying on your back to sitting on the side of a flat bed without using  bedrails?: None Help needed moving to and from a bed to a chair (including a wheelchair)?: A Little Help needed standing up from a chair using your arms (e.g., wheelchair or bedside chair)?: A Little Help needed to walk in hospital room?: A Little Help needed climbing 3-5 steps with a railing? : A Little 6 Click Score: 20    End of Session   Activity Tolerance: Patient tolerated treatment well Patient left: in bed;with call bell/phone within reach;with bed alarm set (Pt requesting bed height elevated as lunch is coming) Nurse Communication: Mobility status PT Visit Diagnosis: Pain Pain - Right/Left: Right Pain - part of body: Ankle and joints of foot    Time: 1410-1426 PT Time Calculation (min) (ACUTE ONLY): 16 min   Charges:   PT Evaluation $PT Eval Low Complexity: 1 Low   PT General Charges $$ ACUTE PT VISIT: 1 Visit         Simone Dubois, PT, DPT Acute Rehabilitation Services Secure Chat Preferred Office: [phone]   Venus Ginsberg 02/15/2024, 2:44 PM

## 2024-02-15 NOTE — Assessment & Plan Note (Addendum)
 Hemoglobin stable.  Blood cultures NGTD <24hrs. CT concerning for possible osteomyelitis versus septic joint; orthopedics will evaluate for surgical indication.  VSS, no concern for systemic infection. - Orthopedics consulted, greatly appreciate recommendations. - Continue IV antibiotics: CTX daily, Flagyl  q12, vancomycin daily - pain regimen: Tylenol  q6h PRN, oxycodone  5 mg q4h PRN - PT/OT - AM CBC, BMP - Fall precautions

## 2024-02-15 NOTE — Progress Notes (Addendum)
 Pt BP 87/43 MAP (57). MD notified. MD ordered to recheck manually. Pt BP is 94/53 MAP (67) after checking manually . MD notified, no new orders given at this time.

## 2024-02-15 NOTE — Plan of Care (Signed)

## 2024-02-15 NOTE — TOC Initial Note (Addendum)
 Transition of Care [facility]) - Initial/Assessment Note    Patient Details  Name: Michelle Waters MRN: [id number] Date of Birth: [birth year]  Transition of Care [facility]) CM/SW Contact:    Michelle Och, LCSW Phone Number: 02/15/2024, 10:13 AM  Clinical Narrative:                  10:15 AM CSW introduced self and role to patient at bedside. Patient confirmed she resides at home with spouse who could provide home support if needed upon discharge. Patient stated her father would provide transportation upon discharge. Patient declined HH/SNF/DME history. CSW followed up on SDOH needs (food). Patient stated that she receives monthly food stamps and expressed interest in additional resources. CSW provided printed food resources and added them to AVS. CSW informed patient of TOC consult (substance use education/counseling) and offered resources. Patient was agreeable to receiving substance use resources. CSW added resources to patient's AVS and provided printed resources.  Expected Discharge Plan: Home/Self Care Barriers to Discharge: Continued Medical Work up   Patient Goals and CMS Choice Patient states their goals for this hospitalization and ongoing recovery are:: to return home          Expected Discharge Plan and Services       Living arrangements for the past 2 months: Apartment                                      Prior Living Arrangements/Services Living arrangements for the past 2 months: Apartment Lives with:: Spouse Patient language and need for interpreter reviewed:: Yes Do you feel safe going back to the place where you live?: Yes      Need for Family Participation in Patient Care: No (Comment) Care giver support system in place?: Yes (comment)   Criminal Activity/Legal Involvement Pertinent to Current Situation/Hospitalization: No - Comment as needed  Activities of Daily Living   ADL Screening (condition at time of admission) Independently performs ADLs?: Yes  (appropriate for developmental age) Is the patient deaf or have difficulty hearing?: No Does the patient have difficulty seeing, even when wearing glasses/contacts?: No Does the patient have difficulty concentrating, remembering, or making decisions?: No  Permission Sought/Granted Permission sought to share information with : Family Supports Permission granted to share information with : No (Contact information on chart)  Share Information with NAME: Michelle Waters     Permission granted to share info w Relationship: Mother  Permission granted to share info w Contact Information: [phone]  Emotional Assessment Appearance:: Appears stated age Attitude/Demeanor/Rapport: Engaged Affect (typically observed): Accepting, Appropriate, Adaptable, Calm, Stable, Pleasant Orientation: : Oriented to Self, Oriented to Place, Oriented to  Time, Oriented to Situation Alcohol / Substance Use: Not Applicable Psych Involvement: No (comment)  Admission diagnosis:  Cellulitis [L03.90] Cellulitis of foot [L03.119] Patient Active Problem List   Diagnosis Date Noted   Chronic health problem 02/14/2024   Cellulitis 02/14/2024   Alcohol use 02/14/2024   Major depressive disorder, recurrent episode, moderate (HCC) 01/18/2024   GAD (generalized anxiety disorder) 01/18/2024   GERD without esophagitis 10/16/2020   Cervical dysplasia 09/28/2020   Low grade squamous intraepithelial lesion on cytologic smear of cervix (LGSIL) 04/02/2020   Right foot pain 02/15/2020   Early syphilis, 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   Mild depression 11/11/2019   HIV disease (HCC) 10/05/2017   Healthcare maintenance 10/05/2017   Tobacco dependence 01/17/2015   Family history of diabetes mellitus (  DM) 01/17/2015   PCP:  Lawrance Presume, MD Pharmacy:   [facility] [phone] - [place], [place] - [address] AT [facility] [address] [place] [address] Phone: [phone] Fax:  [phone]  [facility] [address], [place] - [address] AT [facility] [address] [place] [address] Phone: [phone] Fax: [phone]     Social Drivers of Health (SDOH) Social History: SDOH Screenings   Food Insecurity: Food Insecurity Present (02/14/2024)  Housing: Low Risk  (02/14/2024)  Transportation Needs: No Transportation Needs (02/14/2024)  Utilities: Not At Risk (02/14/2024)  Alcohol Screen: Low Risk  (12/17/2023)  Depression (PHQ2-9): High Risk (01/15/2024)  Financial Resource Strain: Medium Risk (12/17/2023)  Physical Activity: Insufficiently Active (12/17/2023)  Social Connections: Moderately Isolated (02/14/2024)  Stress: Stress Concern Present (12/17/2023)  Tobacco Use: High Risk (02/14/2024)  Health Literacy: Adequate Health Literacy (12/17/2023)   SDOH Interventions:     Readmission Risk Interventions     No data to display

## 2024-02-15 NOTE — Evaluation (Signed)
 Occupational Therapy Evaluation Patient Details Name: JOSEY TREICHEL MRN: [id number] DOB: 02-06-1971 Today's Date: 02/15/2024   History of Present Illness   Pt is a 53 y/o F presenting to ED on 4/27 with R foot pain, found to have possible ostemyelitis in R 4th/5th toes. PMH Includes HIV, MDD, depression, tobacco use     Clinical Impressions Pt ind at baseline with ADLs/functional mobility, lives with spouse who she reports could also assist her at home. Pt currently needing CGA-set up A for ADLs, CGA for ambulation with and without RW (though improved with RW). Pt heel weightbearing through RLE due to pain throughout session. Pt denies orthostatic symptoms/see BP measures below. Pt presenting with impairments listed below, will follow acutely, noted need for possible surgery pending ortho. Anticipate no OT follow up needs at this time.     If plan is discharge home, recommend the following:   A little help with walking and/or transfers;A little help with bathing/dressing/bathroom;Assistance with cooking/housework;Direct supervision/assist for financial management;Direct supervision/assist for medications management;Assist for transportation;Help with stairs or ramp for entrance     Functional Status Assessment   Patient has had a recent decline in their functional status and demonstrates the ability to make significant improvements in function in a reasonable and predictable amount of time.     Equipment Recommendations   Tub/shower seat     Recommendations for Other Services   PT consult     Precautions/Restrictions   Precautions Precautions: Fall Restrictions Weight Bearing Restrictions Per Provider Order: No     Mobility Bed Mobility Overal bed mobility: Independent                  Transfers Overall transfer level: Needs assistance Equipment used: Rolling walker (2 wheels) Transfers: Sit to/from Stand Sit to Stand: Contact guard assist                   Balance Overall balance assessment: Needs assistance Sitting-balance support: Feet supported       Standing balance support: During functional activity, Reliant on assistive device for balance Standing balance-Leahy Scale: Fair Standing balance comment: heel wbing on RLE mostly                           ADL either performed or assessed with clinical judgement   ADL Overall ADL's : Needs assistance/impaired Eating/Feeding: Independent   Grooming: Set up;Sitting;Standing   Upper Body Bathing: Set up;Sitting;Standing   Lower Body Bathing: Set up;Sit to/from stand;Sitting/lateral leans   Upper Body Dressing : Set up;Sitting;Standing   Lower Body Dressing: Set up;Sit to/from stand;Sitting/lateral leans   Toilet Transfer: Contact guard assist;Ambulation;Rolling walker (2 wheels) Toilet Transfer Details (indicate cue type and reason): more stable with RW Toileting- Clothing Manipulation and Hygiene: Supervision/safety       Functional mobility during ADLs: Contact guard assist;Rolling walker (2 wheels)       Vision   Vision Assessment?: No apparent visual deficits     Perception Perception: Not tested       Praxis Praxis: Not tested       Pertinent Vitals/Pain Pain Assessment Pain Assessment: Faces Pain Score: 8  Faces Pain Scale: Hurts whole lot Pain Location: R foot Pain Descriptors / Indicators: Discomfort Pain Intervention(s): Limited activity within patient's tolerance, Monitored during session, Repositioned     Extremity/Trunk Assessment Upper Extremity Assessment Upper Extremity Assessment: Overall WFL for tasks assessed   Lower Extremity Assessment Lower Extremity Assessment: Defer to  PT evaluation   Cervical / Trunk Assessment Cervical / Trunk Assessment: Normal   Communication Communication Communication: No apparent difficulties   Cognition Arousal: Alert Behavior During Therapy: WFL for tasks  assessed/performed Cognition: No apparent impairments                               Following commands: Intact       Cueing  General Comments      VSS, see note for BP measures   Exercises     Shoulder Instructions      Home Living Family/patient expects to be discharged to:: Private residence Living Arrangements: Spouse/significant other Available Help at Discharge: Family;Available PRN/intermittently Type of Home: Apartment Home Access: Level entry     Home Layout: One level     Bathroom Shower/Tub: Tub/shower unit         Home Equipment: Agricultural consultant (2 wheels)          Prior Functioning/Environment Prior Level of Function : Independent/Modified Independent             Mobility Comments: was using husband's rollator 1 week PTA ADLs Comments: does not drive or work    OT Problem List: Decreased strength;Decreased range of motion;Decreased activity tolerance;Impaired balance (sitting and/or standing);Decreased safety awareness   OT Treatment/Interventions: Self-care/ADL training;Therapeutic exercise;Energy conservation;DME and/or AE instruction;Therapeutic activities;Patient/family education;Balance training      OT Goals(Current goals can be found in the care plan section)   Acute Rehab OT Goals Patient Stated Goal: none stated OT Goal Formulation: With patient Time For Goal Achievement: 02/29/24 Potential to Achieve Goals: Good ADL Goals Pt Will Perform Grooming: Independently;standing Pt Will Perform Tub/Shower Transfer: Tub transfer;Shower transfer;Independently;ambulating;shower seat Additional ADL Goal #1: pt will tolerate OOB activity x10 min in order to improve balance and activity tolerance for ADLs   OT Frequency:  Min 2X/week    Co-evaluation              AM-PAC OT "6 Clicks" Daily Activity     Outcome Measure Help from another person eating meals?: None Help from another person taking care of personal  grooming?: None Help from another person toileting, which includes using toliet, bedpan, or urinal?: A Little Help from another person bathing (including washing, rinsing, drying)?: A Little Help from another person to put on and taking off regular upper body clothing?: A Little Help from another person to put on and taking off regular lower body clothing?: A Little 6 Click Score: 20   End of Session Equipment Utilized During Treatment: Gait belt;Rolling walker (2 wheels) Nurse Communication: Mobility status  Activity Tolerance: Patient tolerated treatment well Patient left: in bed;with call bell/phone within reach;with bed alarm set  OT Visit Diagnosis: Unsteadiness on feet (R26.81);Other abnormalities of gait and mobility (R26.89);Muscle weakness (generalized) (M62.81)                Time: 7829-5621 OT Time Calculation (min): 18 min Charges:  OT General Charges $OT Visit: 1 Visit OT Evaluation $OT Eval Low Complexity: 1 Low  Shailynn Fong K, OTD, OTR/L SecureChat Preferred Acute Rehab [phone]   Benedict Brain Koonce 02/15/2024, 12:05 PM

## 2024-02-15 NOTE — Assessment & Plan Note (Addendum)
 Reports 40 ounce beer every other day.  No history of alcohol withdrawal.  CIWA's were not recorded overnight -RN made aware and will follow today. - CIWAs  - Daily MVI, thiamine, folic acid

## 2024-02-15 NOTE — Progress Notes (Signed)
 Daily Progress Note Intern Pager: [phone]  Patient name: Michelle Waters Medical record number: [id number] Date of birth: [birth year] Age: 53 y.o. Gender: female  Primary Care Provider: Lawrance Presume, MD Consultants: Orthopedic surgery Code Status: Full  Pt Overview and Major Events to Date:  4/27-admitted  Assessment and Plan: Michelle Waters is a 53 year old female with history of well-controlled HIV on Biktarvy , presented for gas producing cellulitis of right small toe.  Imaging concerning for osteomyelitis versus septic joint.  Continuing on IV antibiotics and awaiting surgical evaluation. Assessment & Plan Cellulitis Hemoglobin stable.  Blood cultures NGTD <24hrs. CT concerning for possible osteomyelitis versus septic joint; orthopedics will evaluate for surgical indication.  VSS, no concern for systemic infection. - Orthopedics consulted, greatly appreciate recommendations. - Continue IV antibiotics: CTX daily, Flagyl  q12, vancomycin daily - pain regimen: Tylenol  q6h PRN, oxycodone  5 mg q4h PRN - PT/OT - AM CBC, BMP - Fall precautions Alcohol use Reports 40 ounce beer every other day.  No history of alcohol withdrawal.  CIWA's were not recorded overnight -RN made aware and will follow today. - CIWAs  - Daily MVI, thiamine, folic acid Chronic health problem HIV-Biktarvy  daily; CD4, HIV RNA in process GERD-Protonix  40 mg daily HLD-fenofibrate  54 mg daily Depression - home sertraline  50 mg daily Current smoker-approximately 1 PPD; nicotine patch   FEN/GI: Heart healthy/carb modified diet PPx: Heparin in anticipation of surgical intervention Dispo:Home pending clinical improvement . Barriers include potential surgical intervention, continuation of IV antibiotics.   Subjective:  Patient seen this morning lying in bed with husband at bedside.  She reports she had some pain overnight, but was still able to sleep.  She reports wound is still draining.  Confirms that  she does take sertraline  50 mg daily at home, no other concerns at this time.  Objective: Temp:  [97.7 F (36.5 C)-98.5 F (36.9 C)] 98 F (36.7 C) (04/28 0752) Pulse Rate:  [65-72] 72 (04/28 0752) Resp:  [16-18] 18 (04/28 0752) BP: (87-146)/(43-94) 94/53 (04/28 0800) SpO2:  [99 %-100 %] 100 % (04/28 0752) Weight:  [54.9 kg] 54.9 kg (04/27 1124) Physical Exam: General: Well-appearing, propped up comfortably in bed, NAD. Respiratory: Normal work of breathing on room air. Extremities: Right foot with erythema, swelling around the base of the 4th and 5th toes.  Wound in between the toes with ongoing purulent discharge.  Some fluctuance on the dorsal surface at the base of the toes, but no crepitus.  Tender to palpation on the dorsal and plantar surface.  Skin in this area warm to the touch.  Laboratory: Most recent CBC Lab Results  Component Value Date   WBC 5.6 02/15/2024   HGB 11.4 (L) 02/15/2024   HCT 35.1 (L) 02/15/2024   MCV 94.1 02/15/2024   PLT 306 02/15/2024   Most recent BMP    Latest Ref Rng & Units 02/15/2024    6:01 AM  BMP  Glucose 70 - 99 mg/dL 147   BUN 6 - 20 mg/dL 10   Creatinine 8.29 - 1.00 mg/dL 5.62   Sodium 130 - 865 mmol/L 139   Potassium 3.5 - 5.1 mmol/L 3.9   Chloride 98 - 111 mmol/L 105   CO2 22 - 32 mmol/L 27   Calcium  8.9 - 10.3 mg/dL 9.5    7/84 CT right foot: IMPRESSION: 1. Soft tissue wound/ulceration along the dorsal fourth/fifth interspace, accounting for the radiographic abnormality. 2. Fluid collection associated with the adjacent fifth digit, raising concern  for osteomyelitis versus septic joint at the MTP joint. 3. No definite cortical destruction on CT.  Omar Bibber, DO 02/15/2024, 10:59 AM  PGY-1, [facility] Family [facility] FPTS Intern pager: [phone], text pages welcome Secure chat group [facility] Teaching Service

## 2024-02-15 NOTE — Progress Notes (Signed)
 Pharmacy Antibiotic Note  Michelle Waters is a 53 y.o. female admitted on 02/14/2024 with pain on right foot toe worsening over several weeks. Concerning for cellulitis or osteomyelitis. Upon presentation to the ED, patient received ceftriaxone 2g, Flagyl  500 mg, and vancomycin 1 g x1 dose. Patient remains afebrile. CRP elevated at 2.0. WBC normal at 5.6. Renal function normal with Scr at 0.97. Pharmacy has been consulted for vancomycin dosing.  Plan: Start Vancomycin 750 IV every 24 hours. Goal AUC 400-600. Calculated AUC 475.4 with Scr 0.97 and Vd 0.72.  Height: 4\' 10"  (147.3 cm) Weight: 54.9 kg (121 lb) IBW/kg (Calculated) : 40.9  Temp (24hrs), Avg:98.2 F (36.8 C), Min:97.9 F (36.6 C), Max:98.5 F (36.9 C)  Recent Labs  Lab 02/14/24 1303 02/15/24 0601  WBC 7.4 5.6  CREATININE 1.10* 0.97    Estimated Creatinine Clearance: 48.7 mL/min (by C-G formula based on SCr of 0.97 mg/dL).    Allergies  Allergen Reactions   Ampicillin Swelling and Rash   Penicillins Swelling and Rash   Shrimp [Shellfish Allergy] Swelling   Codeine Itching   Hydrocodone  Itching    Antimicrobials this admission: Ceftriaxone 2g daily 4/27 >>  Metronidazole  500 mg BID 4/27 >>  Vancomycin 1000 mg x1 dose 4/27 Vancomycin 750 mg daily 4/28 >>   Dose adjustments this admission:   Microbiology results: 4/27 BCx: NG x 24h  Thank you for allowing pharmacy to be a part of this patient's care.  Dean Every Zoejane Gaulin 02/15/2024 12:09 PM

## 2024-02-16 DIAGNOSIS — L03031 Cellulitis of right toe: Secondary | ICD-10-CM | POA: Diagnosis not present

## 2024-02-16 LAB — BASIC METABOLIC PANEL WITH GFR
Anion gap: 8 (ref 5–15)
BUN: 7 mg/dL (ref 6–20)
CO2: 26 mmol/L (ref 22–32)
Calcium: 9.6 mg/dL (ref 8.9–10.3)
Chloride: 105 mmol/L (ref 98–111)
Creatinine, Ser: 0.96 mg/dL (ref 0.44–1.00)
GFR, Estimated: >60 mL/min (ref >60)
Glucose, Bld: 100 mg/dL — ABNORMAL HIGH (ref 70–99)
Potassium: 4.2 mmol/L (ref 3.5–5.1)
Sodium: 139 mmol/L (ref 135–145)

## 2024-02-16 LAB — CBC
HCT: 36.3 % (ref 36.0–46.0)
Hemoglobin: 12 g/dL (ref 12.0–15.0)
MCH: 31 pg (ref 26.0–34.0)
MCHC: 33.1 g/dL (ref 30.0–36.0)
MCV: 93.8 fL (ref 80.0–100.0)
Platelets: 332 10*3/uL (ref 150–400)
RBC: 3.87 MIL/uL (ref 3.87–5.11)
RDW: 12.3 % (ref 11.5–15.5)
WBC: 5.9 10*3/uL (ref 4.0–10.5)
nRBC: 0 % (ref 0.0–0.2)

## 2024-02-16 LAB — HIV-1 RNA QUANT-NO REFLEX-BLD
HIV 1 RNA Quant: <20 {copies}/mL
LOG10 HIV-1 RNA: UNDETERMINED {Log_copies}/mL

## 2024-02-16 LAB — IRON AND TIBC
Iron: 119 ug/dL (ref 28–170)
Saturation Ratios: 40 % — ABNORMAL HIGH (ref 10.4–31.8)
TIBC: 297 ug/dL (ref 250–450)
UIBC: 178 ug/dL

## 2024-02-16 LAB — HEMOGLOBIN A1C
Hgb A1c MFr Bld: 5.5 % (ref 4.8–5.6)
Mean Plasma Glucose: 111.15 mg/dL

## 2024-02-16 LAB — VITAMIN B12: Vitamin B-12: 423 pg/mL (ref 180–914)

## 2024-02-16 LAB — FOLATE: Folate: 20.5 ng/mL (ref >5.9)

## 2024-02-16 NOTE — Assessment & Plan Note (Addendum)
 Hemoglobin stable.  Blood cultures NGTD 2 days. VSS, no concern for systemic infection. Hgb A1c 5.5. - Orthopedics consulted, greatly appreciate recommendations. - Continue IV antibiotics: CTX daily, Flagyl  q12, vancomycin daily - pain regimen: Tylenol  q6h PRN, oxycodone  5 mg q4h PRN - PT/OT - AM CBC, BMP - Fall precautions

## 2024-02-16 NOTE — Plan of Care (Signed)

## 2024-02-16 NOTE — Assessment & Plan Note (Signed)
 HIV-Biktarvy  daily; CD4, HIV RNA in process GERD-Protonix  40 mg daily HLD-fenofibrate  54 mg daily Depression - home sertraline  50 mg daily Current smoker-approximately 1 PPD; nicotine patch

## 2024-02-16 NOTE — Progress Notes (Signed)
 Mobility Specialist Progress Note:   02/16/24 1143  Mobility  Activity Ambulated with assistance in hallway  Level of Assistance Standby assist, set-up cues, supervision of patient - no hands on  Assistive Device None  Distance Ambulated (ft) 450 ft  Activity Response Tolerated well  Mobility Referral Yes  Mobility visit 1 Mobility  Mobility Specialist Start Time (ACUTE ONLY) 1035  Mobility Specialist Stop Time (ACUTE ONLY) 1045  Mobility Specialist Time Calculation (min) (ACUTE ONLY) 10 min   Pt received in bed, agreeable to mobility. C/o burning sensation with R toe, otherwise asx throughout. Pt was able to ambulate in hallway with supervision. No unsteadiness present. Pt returned to bed with call bell in reach and all needs met.   Sofia Dunn  Mobility Specialist Please contact via [employer] office at [phone]

## 2024-02-16 NOTE — Progress Notes (Signed)
     Daily Progress Note Intern Pager: [phone]  Patient name: Michelle Waters Medical record number: [id number] Date of birth: 1971-02-12 Age: 54 y.o. Gender: female  Primary Care Provider: Lawrance Presume, MD Consultants: Orthopedic surgery Code Status: Full  Pt Overview and Major Events to Date:  4/27-admitted  Assessment and Plan: Michelle Waters is a 53 year old female with history of well-controlled HIV on Biktarvy , presented for infection of the right small toe.  Imaging concerning for osteomyelitis versus septic joint.  Continuing on IV antibiotics. Assessment & Plan Cellulitis Hemoglobin stable.  Blood cultures NGTD 2 days. VSS, no concern for systemic infection. Hgb A1c 5.5. - Orthopedics consulted, greatly appreciate recommendations. - Continue IV antibiotics: CTX daily, Flagyl  q12, vancomycin daily - pain regimen: Tylenol  q6h PRN, oxycodone  5 mg q4h PRN - PT/OT - AM CBC, BMP - Fall precautions Alcohol use Reports 40 ounce beer every other day.  No history of alcohol withdrawal.  CIWA was negative overnight. - CIWAs - discontinue after 72 hours - Daily MVI, thiamine, folic acid Chronic health problem HIV-Biktarvy  daily; CD4, HIV RNA in process GERD-Protonix  40 mg daily HLD-fenofibrate  54 mg daily Depression - home sertraline  50 mg daily Current smoker-approximately 1 PPD; nicotine patch   FEN/GI: Heart healthy/carb modified diet PPx: Heparin in anticipation of surgical intervention Dispo:Home pending clinical improvement . Barriers include potential surgical intervention, continuation of IV antibiotics until appropriate to stepdown to PO.   Subjective:  Patient seen this morning sitting up comfortably in the bed.  She is hopeful to see orthopedics soon pain to her right foot unchanged.  Regular bowel movements, no opioid-induced constipation.  No other concerns.  Objective: Temp:  [98 F (36.7 C)-99 F (37.2 C)] 98.1 F (36.7 C) (04/29 0732) Pulse Rate:   [56-74] 56 (04/29 0732) Resp:  [18] 18 (04/29 0732) BP: (87-139)/(43-96) 122/84 (04/29 0732) SpO2:  [96 %-100 %] 99 % (04/29 0732) Physical Exam: General: Well-appearing, very pleasant, sitting up in bed comfortably. Cardiovascular: RRR Respiratory: Normal work of breathing on room air Extremities: Right LE wrapped, clean and dry.  Laboratory: Most recent CBC Lab Results  Component Value Date   WBC 5.9 02/16/2024   HGB 12.0 02/16/2024   HCT 36.3 02/16/2024   MCV 93.8 02/16/2024   PLT 332 02/16/2024   Most recent BMP    Latest Ref Rng & Units 02/16/2024    4:43 AM  BMP  Glucose 70 - 99 mg/dL 295   BUN 6 - 20 mg/dL 7   Creatinine 6.21 - 3.08 mg/dL 6.57   Sodium 846 - 962 mmol/L 139   Potassium 3.5 - 5.1 mmol/L 4.2   Chloride 98 - 111 mmol/L 105   CO2 22 - 32 mmol/L 26   Calcium  8.9 - 10.3 mg/dL 9.6    X5M 5.5 Folate 84.1 B12 423  Omar Bibber, DO 02/16/2024, 7:43 AM  PGY-1, [facility] FPTS Intern pager: [phone], text pages welcome Secure chat group [facility] Teaching Service

## 2024-02-16 NOTE — Consult Note (Signed)
 Reason for Consult: Right foot interdigital corn with underlying abscess Referring Physician: Sherline Distel, MD  Michelle Waters is an 53 y.o. female.  HPI: Patient presented to the emergency department with pain in her foot.  She had developed swelling and pain in the lateral aspect of her right foot.  X-rays and subsequent CT scan demonstrated some fluid buildup within the interdigital space of the fourth toe with communicating wound.  She was seen by Dr. Deeann Fare and the wound was expressed.  There was some purulent material that was decompressed.  I was consulted due to the complexity of the forefoot infection and need for possible foot surgery.  Patient complains of pain in the foot.  She states that the swelling has decreased significantly after decompression of the wound and antibiotics.  Past Medical History:  Diagnosis Date   Acid reflux    Elevated serum creatinine 10/05/2017   HIV (human immunodeficiency virus infection) (HCC)    Hypertension     Past Surgical History:  Procedure Laterality Date   COLPOSCOPY  09/20/2020       I & D EXTREMITY Right 08/30/2017   Procedure: IRRIGATION AND DEBRIDEMENT INDEX FINGER;  Surgeon: Winston Hawking, MD;  Location: [facility] OR;  Service: Orthopedics;  Laterality: Right;    Family History  Problem Relation Age of Onset   Hypertension Mother    Diabetes Mother    Lung cancer Maternal Uncle    Lung cancer Maternal Grandmother    Rectal cancer Neg Hx    Stomach cancer Neg Hx     Social History:  reports that she has been smoking cigarettes. She has never used smokeless tobacco. She reports current alcohol use of about 15.0 standard drinks of alcohol per week. She reports that she does not use drugs.  Allergies:  Allergies  Allergen Reactions   Ampicillin Swelling and Rash   Penicillins Swelling and Rash   Shrimp [Shellfish Allergy] Swelling   Codeine Itching   Hydrocodone  Itching    Medications: I have reviewed the patient's current  medications.  Results for orders placed or performed during the hospital encounter of 02/14/24 (from the past 48 hours)  CBC with Differential     Status: Abnormal   Collection Time: 02/14/24  1:03 PM  Result Value Ref Range   WBC 7.4 4.0 - 10.5 K/uL   RBC 3.82 (L) 3.87 - 5.11 MIL/uL   Hemoglobin 11.9 (L) 12.0 - 15.0 g/dL   HCT 16.1 09.6 - 04.5 %   MCV 95.3 80.0 - 100.0 fL   MCH 31.2 26.0 - 34.0 pg   MCHC 32.7 30.0 - 36.0 g/dL   RDW 40.9 81.1 - 91.4 %   Platelets 323 150 - 400 K/uL   nRBC 0.0 0.0 - 0.2 %   Neutrophils Relative % 56 %   Neutro Abs 4.1 1.7 - 7.7 K/uL   Lymphocytes Relative 34 %   Lymphs Abs 2.5 0.7 - 4.0 K/uL   Monocytes Relative 8 %   Monocytes Absolute 0.6 0.1 - 1.0 K/uL   Eosinophils Relative 1 %   Eosinophils Absolute 0.1 0.0 - 0.5 K/uL   Basophils Relative 1 %   Basophils Absolute 0.0 0.0 - 0.1 K/uL   Immature Granulocytes 0 %   Abs Immature Granulocytes 0.02 0.00 - 0.07 K/uL    Comment: Performed at [facility], [address] [place], [place] [address]  Basic metabolic panel     Status: Abnormal   Collection Time: 02/14/24  1:03 PM  Result Value Ref Range   Sodium 137 135 - 145 mmol/L   Potassium 4.0 3.5 - 5.1 mmol/L   Chloride 105 98 - 111 mmol/L   CO2 24 22 - 32 mmol/L   Glucose, Bld 103 (H) 70 - 99 mg/dL    Comment: Glucose reference range applies only to samples taken after fasting for at least 8 hours.   BUN 12 6 - 20 mg/dL   Creatinine, Ser 0.98 (H) 0.44 - 1.00 mg/dL   Calcium  9.1 8.9 - 10.3 mg/dL   GFR, Estimated 60 (L) >60 mL/min    Comment: (NOTE) Calculated using the CKD-EPI Creatinine Equation (2021)    Anion gap 8 5 - 15    Comment: Performed at [facility], [address] [place], [place] [address]  Sedimentation rate     Status: None   Collection Time: 02/14/24  1:03 PM  Result Value Ref Range   Sed Rate 10 0 - 22 mm/hr    Comment: Performed at [facility], [address], [place], [place] [address]   C-reactive protein     Status: Abnormal   Collection Time: 02/14/24  1:03 PM  Result Value Ref Range   CRP 2.0 (H) <1.0 mg/dL    Comment: Performed at [facility], [address], [place], [place] [address]  Blood culture (routine x 2)     Status: None (Preliminary result)   Collection Time: 02/14/24  2:45 PM   Specimen: BLOOD  Result Value Ref Range   Specimen Description BLOOD LEFT ANTECUBITAL    Special Requests      BOTTLES DRAWN AEROBIC AND ANAEROBIC Blood Culture adequate volume   Culture      NO GROWTH 2 DAYS Performed at [facility], [address] [place], [place] [address]    Report Status PENDING   Blood culture (routine x 2)     Status: None (Preliminary result)   Collection Time: 02/14/24  2:45 PM   Specimen: BLOOD LEFT ARM  Result Value Ref Range   Specimen Description BLOOD LEFT ARM    Special Requests      BOTTLES DRAWN AEROBIC AND ANAEROBIC Blood Culture results may not be optimal due to an inadequate volume of blood received in culture bottles   Culture      NO GROWTH 2 DAYS Performed at [facility], [address], [place], [place] [address]    Report Status PENDING   T-helper cells (CD4) count (not at [facility])     Status: None   Collection Time: 02/14/24  4:13 PM  Result Value Ref Range   CD4 T Cell Abs 1,344 400 - 1,790 /uL   CD4 % Helper T Cell 56 33 - 65 %    Comment: Performed at [facility], [address], [place], [place] [address]  CBC     Status: Abnormal   Collection Time: 02/15/24  6:01 AM  Result Value Ref Range   WBC 5.6 4.0 - 10.5 K/uL   RBC 3.73 (L) 3.87 - 5.11 MIL/uL   Hemoglobin 11.4 (L) 12.0 - 15.0 g/dL   HCT 13.2 (L) 44.0 - 10.2 %   MCV 94.1 80.0 - 100.0 fL   MCH 30.6 26.0 - 34.0 pg   MCHC 32.5 30.0 - 36.0 g/dL   RDW 72.5 36.6 - 44.0 %   Platelets 306 150 - 400 K/uL   nRBC 0.0 0.0 - 0.2 %  Comment: Performed at [facility], [address] [place], [place] [address]  Basic  metabolic panel     Status: Abnormal   Collection Time: 02/15/24  6:01 AM  Result Value Ref Range   Sodium 139 135 - 145 mmol/L   Potassium 3.9 3.5 - 5.1 mmol/L   Chloride 105 98 - 111 mmol/L   CO2 27 22 - 32 mmol/L   Glucose, Bld 106 (H) 70 - 99 mg/dL    Comment: Glucose reference range applies only to samples taken after fasting for at least 8 hours.   BUN 10 6 - 20 mg/dL   Creatinine, Ser 6.04 0.44 - 1.00 mg/dL   Calcium  9.5 8.9 - 10.3 mg/dL   GFR, Estimated >54 >09 mL/min    Comment: (NOTE) Calculated using the CKD-EPI Creatinine Equation (2021)    Anion gap 7 5 - 15    Comment: Performed at [facility], [address], [place], [place] [address]  CBC     Status: None   Collection Time: 02/16/24  4:43 AM  Result Value Ref Range   WBC 5.9 4.0 - 10.5 K/uL   RBC 3.87 3.87 - 5.11 MIL/uL   Hemoglobin 12.0 12.0 - 15.0 g/dL   HCT 47.8 29.5 - 62.1 %   MCV 93.8 80.0 - 100.0 fL   MCH 31.0 26.0 - 34.0 pg   MCHC 33.1 30.0 - 36.0 g/dL   RDW 30.8 65.7 - 84.6 %   Platelets 332 150 - 400 K/uL   nRBC 0.0 0.0 - 0.2 %    Comment: Performed at [facility], [address] [place], [place] [address]  Basic metabolic panel     Status: Abnormal   Collection Time: 02/16/24  4:43 AM  Result Value Ref Range   Sodium 139 135 - 145 mmol/L   Potassium 4.2 3.5 - 5.1 mmol/L   Chloride 105 98 - 111 mmol/L   CO2 26 22 - 32 mmol/L   Glucose, Bld 100 (H) 70 - 99 mg/dL    Comment: Glucose reference range applies only to samples taken after fasting for at least 8 hours.   BUN 7 6 - 20 mg/dL   Creatinine, Ser 2.84 0.44 - 1.00 mg/dL   Calcium  9.6 8.9 - 10.3 mg/dL   GFR, Estimated >13 >24 mL/min    Comment: (NOTE) Calculated using the CKD-EPI Creatinine Equation (2021)    Anion gap 8 5 - 15    Comment: Performed at [facility], [address] [place], [place] [address]  Hemoglobin A1c     Status: None   Collection Time: 02/16/24  4:43 AM  Result Value Ref Range   Hgb A1c MFr  Bld 5.5 4.8 - 5.6 %    Comment: (NOTE) Pre diabetes:          5.7%-6.4%  Diabetes:              >6.4%  Glycemic control for   <7.0% adults with diabetes    Mean Plasma Glucose 111.15 mg/dL    Comment: Performed at [facility], [address] [place], [place] [address]  Folate     Status: None   Collection Time: 02/16/24  4:43 AM  Result Value Ref Range   Folate 20.5 >5.9 ng/mL    Comment: Performed at [facility], [address], [place], [place] [address]  Vitamin B12     Status: None   Collection Time:  02/16/24  4:43 AM  Result Value Ref Range   Vitamin B-12 423 180 - 914 pg/mL    Comment: (NOTE) This assay is not validated for testing neonatal or myeloproliferative syndrome specimens for Vitamin B12 levels. Performed at [facility], [address], [place], [place] [address]   Iron and TIBC     Status: Abnormal   Collection Time: 02/16/24  4:43 AM  Result Value Ref Range   Iron 119 28 - 170 ug/dL   TIBC 621 308 - 657 ug/dL   Saturation Ratios 40 (H) 10.4 - 31.8 %   UIBC 178 ug/dL    Comment: Performed at [facility], [address], [place], [place] [address]    CT FOOT RIGHT W CONTRAST Result Date: 02/14/2024 EXAM: CT right lower extremity, with IV contrast 02/14/2024 06:25:47 PM TECHNIQUE: Axial images were acquired through the right lower extremity with IV contrast. Reformatted images were reviewed. Automated exposure control, iterative reconstruction, and/or weight based adjustment of the mA/kV was utilized to reduce the radiation dose to as low as reasonably achievable. COMPARISON: Right foot radiographs earlier today. CLINICAL HISTORY: Soft tissue infection suspected, foot, xray done. CT FOOT RIGHT W CONTRAST; Soft tissue infection suspected, foot, xray done; 75cc [id number] via IV. FINDINGS: BONES: No acute fracture. No definite cortical destruction on CT to suggest osteomyelitis. (Please note additional findings below.) JOINTS: No dislocation.  The joint spaces are normal. SOFT TISSUES: Soft tissue wound/ulceration along the dorsal aspect of the fourth/fifth interspace, accounting for the radiographic appearance. Tiny foci of subcutaneous gas appear to be related to an open wound. Fluid collection associated with the adjacent fifth digit, centered at the MTP joint, raising concern for osteomyeltisis/septic joint of the fifth digit. IMPRESSION: 1. Soft tissue wound/ulceration along the dorsal fourth/fifth interspace, accounting for the radiographic abnormality. 2. Fluid collection associated with the adjacent fifth digit, raising concern for osteomyelitis versus septic joint at the MTP joint. 3. No definite cortical destruction on CT. Electronically signed by: Zadie Herter MD 02/14/2024 08:26 PM EDT RP Workstation: [id number]   DG Foot Complete Right Result Date: 02/14/2024 CLINICAL DATA:  Pain and discharge from pinky toe beginning 1 week ago EXAM: RIGHT FOOT COMPLETE - 3+ VIEW COMPARISON:  02/09/2020 FINDINGS: No acute fracture or dislocation. No osseous destruction. Soft tissue swelling about the lateral forefoot including the fourth and fifth digits. Small foci of soft tissue gas identified volarly between the fourth and fifth proximal phalanges. IMPRESSION: Soft tissue swelling centered about the fifth and less so fourth digits with foci of soft tissue gas, suspicious for gas producing infection. No plain film evidence of osteomyelitis. Electronically Signed   By: Lore Rode M.D.   On: 02/14/2024 12:14    Review of Systems  Constitutional: Negative.   HENT: Negative.    Respiratory: Negative.    Cardiovascular: Negative.   Gastrointestinal: Negative.   Musculoskeletal:        Foot pain right  Neurological: Negative.   Psychiatric/Behavioral: Negative.    All other systems reviewed and are negative.  Blood pressure 122/84, pulse (!) 56, temperature 98.1 F (36.7 C), resp. rate 18, height 4\' 10"  (1.473 m), weight 54.9 kg, SpO2  99%. Physical Exam HENT:     Head: Atraumatic.     Mouth/Throat:     Mouth: Mucous membranes are moist.  Eyes:     Extraocular Movements: Extraocular movements intact.  Cardiovascular:     Rate and Rhythm: Bradycardia present.  Pulses: Normal pulses.  Pulmonary:     Effort: Pulmonary effort is normal.  Abdominal:     Palpations: Abdomen is soft.  Musculoskeletal:     Cervical back: Neck supple.     Comments: Right foot with dressing removed demonstrates soft corn in the fourth webspace.  There is some drainage of some serosanguineous type fluid.  Some swelling about the toe and foot in this area.  No foul odor.  Alignment acceptable.  No lacerations.  No other evidence of swelling.  Skin:    Findings: Lesion present.  Neurological:     Mental Status: She is alert.  Psychiatric:        Mood and Affect: Mood normal.     Assessment/Plan: Patient has a interdigital corn between the 4th and 5th toe within the webspace and some drainage of material there.  I am not able to express a significant amount of fluid there is some continued induration.  The toe and wound is well aligned.  At this point the abscess has been decompressed and is continuing to drain.  Will hold off on any type of surgery for now and see how she responds to antibiotics.  Would recommend continued antibiotic treatment.  If she fails antibiotic therapy she may benefit from formal surgery but will hold off for now.  Will continue to follow.  Donnamarie Gables 02/16/2024, 9:56 AM

## 2024-02-16 NOTE — Assessment & Plan Note (Addendum)
 Reports 40 ounce beer every other day.  No history of alcohol withdrawal.  CIWA was negative overnight. - CIWAs - discontinue after 72 hours - Daily MVI, thiamine, folic acid

## 2024-02-17 ENCOUNTER — Inpatient Hospital Stay (HOSPITAL_COMMUNITY)

## 2024-02-17 DIAGNOSIS — L03031 Cellulitis of right toe: Secondary | ICD-10-CM | POA: Diagnosis not present

## 2024-02-17 MED ORDER — GADOBUTROL 1 MMOL/ML IV SOLN
6.0000 mL | Freq: Once | INTRAVENOUS | Status: AC | PRN
  Administered 2024-02-17: 6 mL via INTRAVENOUS

## 2024-02-17 NOTE — Assessment & Plan Note (Addendum)
 Blood cultures NGTD 3 days. VSS.  - Orthopedics consulted, greatly appreciate recommendations. - Will follow-up again with orthopedics today for consideration of length of IV antibiotic regimen versus transitioning to p.o.; may also reach out to ID as indicated. - Continue IV antibiotics: CTX daily, Flagyl  q12, vancomycin daily - pain regimen: Tylenol  q6h PRN, oxycodone  5 mg q4h PRN - PT/OT - Fall precautions

## 2024-02-17 NOTE — Progress Notes (Signed)
     Daily Progress Note Intern Pager: [phone]  Patient name: Michelle Waters Medical record number: [id number] Date of birth: 11/15/1970 Age: 53 y.o. Gender: female  Primary Care Provider: Lawrance Presume, MD Consultants: Orthopedic surgery Code Status: Full  Pt Overview and Major Events to Date:  4/27-admitted  Assessment and Plan: Michelle Waters is a 53 year old female with history of well-controlled HIV on Biktarvy , presented for infection of the right small toe.  Imaging concerning for osteomyelitis versus septic joint.  Continuing on IV antibiotics, hopeful to stepdown to p.o. antibiotics. Assessment & Plan Cellulitis Blood cultures NGTD 3 days. VSS.  - Orthopedics consulted, greatly appreciate recommendations. - Will follow-up again with orthopedics today for consideration of length of IV antibiotic regimen versus transitioning to p.o.; may also reach out to ID as indicated. - Continue IV antibiotics: CTX daily, Flagyl  q12, vancomycin daily - pain regimen: Tylenol  q6h PRN, oxycodone  5 mg q4h PRN - PT/OT - Fall precautions Alcohol use Reports 40 ounce beer every other day.  No history of alcohol withdrawal.  CIWA was negative overnight.  Will discontinue monitoring after negative for 72 hours. - Daily MVI, thiamine, folic acid Chronic health problem HIV-Biktarvy  daily; CD4 WNL, HIV RNA not detected. GERD-Protonix  40 mg daily HLD-fenofibrate  54 mg daily Depression - home sertraline  50 mg daily Current smoker-approximately 1 PPD; nicotine patch   FEN/GI: Heart healthy/carb modified diet PPx: Heparin Dispo:Home pending clinical improvement . Barriers include appropriate transition to PO abx.   Subjective:  Patient seen this morning sitting up in bed.  She reports pain is well-controlled.  Wound is still draining.  If she is not having surgery she would like to be discharged soon as possible.  Objective: Temp:  [98 F (36.7 C)-98.6 F (37 C)] 98 F (36.7 C) (04/30  0750) Pulse Rate:  [56-60] 58 (04/30 0750) Resp:  [17-18] 17 (04/30 0506) BP: (115-147)/(67-83) 115/67 (04/30 0750) SpO2:  [97 %-100 %] 100 % (04/30 0750) Physical Exam: General: Well-appearing, pleasant, NAD Cardiovascular: RRR Respiratory: Normal work of breathing on room air Extremities: RLE wrapped, clean and dry.  Upon removing dressings erythema and swelling to foot is essentially unchanged.  Wound continues to drain purulent material.  Laboratory: Most recent CBC Lab Results  Component Value Date   WBC 5.9 02/16/2024   HGB 12.0 02/16/2024   HCT 36.3 02/16/2024   MCV 93.8 02/16/2024   PLT 332 02/16/2024   Most recent BMP    Latest Ref Rng & Units 02/16/2024    4:43 AM  BMP  Glucose 70 - 99 mg/dL 130   BUN 6 - 20 mg/dL 7   Creatinine 8.65 - 7.84 mg/dL 6.96   Sodium 295 - 284 mmol/L 139   Potassium 3.5 - 5.1 mmol/L 4.2   Chloride 98 - 111 mmol/L 105   CO2 22 - 32 mmol/L 26   Calcium  8.9 - 10.3 mg/dL 9.6     Omar Bibber, DO 02/17/2024, 12:30 PM  PGY-1, [facility] FPTS Intern pager: [phone], text pages welcome Secure chat group [facility] [address] [facility] Teaching Service

## 2024-02-17 NOTE — Assessment & Plan Note (Signed)
 HIV-Biktarvy  daily; CD4 WNL, HIV RNA not detected. GERD-Protonix  40 mg daily HLD-fenofibrate  54 mg daily Depression - home sertraline  50 mg daily Current smoker-approximately 1 PPD; nicotine patch

## 2024-02-17 NOTE — Assessment & Plan Note (Addendum)
 Reports 40 ounce beer every other day.  No history of alcohol withdrawal.  CIWA was negative overnight.  Will discontinue monitoring after negative for 72 hours. - Daily MVI, thiamine, folic acid

## 2024-02-17 NOTE — Progress Notes (Signed)
 Mobility Specialist: Progress Note   02/17/24 1500  Mobility  Activity Ambulated with assistance in hallway  Level of Assistance Standby assist, set-up cues, supervision of patient - no hands on  Assistive Device None  Distance Ambulated (ft) 350 ft  Activity Response Tolerated well  Mobility Referral Yes  Mobility visit 1 Mobility  Mobility Specialist Start Time (ACUTE ONLY) 1030  Mobility Specialist Stop Time (ACUTE ONLY) 1035  Mobility Specialist Time Calculation (min) (ACUTE ONLY) 5 min    Received pt in bed having no complaints and agreeable to mobility. Pt was asymptomatic throughout ambulation and returned to room w/o fault. Left in bed w/ call bell in reach and all needs met.  Deloria Fetch Mobility Specialist Please contact via SecureChat or Rehab office at [phone]

## 2024-02-18 ENCOUNTER — Other Ambulatory Visit (HOSPITAL_COMMUNITY): Payer: Self-pay

## 2024-02-18 DIAGNOSIS — M86171 Other acute osteomyelitis, right ankle and foot: Secondary | ICD-10-CM

## 2024-02-18 DIAGNOSIS — B20 Human immunodeficiency virus [HIV] disease: Secondary | ICD-10-CM

## 2024-02-18 DIAGNOSIS — M869 Osteomyelitis, unspecified: Secondary | ICD-10-CM

## 2024-02-18 MED ORDER — DOXYCYCLINE HYCLATE 100 MG PO TABS
100.0000 mg | ORAL_TABLET | Freq: Two times a day (BID) | ORAL | 0 refills | Status: AC
  Filled 2024-02-18: qty 56, 28d supply, fill #0

## 2024-02-18 MED ORDER — FOLIC ACID 1 MG PO TABS: 1.0000 mg | ORAL_TABLET | Freq: Every day | ORAL | Status: AC

## 2024-02-18 MED ORDER — DOXYCYCLINE HYCLATE 100 MG PO TABS: 100.0000 mg | ORAL_TABLET | Freq: Two times a day (BID) | ORAL | Status: DC

## 2024-02-18 MED ORDER — CEFADROXIL 500 MG PO CAPS
500.0000 mg | ORAL_CAPSULE | Freq: Two times a day (BID) | ORAL | 0 refills | Status: AC
  Filled 2024-02-18: qty 56, 28d supply, fill #0

## 2024-02-18 MED ORDER — ALBUTEROL SULFATE HFA 108 (90 BASE) MCG/ACT IN AERS: 2.0000 | INHALATION_SPRAY | Freq: Four times a day (QID) | RESPIRATORY_TRACT | 3 refills | Status: AC | PRN

## 2024-02-18 MED ORDER — CEFADROXIL 500 MG PO CAPS: 500.0000 mg | ORAL_CAPSULE | Freq: Two times a day (BID) | ORAL | Status: DC

## 2024-02-18 MED ORDER — OXYCODONE HCL 5 MG PO TABS
5.0000 mg | ORAL_TABLET | ORAL | 0 refills | Status: DC | PRN
  Filled 2024-02-18: qty 15, 3d supply, fill #0

## 2024-02-18 MED ORDER — ONDANSETRON 4 MG PO TBDP
4.0000 mg | ORAL_TABLET | Freq: Three times a day (TID) | ORAL | 0 refills | Status: DC | PRN
  Filled 2024-02-18: qty 20, 7d supply, fill #0

## 2024-02-18 NOTE — Progress Notes (Signed)
 Occupational Therapy Treatment Patient Details Name: Michelle Waters MRN: [id number] DOB: 07-06-1971 Today's Date: 02/18/2024   History of present illness Pt is a 53 y/o F presenting to ED on 02/14/24 wtih R foot pain, found to have possible ostemyelitis in R 4th/5th toes on CT. PMH Includes HIV, MDD, depression, tobacco use   OT comments  Pt received OOB upon entry with husband present, pleasant and agreeable to therapy. Pt ambulating and transferring with no assist when first arriving. Self care and grooming completed at sink with supervision throughout. Following, pt ambulating to ortho gym to practice tub transfers where she did well getting in/out safely. Pt was OOB ambulating and performing functional tasks 10+ minutes before returning to bed. Pt left with all needs met. Acute OT to continue to follow to address established goals to facilitate DC to next venue of care.        If plan is discharge home, recommend the following:  A little help with walking and/or transfers;A little help with bathing/dressing/bathroom;Assistance with cooking/housework;Direct supervision/assist for financial management;Direct supervision/assist for medications management;Assist for transportation;Help with stairs or ramp for entrance   Equipment Recommendations  Tub/shower seat    Recommendations for Other Services      Precautions / Restrictions Precautions Precautions: Fall Recall of Precautions/Restrictions: Intact Restrictions Weight Bearing Restrictions Per Provider Order: No       Mobility Bed Mobility Overal bed mobility: Independent             General bed mobility comments: OOB upon entering but able to get in with assist    Transfers Overall transfer level: Modified independent   Transfers: Sit to/from Stand Sit to Stand: Modified independent (Device/Increase time)           General transfer comment: no assist needed     Balance Overall balance assessment:  Independent Sitting-balance support: Feet supported       Standing balance support: No upper extremity supported Standing balance-Leahy Scale: Fair Standing balance comment: standing with no LOB, unable to bear weight on R foot and uses heel                           ADL either performed or assessed with clinical judgement   ADL Overall ADL's : Needs assistance/impaired     Grooming: Wash/dry hands;Wash/dry face;Oral care;Brushing hair;Independent Grooming Details (indicate cue type and reason): pt completing grooming tasks upon entering room, no assist needed to complete             Lower Body Dressing: Supervision/safety;Sitting/lateral leans Lower Body Dressing Details (indicate cue type and reason): don slip on shoes         Tub/ Shower Transfer: Therapist, sports Details (indicate cue type and reason): tub transfer practiced in gym, supervision for safety but pt was safe and steady while completing Functional mobility during ADLs: Supervision/safety General ADL Comments: supervision/indpendent for tasks, practiced tub transfer, and spent 10+ min OOB    Extremity/Trunk Assessment              Vision       Perception     Praxis     Communication Communication Communication: No apparent difficulties   Cognition Arousal: Alert Behavior During Therapy: WFL for tasks assessed/performed Cognition: No apparent impairments                               Following commands:  Intact        Cueing   Cueing Techniques: Verbal cues  Exercises      Shoulder Instructions       General Comments VSS    Pertinent Vitals/ Pain       Pain Assessment Pain Assessment: No/denies pain Pain Intervention(s): Monitored during session  Home Living                                          Prior Functioning/Environment              Frequency  Min 2X/week        Progress Toward Goals  OT  Goals(current goals can now be found in the care plan section)  Progress towards OT goals: Progressing toward goals  Acute Rehab OT Goals Patient Stated Goal: go home OT Goal Formulation: With patient Time For Goal Achievement: 02/29/24 Potential to Achieve Goals: Good ADL Goals Pt Will Perform Grooming: Independently;standing Pt Will Perform Tub/Shower Transfer: Tub transfer;Shower transfer;Independently;ambulating;shower seat Additional ADL Goal #1: pt will tolerate OOB activity x10 min in order to improve balance and activity tolerance for ADLs  Plan      Co-evaluation                 AM-PAC OT "6 Clicks" Daily Activity     Outcome Measure   Help from another person eating meals?: None Help from another person taking care of personal grooming?: None Help from another person toileting, which includes using toliet, bedpan, or urinal?: None Help from another person bathing (including washing, rinsing, drying)?: None Help from another person to put on and taking off regular upper body clothing?: None Help from another person to put on and taking off regular lower body clothing?: None 6 Click Score: 24    End of Session    OT Visit Diagnosis: Unsteadiness on feet (R26.81);Other abnormalities of gait and mobility (R26.89);Muscle weakness (generalized) (M62.81)   Activity Tolerance Patient tolerated treatment well   Patient Left in bed;with call bell/phone within reach   Nurse Communication Mobility status        Time: 1610-9604 OT Time Calculation (min): 17 min  Charges: OT General Charges $OT Visit: 1 Visit OT Treatments $Self Care/Home Management : 8-22 mins  Warden Buffa, BS, OTA/S   Weyman Bogdon 02/18/2024, 1:20 PM

## 2024-02-18 NOTE — Progress Notes (Signed)
     Michelle Waters is a 53 y.o. female   Orthopaedic diagnosis: Right foot infected interdigital corn with surrounding cellulitis  Subjective: Patient states she is doing much better.  She has been up and walking some.  Pain has improved.  She would like to go home.    Objectyive: Vitals:   02/18/24 0409 02/18/24 0836  BP: 102/61 115/67  Pulse: 61 61  Resp: 16 18  Temp: 97.8 F (36.6 C) 98 F (36.7 C)  SpO2: 96%      Exam: Awake and alert Respirations even and unlabored No acute distress  Right foot with interdigital soft corn.  Small amount of drainage.  Improvement in swelling.  Tender to palpation.  Assessment: Right foot infected interdigital corn with surrounding cellulitis, improved   Plan: The wound continues to drain.  Swelling seems to have decreased some.  Pain symptoms seem to have improved.  She is requesting to go home.  I am okay with following her as an outpatient if that is suitable to the hospitalist team.  Would defer to medical team for outpatient antibiotic therapy.  I can see her back in a week or so and continue to monitor her for improvement if needed.   Vickki Grandchild, MD

## 2024-02-18 NOTE — Plan of Care (Signed)

## 2024-02-18 NOTE — Consult Note (Signed)
 [facility]    Date of Admission:  02/14/2024     Total days of antibiotics 5               Reason for Consult: Osteomyelitis    Referring Provider: Dr. Andree Kayser Primary Care Provider: Lawrance Presume, MD   ASSESSMENT:  Michelle Waters is a 53 year old African-American female with history with well-controlled HIV disease presenting with right foot pain and found to have osteomyelitis of the right fifth proximal phalanx.  Blood cultures without growth to date.  No surgical interventions recommended at this time with orthopedics continuing to follow.  Remains on broad-spectrum antibiotic coverage with vancomycin , ceftriaxone , and metronidazole .  Discussed recommended plan of care to transition antibiotics to cefadroxil  500 mg p.o. twice daily and doxycycline  100 mg p.o. twice daily.  Anticipate 4 to 6 weeks of treatment.  Continue wound care per orthopedic recommendations.  Aware of penicillin allergy but has tolerated ceftriaxone  and would ensure tolerance to cefadroxil  prior to discharge.  Has follow-up in ID clinic in a couple of weeks.  HIV remains well-controlled with undetectable viral load and will continue current dose of Biktarvy .  Continue universal/standard precautions.  Remaining medical and supportive care per family medicine.  PLAN:  Change antibiotics to cefadroxil  500 mg p.o. twice daily and doxycycline  100 mg p.o. twice daily with anticipated duration of treatment of 4 weeks. Await tolerance of cefadroxil  prior to discharge. Continue wound care per orthopedics and follow-up per orthopedic recommendations. Standard/universal precautions. Follow-up in ID clinic as scheduled. Continue current dose of Biktarvy  for ART. Remaining medical and supportive care per family medicine.   Principal Problem:   Osteomyelitis (HCC)  Cellulitis Active Problems:   Chronic health problem   Cellulitis   Alcohol use    bictegravir-emtricitabine -tenofovir  AF  1  tablet Oral Daily   cefadroxil   500 mg Oral BID   doxycycline   100 mg Oral Q12H   fenofibrate   54 mg Oral Daily   folic acid   1 mg Oral Daily   heparin   5,000 Units Subcutaneous Q8H   multivitamin with minerals  1 tablet Oral Daily   nicotine   21 mg Transdermal Daily   pantoprazole   40 mg Oral Daily   sertraline   50 mg Oral Daily   thiamine   100 mg Oral Daily   Or   thiamine   100 mg Intravenous Daily     HPI: Michelle Waters is a 53 y.o. female with previous medical history of HIV disease, hypertension, and cocaine use presenting with right foot pain.  Michelle Waters is well-known to the ID service and has well-controlled HIV disease now presenting to the hospital with 1 week history of right toe pain.  Started with a small piece of skin falling off of her toe approximately 10 days prior to presentation.  Afebrile on admission with no leukocytosis.  Right foot x-ray with soft tissue swelling centered around the fifth and last around the fourth digits with foci of soft tissue gas and suspicious for gas producing infection.  CT right foot with soft tissue wounds/ulcerations along the dorsal 4th/5th interspace and fluid collection with adjacent fifth digit raising concern for osteomyelitis or septic joint at the MTP joint.  MRI right foot with marrow signal abnormality with associated enhancement of the right proximal fifth phalanx most compatible with osteomyelitis.  Last seen by orthopedics on 02/16/2024 with some continued induration.  Believed to have the abscess decompressed with continued drainage.  No surgical  interventions for now and recommendation for antibiotic treatment.  If antibiotic treatment therapy fails may benefit from formal surgery.   Michelle Waters is currently on day 5 of antimicrobial therapy with broad-spectrum coverage with vancomycin , ceftriaxone , and metronidazole .  Blood cultures from 02/14/2024 with no growth to date.  Continues to take Biktarvy  as prescribed with no adverse side  effects.  Last lab work from 02/14/2024 with undetectable viral load and CD4 count 1344.   Review of Systems: Review of Systems  Constitutional:  Negative for chills, fever and weight loss.  Respiratory:  Negative for cough, shortness of breath and wheezing.   Cardiovascular:  Negative for chest pain and leg swelling.  Gastrointestinal:  Negative for abdominal pain, constipation, diarrhea, nausea and vomiting.  Musculoskeletal:        Positive for right foot pain  Skin:  Negative for rash.     Past Medical History:  Diagnosis Date   Acid reflux    Elevated serum creatinine 10/05/2017   HIV (human immunodeficiency virus infection) (HCC)    Hypertension     Social History   Tobacco Use   Smoking status: Every Day    Current packs/day: 0.50    Types: Cigarettes   Smokeless tobacco: Never  Vaping Use   Vaping status: Never Used  Substance Use Topics   Alcohol use: Yes    Alcohol/week: 15.0 standard drinks of alcohol    Types: 15 Cans of beer per week   Drug use: No    Family History  Problem Relation Age of Onset   Hypertension Mother    Diabetes Mother    Lung cancer Maternal Uncle    Lung cancer Maternal Grandmother    Rectal cancer Neg Hx    Stomach cancer Neg Hx     Allergies  Allergen Reactions   Ampicillin Swelling and Rash   Penicillins Swelling and Rash   Shrimp [Shellfish Allergy] Swelling   Codeine Itching   Hydrocodone  Itching    OBJECTIVE: Blood pressure 115/67, pulse 61, temperature 98 F (36.7 C), temperature source Oral, resp. rate 18, height 4\' 10"  (1.473 m), weight 54.9 kg, SpO2 96%.  Physical Exam Constitutional:      General: She is not in acute distress.    Appearance: She is well-developed.  Cardiovascular:     Rate and Rhythm: Normal rate and regular rhythm.     Heart sounds: Normal heart sounds.  Pulmonary:     Effort: Pulmonary effort is normal.     Breath sounds: Normal breath sounds.  Musculoskeletal:     Comments: Right  lateral foot swelling and tenderness.  No drainage able to be elicited.  Skin:    General: Skin is warm and dry.  Neurological:     Mental Status: She is alert and oriented to person, place, and time.  Psychiatric:        Mood and Affect: Mood normal.     Lab Results Lab Results  Component Value Date   WBC 5.9 02/16/2024   HGB 12.0 02/16/2024   HCT 36.3 02/16/2024   MCV 93.8 02/16/2024   PLT 332 02/16/2024    Lab Results  Component Value Date   CREATININE 0.96 02/16/2024   BUN 7 02/16/2024   NA 139 02/16/2024   K 4.2 02/16/2024   CL 105 02/16/2024   CO2 26 02/16/2024    Lab Results  Component Value Date   ALT 14 07/28/2023   AST 21 07/28/2023   ALKPHOS 124 03/26/2022   BILITOT  0.2 07/28/2023     Microbiology: Recent Results (from the past 240 hours)  Blood culture (routine x 2)     Status: None (Preliminary result)   Collection Time: 02/14/24  2:45 PM   Specimen: BLOOD  Result Value Ref Range Status   Specimen Description BLOOD LEFT ANTECUBITAL  Final   Special Requests   Final    BOTTLES DRAWN AEROBIC AND ANAEROBIC Blood Culture adequate volume   Culture   Final    NO GROWTH 4 DAYS Performed at [facility], [address] [place], [place] [address]    Report Status PENDING  Incomplete  Blood culture (routine x 2)     Status: None (Preliminary result)   Collection Time: 02/14/24  2:45 PM   Specimen: BLOOD LEFT ARM  Result Value Ref Range Status   Specimen Description BLOOD LEFT ARM  Final   Special Requests   Final    BOTTLES DRAWN AEROBIC AND ANAEROBIC Blood Culture results may not be optimal due to an inadequate volume of blood received in culture bottles   Culture   Final    NO GROWTH 4 DAYS Performed at [facility], [address], [place], [place] [address]    Report Status PENDING  Incomplete     Marlan Silva, NP [facility]  02/18/2024  1:23 PM

## 2024-02-18 NOTE — Assessment & Plan Note (Deleted)
 Blood cultures NGTD 4 days. VSS.  - Orthopedics consulted, greatly appreciate recommendations. - Will follow-up again with orthopedics today for consideration of length of IV antibiotic regimen versus transitioning to p.o.; may also reach out to ID as indicated. - Continue IV antibiotics: CTX daily, Flagyl  q12, vancomycin  daily - pain regimen: Tylenol  q6h PRN, oxycodone  5 mg q4h PRN - PT/OT - Fall precautions

## 2024-02-18 NOTE — Plan of Care (Signed)
  Problem: Education: Goal: Knowledge of General Education information will improve Description: Including pain rating scale, medication(s)/side effects and non-pharmacologic comfort measures Outcome: Progressing   Problem: Health Behavior/Discharge Planning: Goal: Ability to manage health-related needs will improve Outcome: Progressing   Problem: Clinical Measurements: Goal: Ability to maintain clinical measurements within normal limits will improve Outcome: Progressing Goal: Will remain free from infection Outcome: Progressing Goal: Diagnostic test results will improve Outcome: Progressing Goal: Respiratory complications will improve Outcome: Progressing   Problem: Activity: Goal: Risk for activity intolerance will decrease Outcome: Progressing   Problem: Elimination: Goal: Will not experience complications related to bowel motility Outcome: Progressing   Problem: Safety: Goal: Ability to remain free from injury will improve Outcome: Progressing   Problem: Skin Integrity: Goal: Risk for impaired skin integrity will decrease Outcome: Progressing

## 2024-02-18 NOTE — Progress Notes (Signed)
 Orthopedic Tech Progress Note Patient Details:  Michelle Waters 22-Jul-1971 [id number]  Ortho Devices Type of Ortho Device: Crutches, CAM walker Ortho Device/Splint Location: RLE Ortho Device/Splint Interventions: Application, Ordered   Post Interventions Patient Tolerated: Well  Arye Weyenberg A Caira Poche 02/18/2024, 10:48 AM

## 2024-02-18 NOTE — Discharge Summary (Addendum)
 Family Medicine Teaching [facility] Discharge Summary  Patient name: Michelle Waters Medical record number: [id number] Date of birth: 1971-08-05 Age: 53 y.o. Gender: female Date of Admission: 02/14/2024  Date of Discharge: 02/18/24 Admitting Physician: Omar Bibber, DO  Primary Care Provider: Lawrance Presume, MD Consultants: Orthopedic surgery, infectious disease  Indication for Hospitalization: Right foot wound, infection  Brief Hospital Course:  Michelle Waters is a 53 y.o.female with a history of HIV well-controlled on Biktarvy , GERD, HLD, MDD who was admitted to the [facility] Medicine Teaching Service at [facility] for right foot cellulitis.   Her hospital course is detailed below:  Cellulitis of right foot Patient started on broad-spectrum antibiotics for gas producing cellulitis of fifth digit on right foot.  CT imaging obtained showed soft tissue wound/ulceration along the dorsal 4th/5th interspace with fluid collection adjacent to the fifth digit raising concern for osteomyelitis versus septic joint at the MTP joint.  Blood cultures were collected prior to starting antibiotics and these remained negative.  Orthopedic surgery was consulted and did not recommend surgery. IV antibiotics continued as below.  Infectious Disease was consulted to weigh in on antibiotic regimen.  They recommended MRI which showed osteomyelitis, but fortunately no evidence of septic joint.  ID recommended a 4-week course of PO cefadroxil  and doxycycline , and the patient was discharged with this regimen.  Antibiotic course: IV Vancomycin  (4/27-5/1) IV Ceftriaxone  (4/27-5/1) IV Metronidazole  (4/27-5/1)  Alcohol use Patient endorses 40 ounces of beer every other day, no history of alcohol withdrawal.  Started daily MVI, thiamine  and folic acid .  CIWA's without Ativan  remained stable and patient did not have withdrawal.   Other chronic conditions were medically managed with home medications and formulary  alternatives as necessary (HIV, GERD, depression)   PCP Follow-up Recommendations: Ensure good antibiotic compliance without worsening of infection.   Discharge Diagnoses/Problem List:  Principal Problem:   Osteomyelitis (HCC)  Cellulitis Active Problems:   Chronic health problem   Cellulitis   Alcohol use  Disposition: Home  Discharge Condition: Stable  Discharge Exam:  General: well appearing, walking around room, NAD CV: RRR Resp: Normal WOB on room air Extremities: RLE wrapped, clean and dry  Significant Procedures: None  Significant Labs and Imaging:  No results for input(s): "WBC", "HGB", "HCT", "PLT" in the last 48 hours. No results for input(s): "NA", "K", "CL", "CO2", "GLUCOSE", "BUN", "CREATININE", "CALCIUM ", "MG", "PHOS", "ALKPHOS", "AST", "ALT", "ALBUMIN", "PROTEIN" in the last 48 hours.  4/30 right foot MRI: IMPRESSION: 1. Marrow signal abnormality with associated enhancement of the right fifth proximal phalanx, most compatible with osteomyelitis. 2. Soft tissue ulceration at the dorsal aspect of the fourth webspace, between the proximal fourth and fifth digits, with a thin 4 mm curvilinear sinus tract extending into the dorsal soft tissues with surrounding cellulitis.  4/27 Right foot xray: IMPRESSION: Soft tissue swelling centered about the fifth and less so fourth digits with foci of soft tissue gas, suspicious for gas producing infection. No plain film evidence of osteomyelitis.  4/27 CT right foot: IMPRESSION: 1. Soft tissue wound/ulceration along the dorsal fourth/fifth interspace, accounting for the radiographic abnormality. 2. Fluid collection associated with the adjacent fifth digit, raising concern for osteomyelitis versus septic joint at the MTP joint. 3. No definite cortical destruction on CT.  Results/Tests Pending at Time of Discharge: none  Discharge Medications:  Allergies as of 02/18/2024       Reactions   Ampicillin Swelling, Rash    Penicillins Swelling, Rash   Shrimp [shellfish  Allergy] Swelling   Codeine Itching   Hydrocodone  Itching        Medication List     TAKE these medications    acetaminophen  500 MG tablet Commonly known as: TYLENOL  Take 1,000 mg by mouth every 6 (six) hours as needed for moderate pain.   albuterol  108 (90 Base) MCG/ACT inhaler Commonly known as: VENTOLIN  HFA Inhale 2 puffs into the lungs every 6 (six) hours as needed for wheezing or shortness of breath.   Biktarvy  50-200-25 MG Tabs tablet Generic drug: bictegravir-emtricitabine -tenofovir  AF Take 1 tablet by mouth daily   cefadroxil  500 MG capsule Commonly known as: DURICEF Take 1 capsule (500 mg total) by mouth 2 (two) times daily for 28 days.   doxycycline  100 MG tablet Commonly known as: VIBRA -TABS Take 1 tablet (100 mg total) by mouth 2 (two) times daily for 28 days.   Ensure Take 237 mLs by mouth 2 (two) times daily between meals.   fenofibrate  48 MG tablet Commonly known as: Tricor  Take 1 tablet (48 mg total) by mouth daily.   folic acid  1 MG tablet Commonly known as: FOLVITE  Take 1 tablet (1 mg total) by mouth daily.   omeprazole  20 MG capsule Commonly known as: PRILOSEC Take 1 capsule (20 mg total) by mouth daily. Please schedule a yearly follow up for further refills. Thank you   ondansetron  4 MG disintegrating tablet Commonly known as: ZOFRAN -ODT Take 1 tablet (4 mg total) by mouth every 8 (eight) hours as needed for nausea or vomiting.   oxyCODONE  5 MG immediate release tablet Commonly known as: Oxy IR/ROXICODONE  Take 1 tablet (5 mg total) by mouth every 4 (four) hours as needed for moderate pain (pain score 4-6).   sertraline  50 MG tablet Commonly known as: ZOLOFT  Take 50 mg by mouth daily.        Discharge Instructions: Please refer to Patient Instructions section of EMR for full details.  Patient was counseled important signs and symptoms that should prompt return to medical care, changes in  medications, dietary instructions, activity restrictions, and follow up appointments.   Follow-Up Appointments:   Omar Bibber, DO 02/18/2024, 12:22 PM PGY-1, [facility]

## 2024-02-18 NOTE — Assessment & Plan Note (Deleted)
 HIV-Biktarvy  daily; CD4 WNL, HIV RNA not detected. GERD-Protonix  40 mg daily HLD-fenofibrate  54 mg daily Depression - home sertraline  50 mg daily Current smoker-approximately 1 PPD; nicotine patch

## 2024-02-19 LAB — CULTURE, BLOOD (ROUTINE X 2)
Culture: NO GROWTH
Culture: NO GROWTH
Special Requests: ADEQUATE

## 2024-02-22 ENCOUNTER — Telehealth: Payer: Self-pay

## 2024-02-22 NOTE — Transitions of Care (Post Inpatient/ED Visit) (Signed)
 02/22/2024  Name: Michelle Waters MRN: [id number] DOB: 12-08-1970  Today's TOC FU Call Status: Today's TOC FU Call Status:: Successful TOC FU Call Completed TOC FU Call Complete Date: 02/22/24 Patient's Name and Date of Birth confirmed.  Transition Care Management Follow-up Telephone Call Date of Discharge: 02/18/24 Discharge Facility: [facility]) Type of Discharge: Inpatient Admission Primary Inpatient Discharge Diagnosis:: osteomyelitis How have you been since you were released from the hospital?: Better Any questions or concerns?: No  Items Reviewed: Did you receive and understand the discharge instructions provided?: Yes Medications obtained,verified, and reconciled?: Yes (Medications Reviewed) (She said she can't take the doxycycline  and oxycodone . She stated they may as well take her toe off because she can't take that antibiotic.I instructed her to call [facility] and report her concerns about the antibiotic and oxycodone  and she said she will call) Any new allergies since your discharge?: No Dietary orders reviewed?: No Do you have support at home?: Yes People in Home [RPT]: spouse Name of Support/Comfort Primary Source: her husband  Medications Reviewed Today: Medications Reviewed Today     Reviewed by Burnett Carson, RN (Case Manager) on 02/22/24 at 1020  Med List Status: <None>   Medication Order Taking? Sig Documenting Provider Last Dose Status Informant  acetaminophen  (TYLENOL ) 500 MG tablet [id number] No Take 1,000 mg by mouth every 6 (six) hours as needed for moderate pain.  [provider] Unknown Active Self, Pharmacy Records  albuterol  (VENTOLIN  HFA) 108 [phone] Base) MCG/ACT inhaler [id number]  Inhale 2 puffs into the lungs every 6 (six) hours as needed for wheezing or shortness of breath. Clem Currier, DO  Active   bictegravir-emtricitabine -tenofovir  AF (BIKTARVY ) 50-200-25 MG TABS tablet [id number] No Take 1 tablet by mouth daily Lawrance Presume, MD 02/14/2024  Morning Active Self, Pharmacy Records  cefadroxil  (DURICEF) 500 MG capsule [id number]  Take 1 capsule (500 mg total) by mouth 2 (two) times daily for 28 days. Calone, Gregory D, FNP  Active   doxycycline  (VIBRA -TABS) 100 MG tablet [id number]  Take 1 tablet (100 mg total) by mouth 2 (two) times daily for 28 days. Calone, Gregory D, FNP  Active   Ensure [facility]) [id number] No Take 237 mLs by mouth 2 (two) times daily between meals. Calone, Gregory D, FNP Taking Active Self, Pharmacy Records  fenofibrate  (TRICOR ) 48 MG tablet [id number] No Take 1 tablet (48 mg total) by mouth daily. Calone, Gregory D, FNP 02/14/2024 Morning Active Self, Pharmacy Records  folic acid  (FOLVITE ) 1 MG tablet [id number]  Take 1 tablet (1 mg total) by mouth daily. Clem Currier, DO  Active   omeprazole  (PRILOSEC) 20 MG capsule [id number] No Take 1 capsule (20 mg total) by mouth daily. Please schedule a yearly follow up for further refills. Thank you Lawrance Presume, MD 02/14/2024 Morning Active Self, Pharmacy Records  ondansetron  (ZOFRAN -ODT) 4 MG disintegrating tablet [id number]  Take 1 tablet (4 mg total) by mouth every 8 (eight) hours as needed for nausea or vomiting. Clem Currier, DO  Active   oxyCODONE  (OXY IR/ROXICODONE ) 5 MG immediate release tablet [id number]  Take 1 tablet (5 mg total) by mouth every 4 (four) hours as needed for moderate pain (pain score 4-6). Clem Currier, DO  Active   sertraline  (ZOLOFT ) 50 MG tablet [id number] No Take 50 mg by mouth daily. [provider] Past Week Active             Home Care and Equipment/Supplies: Were Home Health Services Ordered?: No Any new equipment or  medical supplies ordered?: Yes Name of Medical supply agency?: she received crutches and a boot when she left the hospital Were you able to get the equipment/medical supplies?: Yes Do you have any questions related to the use of the equipment/supplies?: No  Functional Questionnaire: Do you need assistance with  bathing/showering or dressing?: No (She said she has a dressing on her foot and takes care of that herself.) Do you need assistance with meal preparation?: No (She said her husband can assist if needed.) Do you need assistance with eating?: No Do you have difficulty maintaining continence: No Do you need assistance with getting out of bed/getting out of a chair/moving?: Yes (ambulates with crutches and a boot on her right foot and) Do you have difficulty managing or taking your medications?: No  Follow up appointments reviewed: PCP Follow-up appointment confirmed?: No MD Provider Line [phone] Given: No (She said she will call back to schedule an appointment. She told me she was  doing laundry at her uncle's house and would call me when she has time to talk) Specialist Hospital Follow-up appointment confirmed?: Yes Date of Specialist follow-up appointment?: 03/01/24 Follow-Up Specialty Provider:: [facility].   02/26/2024- behavioral health. Do you need transportation to your follow-up appointment?: No Do you understand care options if your condition(s) worsen?: Yes-patient verbalized understanding    SIGNATURE. Burnett Carson, RN

## 2024-02-23 ENCOUNTER — Other Ambulatory Visit: Payer: Self-pay | Admitting: Internal Medicine

## 2024-02-23 ENCOUNTER — Ambulatory Visit (HOSPITAL_COMMUNITY): Payer: Self-pay | Admitting: Clinical

## 2024-02-24 ENCOUNTER — Telehealth: Payer: Self-pay

## 2024-02-24 NOTE — Telephone Encounter (Signed)
 From [facility] call:  She said she is feeling better overall.   She stated that she has all of her medications and then went on to say, she can't take the doxycycline  and oxycodone . She stated they may as well take her toe off because she can't take that antibiotic.I instructed her to call [facility] and report her concerns about the antibiotic and oxycodone  and she said she will call.  Her appointment with [facility] is 03/01/2024.   She said she takes care of the dressing on her foot by herself  She has the boot and crutches.   She said she will call back to schedule an appointment with Dr Lincoln Renshaw.  She told me she was doing laundry at her uncle's house and would call me when she has time to talk.

## 2024-02-26 ENCOUNTER — Telehealth (INDEPENDENT_AMBULATORY_CARE_PROVIDER_SITE_OTHER): Payer: Self-pay | Admitting: Student

## 2024-02-26 DIAGNOSIS — Z91199 Patient's noncompliance with other medical treatment and regimen due to unspecified reason: Secondary | ICD-10-CM

## 2024-02-26 NOTE — Progress Notes (Signed)
 Called patient, who was unable to make her appointment due to recovery from cellulitis of the foot. Her phone does not have virtual visit capabilities.  Patient would like to reschedule for an in person visit on May 29 at 3 PM.  Shery Done, MD PGY-3 02/26/2024  9:49 AM [facility]

## 2024-03-01 ENCOUNTER — Ambulatory Visit: Payer: Medicaid Other | Admitting: Family

## 2024-03-02 ENCOUNTER — Other Ambulatory Visit: Payer: Self-pay | Admitting: Family

## 2024-03-04 ENCOUNTER — Other Ambulatory Visit: Payer: Self-pay

## 2024-03-04 ENCOUNTER — Encounter: Payer: Self-pay | Admitting: Family

## 2024-03-04 ENCOUNTER — Ambulatory Visit: Admitting: Family

## 2024-03-04 VITALS — BP 135/89 | HR 75 | Temp 97.2°F | Ht <= 58 in | Wt 117.0 lb

## 2024-03-04 DIAGNOSIS — M86171 Other acute osteomyelitis, right ankle and foot: Secondary | ICD-10-CM

## 2024-03-04 DIAGNOSIS — B20 Human immunodeficiency virus [HIV] disease: Secondary | ICD-10-CM

## 2024-03-04 DIAGNOSIS — F17200 Nicotine dependence, unspecified, uncomplicated: Secondary | ICD-10-CM

## 2024-03-04 DIAGNOSIS — Z9189 Other specified personal risk factors, not elsewhere classified: Secondary | ICD-10-CM

## 2024-03-04 DIAGNOSIS — M86271 Subacute osteomyelitis, right ankle and foot: Secondary | ICD-10-CM

## 2024-03-04 DIAGNOSIS — Z0000 Encounter for general adult medical examination without abnormal findings: Secondary | ICD-10-CM

## 2024-03-04 DIAGNOSIS — F17210 Nicotine dependence, cigarettes, uncomplicated: Secondary | ICD-10-CM

## 2024-03-04 MED ORDER — BIKTARVY 50-200-25 MG PO TABS: ORAL_TABLET | ORAL | 5 refills | Status: DC

## 2024-03-04 NOTE — Progress Notes (Signed)
 Brief Narrative   Patient ID: Michelle Waters, female    DOB: Mar 13, 1971, 53 y.o.   MRN: [id number]  Michelle Waters is a 53 year old African-American female diagnosed with HIV in November 2018 with risk factor heterosexual contact. CD4 nadir of 1080 and viral load of 472.  Entered care at [facility] Stage 2. Genotype with K103R/V179D with resistance to Efavirinez and Nevirapine. No history of opportunistic infection. ART experienced with Biktarvy    Subjective:   Chief Complaint  Patient presents with   Follow-up    B20    HPI:  Michelle Waters is a 54 y.o. female with HIV disease last seen on 07/28/23 with well controlled virus and good adherence and tolerance to Biktarvy . Viral load was undetectable with CD4 count 1096. Kidney function, liver function and electrolytes within normal ranges. Lipid profile with triglycerides 735, HDL 57 and LDL not able to be calculated. ASCVD risk is 8.2%. In the interim was seen during hospitalization for right foot pain and found to have osteomyelitis of the right fifth proximal phalanx. No surgical intervention recommended at the time and was discharged on broad spectrum coverage with Cefadroxil  and doxycyline for 4 weeks. Lab work from the hospital with HIV RNA level undetectable and CD4 count 1344. Here today for hospitalization and HIV follow up.  Michelle Waters has been doing okay since leaving the hospital and has been taking her medications as prescribed and noted some upset stomach with the doxycycline  when she did not take it with food, otherwise tolerating medication well. No problems getting Biktarvy  from the pharmacy. Performing wound care on her right foot daily and has noted improvements. Covered by Medicaid. Housing is stable with some food insecurity as her SNAP benefits were cut to $26/month. Using Medicaid for transportation. Continues to smoke tobacco daily. Healthcare maintenance reviewed. Condoms and site specific STD testing offered.   Denies fevers,  chills, night sweats, headaches, changes in vision, neck pain/stiffness, nausea, diarrhea, vomiting, lesions or rashes.  Lab Results  Component Value Date   CD4TCELL 56 02/14/2024   CD4TABS 1,344 02/14/2024   Lab Results  Component Value Date   HIV1RNAQUANT <20 02/14/2024     Allergies  Allergen Reactions   Ampicillin Swelling and Rash   Penicillins Swelling and Rash   Shrimp [Shellfish Allergy] Swelling   Codeine Itching   Hydrocodone  Itching      Outpatient Medications Prior to Visit  Medication Sig Dispense Refill   acetaminophen  (TYLENOL ) 500 MG tablet Take 1,000 mg by mouth every 6 (six) hours as needed for moderate pain.      albuterol  (VENTOLIN  HFA) 108 (90 Base) MCG/ACT inhaler Inhale 2 puffs into the lungs every 6 (six) hours as needed for wheezing or shortness of breath. 54 g 3   cefadroxil  (DURICEF) 500 MG capsule Take 1 capsule (500 mg total) by mouth 2 (two) times daily for 28 days. 56 capsule 0   doxycycline  (VIBRA -TABS) 100 MG tablet Take 1 tablet (100 mg total) by mouth 2 (two) times daily for 28 days. 56 tablet 0   Ensure (ENSURE) Take 237 mLs by mouth 2 (two) times daily between meals. 237 mL 6   fenofibrate  (TRICOR ) 48 MG tablet TAKE 1 TABLET(48 MG) BY MOUTH DAILY 90 tablet 2   folic acid  (FOLVITE ) 1 MG tablet Take 1 tablet (1 mg total) by mouth daily.     omeprazole  (PRILOSEC) 20 MG capsule TAKE 1 CAPSULE(20 MG) BY MOUTH DAILY. FOLLOW UP FOR FURTHER REFILLS. THANK YOU 90  capsule 1   ondansetron  (ZOFRAN -ODT) 4 MG disintegrating tablet Take 1 tablet (4 mg total) by mouth every 8 (eight) hours as needed for nausea or vomiting. 20 tablet 0   oxyCODONE  (OXY IR/ROXICODONE ) 5 MG immediate release tablet Take 1 tablet (5 mg total) by mouth every 4 (four) hours as needed for moderate pain (pain score 4-6). 15 tablet 0   sertraline  (ZOLOFT ) 50 MG tablet Take 50 mg by mouth daily.     bictegravir-emtricitabine -tenofovir  AF (BIKTARVY ) 50-200-25 MG TABS tablet Take 1  tablet by mouth daily 30 tablet 2   No facility-administered medications prior to visit.     Past Medical History:  Diagnosis Date   Acid reflux    Elevated serum creatinine 10/05/2017   HIV (human immunodeficiency virus infection) (HCC)    Hypertension      Past Surgical History:  Procedure Laterality Date   COLPOSCOPY  09/20/2020       I & D EXTREMITY Right 08/30/2017   Procedure: IRRIGATION AND DEBRIDEMENT INDEX FINGER;  Surgeon: Winston Hawking, MD;  Location: [facility] OR;  Service: Orthopedics;  Laterality: Right;        Review of Systems  Constitutional:  Negative for appetite change, chills, diaphoresis, fatigue, fever and unexpected weight change.  Eyes:        Negative for acute change in vision  Respiratory:  Negative for chest tightness, shortness of breath and wheezing.   Cardiovascular:  Negative for chest pain.  Gastrointestinal:  Negative for diarrhea, nausea and vomiting.  Genitourinary:  Negative for dysuria, pelvic pain and vaginal discharge.  Musculoskeletal:  Negative for neck pain and neck stiffness.  Skin:  Negative for rash.  Neurological:  Negative for seizures, syncope, weakness and headaches.  Hematological:  Negative for adenopathy. Does not bruise/bleed easily.  Psychiatric/Behavioral:  Negative for hallucinations.      Objective:   BP 135/89   Pulse 75   Temp (!) 97.2 F (36.2 C) (Temporal)   Ht 4\' 10"  (1.473 m)   Wt 117 lb (53.1 kg)   SpO2 95%   BMI 24.45 kg/m  Nursing note and vital signs reviewed.  Physical Exam Constitutional:      General: She is not in acute distress.    Appearance: She is well-developed.  Eyes:     Conjunctiva/sclera: Conjunctivae normal.  Cardiovascular:     Rate and Rhythm: Normal rate and regular rhythm.     Heart sounds: Normal heart sounds. No murmur heard.    No friction rub. No gallop.  Pulmonary:     Effort: Pulmonary effort is normal. No respiratory distress.     Breath sounds: Normal breath  sounds. No wheezing or rales.  Chest:     Chest wall: No tenderness.  Abdominal:     General: Bowel sounds are normal.     Palpations: Abdomen is soft.     Tenderness: There is no abdominal tenderness.  Musculoskeletal:     Cervical back: Neck supple.  Lymphadenopathy:     Cervical: No cervical adenopathy.  Skin:    General: Skin is warm and dry.     Findings: No rash.  Neurological:     Mental Status: She is alert and oriented to person, place, and time.  Psychiatric:        Behavior: Behavior normal.        Thought Content: Thought content normal.        Judgment: Judgment normal.          03/04/2024  10:52 AM 01/15/2024   11:31 AM 12/17/2023    9:44 AM 05/19/2023    1:41 PM 02/03/2023   10:28 AM  Depression screen PHQ 2/9  Decreased Interest 1  2 1  0  Down, Depressed, Hopeless 0  2 1 0  PHQ - 2 Score 1  4 2  0  Altered sleeping 0  2 1   Tired, decreased energy 0  2 1   Change in appetite 0  2 0   Feeling bad or failure about yourself  0  2 1   Trouble concentrating    2   Moving slowly or fidgety/restless 0  2 0   Suicidal thoughts 0  2 0   PHQ-9 Score 1  16 7    Difficult doing work/chores Not difficult at all  Somewhat difficult       Information is confidential and restricted. Go to Review Flowsheets to unlock data.        03/04/2024   10:52 AM 12/17/2023    9:45 AM 05/19/2023    1:41 PM 12/29/2022    1:41 PM  GAD 7 : Generalized Anxiety Score  Nervous, Anxious, on Edge 0 3 1 3   Control/stop worrying 0 3 1 3   Worry too much - different things 0 3 1 3   Trouble relaxing 0 3 1 3   Restless 0 3 1 3   Easily annoyed or irritable 0 3 1 3   Afraid - awful might happen 0 3 0 3  Total GAD 7 Score 0 21 6 21   Anxiety Difficulty Not difficult at all Somewhat difficult       The 10-year ASCVD risk score (Arnett DK, et al., 2019) is: 8.2%   Values used to calculate the score:     Age: 47 years     Sex: Female     Is Non-Hispanic African American: Yes     Diabetic:  No     Tobacco smoker: Yes     Systolic Blood Pressure: 135 mmHg     Is BP treated: No     HDL Cholesterol: 57 mg/dL     Total Cholesterol: 252 mg/dL      Assessment & Plan:    Patient Active Problem List   Diagnosis Date Noted   At risk for cardiovascular event 03/04/2024   Osteomyelitis (HCC)  Cellulitis 02/18/2024   Chronic health problem 02/14/2024   Cellulitis 02/14/2024   Alcohol use 02/14/2024   Major depressive disorder, recurrent episode, moderate (HCC) 01/18/2024   GAD (generalized anxiety disorder) 01/18/2024   GERD without esophagitis 10/16/2020   Cervical dysplasia 09/28/2020   Low grade squamous intraepithelial lesion on cytologic smear of cervix (LGSIL) 04/02/2020   Right foot pain 02/15/2020   Early syphilis, 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 Turnbaugh has been doing well with doxycycline  and cefadroxil  and appears to be healing adequately and has no obvious signs of infection on exam as she has completed about 2 out of 4 weeks of treatment. Continue basic wound care with soap and water. Recommend follow up with Dr. Hulda Mage to ensure no surgical intervention is needed. Will continue current dose of doxycycline  and cefadroxil  for remaining 2 weeks and will  follow up in 1 month or sooner if needed.       Relevant Medications   bictegravir-emtricitabine -tenofovir  AF (BIKTARVY ) 50-200-25 MG TABS tablet     Other   Tobacco dependence   Ms. Tillett continues to smoke tobacco which is likely to increase risk for complicated healing of her right foot and increase risk of further infection. Counseled on the dangers of tobacco not ready to quit at this time.  Reviewed strategies to  maximize success, including removing cigarettes and smoking materials from environment, stress management, substitution of other forms of reinforcement, support of family/friends, and written materials.        HIV disease (HCC) - Primary   Ms. Ventrone continues to have well controlled virus with good adherence and tolerance to Biktarvy . Reviewed lab work and discussed plan of care and U equals U. Social determinants of health reviewed and interventions indicated to assist with food. No lab work today. Continue current dose of Biktarvy . Plan for follow up in 1 month or sooner if needed.       Relevant Medications   bictegravir-emtricitabine -tenofovir  AF (BIKTARVY ) 50-200-25 MG TABS tablet   Healthcare maintenance   Discussed importance of safe sexual practice and condom use. Condoms and site specific STD testing offered.  Vaccinations reviewed and declines Covid vaccination.  Due for dental care with appointment information provided in AVS. Due for mammogram with order placed. Cervical cancer screening is up to date per recommendations.       At risk for cardiovascular event   Ms. Schwaiger has a 10 year ASCVD risk score of 8.2%. May benefit from statin medication to help reduce cardiovascular disease and per the REPREIVE study may help with HIV associated inflammation.       Other Visit Diagnoses       At risk for breast cancer       Relevant Orders   MM Digital Screening        I am having Shaquoia L. Slee maintain her acetaminophen , Ensure, sertraline , folic acid , albuterol , oxyCODONE , ondansetron , cefadroxil , doxycycline , omeprazole , fenofibrate , and Biktarvy .   Meds ordered this encounter  Medications   bictegravir-emtricitabine -tenofovir  AF (BIKTARVY ) 50-200-25 MG TABS tablet    Sig: Take 1 tablet by mouth daily    Dispense:  30 tablet    Refill:  5    Supervising Provider:   Liane Redman [phone]    Prescription Type::   Renewal     Follow-up: Return in about 1 month  (around 04/04/2024). or sooner if needed.    Marlan Silva, MSN, FNP-C Nurse Practitioner [facility] Main number: [phone]

## 2024-03-04 NOTE — Patient Instructions (Addendum)
 Nice to see you.  Continue to take your medication daily as prescribed.  Refills have been sent to the pharmacy.  Continue with basic wound care - soap and water  Optimize protein intake.   Please call [facility]) to schedule/follow up on your dental care at [phone]  Plan for follow up in 1 months or sooner if needed with lab work on the same day.  Have a great day and stay safe!  Follow up from Hospital:  [facility] Dr. Hulda Mage [address], [place], [place] [address] Phone: [phone]  Smoking Cessation: QuitlineNC 1-800-QUIT-NOW 520-188-9924); Espaol: 1-855-Djelo-Ya (1-386-371-6457) [email or web address]

## 2024-03-04 NOTE — Assessment & Plan Note (Signed)
 Michelle Waters has been doing well with doxycycline  and cefadroxil  and appears to be healing adequately and has no obvious signs of infection on exam as she has completed about 2 out of 4 weeks of treatment. Continue basic wound care with soap and water. Recommend follow up with Dr. Hulda Mage to ensure no surgical intervention is needed. Will continue current dose of doxycycline  and cefadroxil  for remaining 2 weeks and will follow up in 1 month or sooner if needed.

## 2024-03-04 NOTE — Assessment & Plan Note (Signed)
 Michelle Waters continues to smoke tobacco which is likely to increase risk for complicated healing of her right foot and increase risk of further infection. Counseled on the dangers of tobacco not ready to quit at this time.  Reviewed strategies to maximize success, including removing cigarettes and smoking materials from environment, stress management, substitution of other forms of reinforcement, support of family/friends, and written materials.

## 2024-03-04 NOTE — Assessment & Plan Note (Signed)
 Michelle Waters has a 10 year ASCVD risk score of 8.2%. May benefit from statin medication to help reduce cardiovascular disease and per the REPREIVE study may help with HIV associated inflammation.

## 2024-03-04 NOTE — Assessment & Plan Note (Signed)
 Michelle Waters continues to have well controlled virus with good adherence and tolerance to Biktarvy . Reviewed lab work and discussed plan of care and U equals U. Social determinants of health reviewed and interventions indicated to assist with food. No lab work today. Continue current dose of Biktarvy . Plan for follow up in 1 month or sooner if needed.

## 2024-03-04 NOTE — Assessment & Plan Note (Signed)
 Discussed importance of safe sexual practice and condom use. Condoms and site specific STD testing offered.  Vaccinations reviewed and declines Covid vaccination.  Due for dental care with appointment information provided in AVS. Due for mammogram with order placed. Cervical cancer screening is up to date per recommendations.

## 2024-03-17 ENCOUNTER — Telehealth (HOSPITAL_COMMUNITY): Admitting: Student

## 2024-03-17 ENCOUNTER — Encounter (HOSPITAL_COMMUNITY): Payer: Self-pay

## 2024-03-17 ENCOUNTER — Other Ambulatory Visit (HOSPITAL_COMMUNITY): Payer: Self-pay | Admitting: Student

## 2024-03-17 DIAGNOSIS — Z599 Problem related to housing and economic circumstances, unspecified: Secondary | ICD-10-CM

## 2024-03-21 ENCOUNTER — Other Ambulatory Visit: Payer: Self-pay

## 2024-03-21 DIAGNOSIS — B20 Human immunodeficiency virus [HIV] disease: Secondary | ICD-10-CM

## 2024-03-21 MED ORDER — BIKTARVY 50-200-25 MG PO TABS: ORAL_TABLET | ORAL | 5 refills | Status: AC

## 2024-03-23 ENCOUNTER — Telehealth: Payer: Self-pay | Admitting: *Deleted

## 2024-03-23 NOTE — Progress Notes (Signed)
 Complex Care Management Note  Care Guide Note 03/23/2024 Name: [name] MRN: [id number] DOB: [birth year]  [name] is a 53 y.o. year old female who sees Lawrance Presume, MD for primary care. I reached out to Eleny L Plato by phone today to offer complex care management services.  [name] was given information about Complex Care Management services today including:   The Complex Care Management services include support from the care team which includes your Nurse Care Manager, Clinical Social Worker, or Pharmacist.  The Complex Care Management team is here to help remove barriers to the health concerns and goals most important to you. Complex Care Management services are voluntary, and the patient may decline or stop services at any time by request to their care team member.   Complex Care Management Consent Status: Patient agreed to services and verbal consent obtained.   Follow up plan:  Telephone appointment with complex care management team member scheduled for:  03/28/24  Encounter Outcome:  Patient Scheduled  [name]  [facility]  [facility], [facility] Guide  Direct Dial: [phone]  Fax [phone]

## 2024-03-24 ENCOUNTER — Other Ambulatory Visit: Payer: Self-pay | Admitting: Family

## 2024-03-24 DIAGNOSIS — N6489 Other specified disorders of breast: Secondary | ICD-10-CM

## 2024-03-28 ENCOUNTER — Encounter: Admitting: Obstetrics and Gynecology

## 2024-03-28 ENCOUNTER — Telehealth: Payer: Self-pay

## 2024-03-29 ENCOUNTER — Ambulatory Visit: Admitting: Family

## 2024-04-01 ENCOUNTER — Ambulatory Visit: Admitting: Family

## 2024-04-04 ENCOUNTER — Other Ambulatory Visit: Payer: Self-pay

## 2024-04-04 ENCOUNTER — Ambulatory Visit (INDEPENDENT_AMBULATORY_CARE_PROVIDER_SITE_OTHER): Admitting: Family

## 2024-04-04 ENCOUNTER — Encounter: Payer: Self-pay | Admitting: Family

## 2024-04-04 VITALS — BP 150/9 | HR 69 | Temp 98.1°F | Wt 120.8 lb

## 2024-04-04 DIAGNOSIS — Z9189 Other specified personal risk factors, not elsewhere classified: Secondary | ICD-10-CM | POA: Diagnosis not present

## 2024-04-04 DIAGNOSIS — Z79899 Other long term (current) drug therapy: Secondary | ICD-10-CM

## 2024-04-04 DIAGNOSIS — F17200 Nicotine dependence, unspecified, uncomplicated: Secondary | ICD-10-CM | POA: Diagnosis not present

## 2024-04-04 DIAGNOSIS — L03031 Cellulitis of right toe: Secondary | ICD-10-CM | POA: Diagnosis not present

## 2024-04-04 DIAGNOSIS — M86271 Subacute osteomyelitis, right ankle and foot: Secondary | ICD-10-CM

## 2024-04-04 DIAGNOSIS — B20 Human immunodeficiency virus [HIV] disease: Secondary | ICD-10-CM | POA: Diagnosis present

## 2024-04-04 DIAGNOSIS — Z0000 Encounter for general adult medical examination without abnormal findings: Secondary | ICD-10-CM

## 2024-04-04 DIAGNOSIS — M86171 Other acute osteomyelitis, right ankle and foot: Secondary | ICD-10-CM

## 2024-04-04 NOTE — Progress Notes (Signed)
 Brief Narrative   Patient ID: Michelle Waters, female    DOB: 1971/09/05, 53 y.o.   MRN: [id number]  Ms. Kneece is a 53 year old African-American female diagnosed with HIV in November 2018 with risk factor heterosexual contact. CD4 nadir of 1080 and viral load of 472.  Entered care at [facility] Stage 2. Genotype with K103R/V179D with resistance to Efavirinez and Nevirapine. No history of opportunistic infection. ART experienced with Biktarvy    Subjective:   Chief Complaint  Patient presents with   Follow-up    B20, osteomyelitis    HPI:  Michelle Waters is a 53 y.o. female with HIV disease last seen on 03/04/2024 for hospital follow-up with osteomyelitis of the right fifth phalanx and had well-controlled HIV disease with viral load that was undetectable and CD4 count 1344.  Plan was to continue with 2 additional weeks of cefadroxil  and doxycycline  and follow-up with Dr. Hulda Mage.  Here today for follow-up.  Ms. Trefry is doing well since her last office visit and completed the doxycycline  and cefadroxil  as prescribed and continues to take Biktarvy  with no adverse side effects.  Foot is significantly improved and able to wear shoe with no additional problems.  Has several follow-up appointments including mammogram and dental visit.  Healthcare maintenance reviewed.  Condoms and site-specific STD testing offered.  Housing, transportation, access to food are stable.  Denies fevers, chills, night sweats, headaches, changes in vision, neck pain/stiffness, nausea, diarrhea, vomiting, lesions or rashes.  Lab Results  Component Value Date   CD4TCELL 56 02/14/2024   CD4TABS 1,344 02/14/2024   Lab Results  Component Value Date   HIV1RNAQUANT <20 02/14/2024     Allergies  Allergen Reactions   Ampicillin Swelling and Rash   Penicillins Swelling and Rash   Shrimp [Shellfish Allergy] Swelling   Codeine Itching   Hydrocodone  Itching      Outpatient Medications Prior to Visit  Medication Sig  Dispense Refill   acetaminophen  (TYLENOL ) 500 MG tablet Take 1,000 mg by mouth every 6 (six) hours as needed for moderate pain.      albuterol  (VENTOLIN  HFA) 108 (90 Base) MCG/ACT inhaler Inhale 2 puffs into the lungs every 6 (six) hours as needed for wheezing or shortness of breath. 54 g 3   bictegravir-emtricitabine -tenofovir  AF (BIKTARVY ) 50-200-25 MG TABS tablet Take 1 tablet by mouth daily 30 tablet 5   Ensure (ENSURE) Take 237 mLs by mouth 2 (two) times daily between meals. 237 mL 6   fenofibrate  (TRICOR ) 48 MG tablet TAKE 1 TABLET(48 MG) BY MOUTH DAILY 90 tablet 2   folic acid  (FOLVITE ) 1 MG tablet Take 1 tablet (1 mg total) by mouth daily.     omeprazole  (PRILOSEC) 20 MG capsule TAKE 1 CAPSULE(20 MG) BY MOUTH DAILY. FOLLOW UP FOR FURTHER REFILLS. THANK YOU 90 capsule 1   ondansetron  (ZOFRAN -ODT) 4 MG disintegrating tablet Take 1 tablet (4 mg total) by mouth every 8 (eight) hours as needed for nausea or vomiting. 20 tablet 0   oxyCODONE  (OXY IR/ROXICODONE ) 5 MG immediate release tablet Take 1 tablet (5 mg total) by mouth every 4 (four) hours as needed for moderate pain (pain score 4-6). 15 tablet 0   sertraline  (ZOLOFT ) 50 MG tablet Take 50 mg by mouth daily.     No facility-administered medications prior to visit.     Past Medical History:  Diagnosis Date   Acid reflux    Elevated serum creatinine 10/05/2017   HIV (human immunodeficiency virus infection) (HCC)  Hypertension      Past Surgical History:  Procedure Laterality Date   COLPOSCOPY  09/20/2020       I & D EXTREMITY Right 08/30/2017   Procedure: IRRIGATION AND DEBRIDEMENT INDEX FINGER;  Surgeon: Winston Hawking, MD;  Location: [facility] OR;  Service: Orthopedics;  Laterality: Right;        Review of Systems  Constitutional:  Negative for appetite change, chills, diaphoresis, fatigue, fever and unexpected weight change.  Eyes:        Negative for acute change in vision  Respiratory:  Negative for chest tightness,  shortness of breath and wheezing.   Cardiovascular:  Negative for chest pain.  Gastrointestinal:  Negative for diarrhea, nausea and vomiting.  Genitourinary:  Negative for dysuria, pelvic pain and vaginal discharge.  Musculoskeletal:  Negative for neck pain and neck stiffness.  Skin:  Negative for rash.  Neurological:  Negative for seizures, syncope, weakness and headaches.  Hematological:  Negative for adenopathy. Does not bruise/bleed easily.  Psychiatric/Behavioral:  Negative for hallucinations.      Objective:   BP (!) 150/9   Pulse 69   Temp 98.1 F (36.7 C) (Oral)   Wt 120 lb 12.8 oz (54.8 kg)   SpO2 99%   BMI 25.25 kg/m  Nursing note and vital signs reviewed.  Physical Exam Constitutional:      General: She is not in acute distress.    Appearance: She is well-developed.   Eyes:     Conjunctiva/sclera: Conjunctivae normal.    Cardiovascular:     Rate and Rhythm: Normal rate and regular rhythm.     Heart sounds: Normal heart sounds. No murmur heard.    No friction rub. No gallop.  Pulmonary:     Effort: Pulmonary effort is normal. No respiratory distress.     Breath sounds: Normal breath sounds. No wheezing or rales.  Chest:     Chest wall: No tenderness.  Abdominal:     General: Bowel sounds are normal.     Palpations: Abdomen is soft.     Tenderness: There is no abdominal tenderness.   Musculoskeletal:     Cervical back: Neck supple.  Lymphadenopathy:     Cervical: No cervical adenopathy.   Skin:    General: Skin is warm and dry.     Findings: No rash.   Neurological:     Mental Status: She is alert and oriented to person, place, and time.   Psychiatric:        Behavior: Behavior normal.        Thought Content: Thought content normal.        Judgment: Judgment normal.          04/04/2024    8:52 AM 03/04/2024   10:52 AM 01/15/2024   11:31 AM 12/17/2023    9:44 AM 05/19/2023    1:41 PM  Depression screen PHQ 2/9  Decreased Interest 1 1  2 1    Down, Depressed, Hopeless 0 0  2 1  PHQ - 2 Score 1 1  4 2   Altered sleeping 0 0  2 1  Tired, decreased energy 0 0  2 1  Change in appetite 0 0  2 0  Feeling bad or failure about yourself  0 0  2 1  Trouble concentrating 0    2  Moving slowly or fidgety/restless 0 0  2 0  Suicidal thoughts 0 0  2 0  PHQ-9 Score 1 1  16 7   Difficult doing work/chores  Not difficult at all Not difficult at all  Somewhat difficult      Information is confidential and restricted. Go to Review Flowsheets to unlock data.        04/04/2024    8:51 AM 03/04/2024   10:52 AM 12/17/2023    9:45 AM 05/19/2023    1:41 PM  GAD 7 : Generalized Anxiety Score  Nervous, Anxious, on Edge 0 0 3 1  Control/stop worrying 0 0 3 1  Worry too much - different things 0 0 3 1  Trouble relaxing 0 0 3 1  Restless 0 0 3 1  Easily annoyed or irritable 0 0 3 1  Afraid - awful might happen 0 0 3 0  Total GAD 7 Score 0 0 21 6  Anxiety Difficulty Not difficult at all Not difficult at all Somewhat difficult      The 10-year ASCVD risk score (Arnett DK, et al., 2019) is: 12.7%   Values used to calculate the score:     Age: 73 years     Clincally relevant sex: Female     Is Non-Hispanic African American: Yes     Diabetic: No     Tobacco smoker: Yes     Systolic Blood Pressure: 150 mmHg     Is BP treated: No     HDL Cholesterol: 57 mg/dL     Total Cholesterol: 252 mg/dL      Assessment & Plan:    Patient Active Problem List   Diagnosis Date Noted   At risk for cardiovascular event 03/04/2024   Osteomyelitis (HCC)  Cellulitis 02/18/2024   Chronic health problem 02/14/2024   Cellulitis 02/14/2024   Alcohol use 02/14/2024   Major depressive disorder, recurrent episode, moderate (HCC) 01/18/2024   GAD (generalized anxiety disorder) 01/18/2024   GERD without esophagitis 10/16/2020   Cervical dysplasia 09/28/2020   Low grade squamous intraepithelial lesion on cytologic smear of cervix (LGSIL) 04/02/2020   Right foot  pain 02/15/2020   Early syphilis, 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 Ramires has completed treatment for cellulitis/osteomyelitis of the right fifth phalanx with no adverse side effects and no residual symptoms.  No additional treatment necessary at this time.        Other   Tobacco dependence   Continues to smoke approximately 1/2 pack cigarettes per day. Counseled on the dangers of tobacco not ready to quit at this time.  Reviewed strategies to maximize success, including removing cigarettes and smoking materials from environment, stress management, substitution of other forms of reinforcement, support of family/friends, and written materials.        HIV disease (HCC) - Primary   Ms. Niccoli continues to have well-controlled virus with good adherence and tolerance to Biktarvy .  Reviewed previous lab work and discussed plan of care and U equals U.  Social determinants of health reviewed with no interventions indicated.  Covered by Medicaid and no problems obtaining medication from the pharmacy.  Check lab work.  Continue current dose of Biktarvy .  Plan for follow-up in 6 months or sooner if needed with lab work on the same day.      Relevant Orders   Comprehensive metabolic panel  with GFR   HIV-1 RNA quant-no reflex-bld   T-helper cells (CD4) count (not at [facility])   Healthcare maintenance   Discussed importance of safe sexual practice and condom use. Condoms and site specific STD testing offered.  Vaccinations reviewed and up to date.  Mammogram scheduled. Dental appointment scheduled.  Cervical cancer screening up to date.       At risk for cardiovascular event   Discussed continued cardiovascular  disease risk 10-year ASCVD risk score 12.1%.  Reeducated regarding the use of statin medication to help reduce the risk of cardiovascular disease and inflammation associated with HIV.  Consider medication at next office visit.      Other Visit Diagnoses       Pharmacologic therapy       Relevant Orders   Lipid panel     At increased risk for cardiovascular disease            I am having Joeli L. Minasyan maintain her acetaminophen , Ensure, sertraline , folic acid , albuterol , oxyCODONE , ondansetron , omeprazole , fenofibrate , and Biktarvy .   Follow-up: Return in about 6 months (around 10/04/2024). or sooner if needed.    Marlan Silva, MSN, FNP-C Nurse Practitioner [facility] Main number: [phone]

## 2024-04-04 NOTE — Assessment & Plan Note (Signed)
 Michelle Waters has completed treatment for cellulitis/osteomyelitis of the right fifth phalanx with no adverse side effects and no residual symptoms.  No additional treatment necessary at this time.

## 2024-04-04 NOTE — Assessment & Plan Note (Signed)
 Discussed importance of safe sexual practice and condom use. Condoms and site specific STD testing offered.  Vaccinations reviewed and up to date.  Mammogram scheduled. Dental appointment scheduled.  Cervical cancer screening up to date.

## 2024-04-04 NOTE — Assessment & Plan Note (Signed)
 Continues to smoke approximately 1/2 pack cigarettes per day. Counseled on the dangers of tobacco not ready to quit at this time.  Reviewed strategies to maximize success, including removing cigarettes and smoking materials from environment, stress management, substitution of other forms of reinforcement, support of family/friends, and written materials.

## 2024-04-04 NOTE — Assessment & Plan Note (Signed)
 Michelle Waters continues to have well-controlled virus with good adherence and tolerance to Biktarvy .  Reviewed previous lab work and discussed plan of care and U equals U.  Social determinants of health reviewed with no interventions indicated.  Covered by Medicaid and no problems obtaining medication from the pharmacy.  Check lab work.  Continue current dose of Biktarvy .  Plan for follow-up in 6 months or sooner if needed with lab work on the same day.

## 2024-04-04 NOTE — Patient Instructions (Addendum)
 Nice to see you.  We will check your lab work today.  Continue to take your medication daily as prescribed.  Refills have been sent to the pharmacy.  Plan for follow up in 6 months or sooner if needed with lab work on the same day.  Have a great day and stay safe!   Smoking Cessation: QuitlineNC 1-800-QUIT-NOW (775)012-8931); Espaol: 1-855-Djelo-Ya (1-(905)002-5895) http://carroll-castaneda.info/

## 2024-04-04 NOTE — Assessment & Plan Note (Signed)
 Discussed continued cardiovascular disease risk 10-year ASCVD risk score 12.1%.  Reeducated regarding the use of statin medication to help reduce the risk of cardiovascular disease and inflammation associated with HIV.  Consider medication at next office visit.

## 2024-04-05 ENCOUNTER — Other Ambulatory Visit: Payer: Self-pay | Admitting: Internal Medicine

## 2024-04-05 ENCOUNTER — Other Ambulatory Visit: Payer: Self-pay | Admitting: Family

## 2024-04-05 DIAGNOSIS — R928 Other abnormal and inconclusive findings on diagnostic imaging of breast: Secondary | ICD-10-CM

## 2024-04-05 DIAGNOSIS — N6489 Other specified disorders of breast: Secondary | ICD-10-CM

## 2024-04-06 ENCOUNTER — Ambulatory Visit

## 2024-04-06 ENCOUNTER — Ambulatory Visit: Payer: Self-pay | Admitting: Family

## 2024-04-06 LAB — T-HELPER CELLS (CD4) COUNT (NOT AT ARMC)
Absolute CD4: 980 {cells}/uL (ref 490–1740)
CD4 T Helper %: 55 % (ref 30–61)
Total lymphocyte count: 1787 {cells}/uL (ref 850–3900)

## 2024-04-06 LAB — COMPREHENSIVE METABOLIC PANEL WITH GFR
AG Ratio: 1.5 (calc) (ref 1.0–2.5)
ALT: 23 U/L (ref 6–29)
AST: 33 U/L (ref 10–35)
Albumin: 4.6 g/dL (ref 3.6–5.1)
Alkaline phosphatase (APISO): 88 U/L (ref 37–153)
BUN: 11 mg/dL (ref 7–25)
CO2: 26 mmol/L (ref 20–32)
Calcium: 9.9 mg/dL (ref 8.6–10.4)
Chloride: 104 mmol/L (ref 98–110)
Creat: 0.81 mg/dL (ref 0.50–1.03)
Globulin: 3.1 g/dL (ref 1.9–3.7)
Glucose, Bld: 84 mg/dL (ref 65–99)
Potassium: 4.7 mmol/L (ref 3.5–5.3)
Sodium: 139 mmol/L (ref 135–146)
Total Bilirubin: 0.4 mg/dL (ref 0.2–1.2)
Total Protein: 7.7 g/dL (ref 6.1–8.1)
eGFR: 86 mL/min/{1.73_m2} (ref >=60)

## 2024-04-06 LAB — HIV-1 RNA QUANT-NO REFLEX-BLD
HIV 1 RNA Quant: NOT DETECTED {copies}/mL
HIV-1 RNA Quant, Log: NOT DETECTED {Log_copies}/mL

## 2024-04-06 LAB — LIPID PANEL
Cholesterol: 249 mg/dL — ABNORMAL HIGH (ref <200)
HDL: 91 mg/dL (ref >=50)
LDL Cholesterol (Calc): 133 mg/dL — ABNORMAL HIGH
Non-HDL Cholesterol (Calc): 158 mg/dL — ABNORMAL HIGH (ref <130)
Total CHOL/HDL Ratio: 2.7 (calc) (ref <5.0)
Triglycerides: 130 mg/dL (ref <150)

## 2024-04-07 ENCOUNTER — Other Ambulatory Visit

## 2024-04-07 ENCOUNTER — Encounter

## 2024-04-11 ENCOUNTER — Other Ambulatory Visit: Payer: Self-pay

## 2024-04-12 ENCOUNTER — Telehealth: Payer: Self-pay | Admitting: *Deleted

## 2024-04-12 NOTE — Progress Notes (Unsigned)
 Complex Care Management Care Guide Note  04/12/2024 Name: Michelle Waters MRN: [id number] DOB: 08-Jul-1971  Michelle Waters is a 53 y.o. year old female who is a primary care patient of Vicci Barnie NOVAK, MD and is actively engaged with the care management team. I reached out to [employer] by phone today to assist with re-scheduling  with the BSW.  Follow up plan: Unsuccessful telephone outreach attempt made. A HIPAA compliant phone message was left for the patient providing contact information and requesting a return call.  Harlene Satterfield  [facility]  Value-Based Care Institute, [facility] Guide  Direct Dial: [phone]  Fax [phone]

## 2024-04-13 NOTE — Progress Notes (Unsigned)
 Complex Care Management Care Guide Note  04/13/2024 Name: Michelle Waters MRN: [id number] DOB: 1971/03/05  Michelle Waters is a 53 y.o. year old female who is a primary care patient of Vicci Barnie NOVAK, MD and is actively engaged with the care management team. I reached out to [employer] by phone today to assist with re-scheduling  with the BSW.  Follow up plan: Unsuccessful telephone outreach attempt made. A HIPAA compliant phone message was left for the patient providing contact information and requesting a return call.   Harlene Satterfield  [facility]  Value-Based Care Institute, [facility] Guide  Direct Dial: [phone]  Fax [phone]

## 2024-04-14 NOTE — Progress Notes (Signed)
 Complex Care Management Care Guide Note  04/14/2024 Name: Michelle Waters MRN: [id number] DOB: [birth year]  Michelle Waters is a 53 y.o. year old female who is a primary care patient of Vicci Barnie NOVAK, MD and is actively engaged with the care management team. I reached out to [employer] by phone today to assist with re-scheduling  with the BSW.  Follow up plan: Unsuccessful telephone outreach attempt made. A HIPAA compliant phone message was left for the patient providing contact information and requesting a return call. No further outreach attempts will be made at this time. We have been unable to contact the patient to reschedule for complex care management services. Harlene Satterfield  [facility]  Value-Based Care Institute, [facility] Guide  Direct Dial: [phone]  Fax [phone]

## 2024-04-15 ENCOUNTER — Other Ambulatory Visit: Payer: Self-pay | Admitting: Family

## 2024-04-19 ENCOUNTER — Encounter: Admitting: Advanced Practice Midwife

## 2024-04-20 ENCOUNTER — Other Ambulatory Visit: Payer: Self-pay | Admitting: Family

## 2024-04-20 ENCOUNTER — Telehealth: Payer: Self-pay

## 2024-04-20 NOTE — Patient Outreach (Signed)
 Care Coordination   04/20/2024 Name: [name] MRN: [id number] DOB: 1971-07-12   Care Coordination Outreach Attempts:  An unsuccessful outreach was attempted for an appointment today.  Follow Up Plan:  Additional outreach attempts will be made to offer the patient complex care management information and services.   Encounter Outcome:  No Answer. HIPAA compliant voicemail left.   Rosaline Finlay, RN MSN [place]  [facility] Population Health RN Care Manager Direct Dial: [phone]  Fax: [phone]

## 2024-04-23 ENCOUNTER — Other Ambulatory Visit: Payer: Self-pay | Admitting: Family

## 2024-04-25 ENCOUNTER — Other Ambulatory Visit: Payer: Self-pay | Admitting: Family

## 2024-04-26 ENCOUNTER — Ambulatory Visit
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
## 2024-04-29 ENCOUNTER — Encounter (HOSPITAL_COMMUNITY): Admitting: Student in an Organized Health Care Education/Training Program

## 2024-05-05 ENCOUNTER — Telehealth: Payer: Self-pay

## 2024-05-05 NOTE — Telephone Encounter (Signed)
 Michelle Waters called to update her chart with pneumo 20 and RSV vaccines. Chart updated per [facility] records.   She also wants Cathlyn to know that she spoke with research team about a new study and she'd like to get his input before making a decision.   Ottilie Wigglesworth, BSN, RN

## 2024-05-06 NOTE — Telephone Encounter (Signed)
 Spoke with Ms. Verga regarding research options and feel she would be a good candidate for multiple studies that she will discuss with the Research Team.   Greg Makilah Dowda, NP 05/06/2024 9:43 AM

## 2024-05-17 NOTE — Progress Notes (Signed)
 Patient has had 3 or more unsuccessful outreaches. No further outreach attempts will be made at this time. We have been unable to contact the patient to reschedule for complex care management services.  Harlene Satterfield  [facility]  Value-Based Care Institute, [facility] Guide  Direct Dial: [phone]  Fax [phone]

## 2024-05-30 ENCOUNTER — Encounter

## 2024-05-30 ENCOUNTER — Other Ambulatory Visit: Payer: Self-pay

## 2024-05-30 VITALS — BP 137/84 | HR 65 | Temp 98.1°F | Resp 16 | Ht 61.02 in | Wt 123.0 lb

## 2024-05-30 DIAGNOSIS — Z006 Encounter for examination for normal comparison and control in clinical research program: Secondary | ICD-10-CM

## 2024-05-30 NOTE — Research (Signed)
 Individual seen for screening research visit for [facility], [phone], A study to investigate the virologic efficacy and safety of [id number] + Cabotegravir compared to [facility] in female and female adults living with HIV.  Inform Consent explained/reviewed, risk, benefits, responsibilities and other options were reviewed. Questions answered, comprehension of the study was assessed and adequate time to consider options was provided. Individual verbalized understanding and signed the informed consent witnessed by study coordinator. Copy provided to participant. Signed consent was obtained prior to any study procedures being completed.  Plan to see individual again in September 2025 for Entry/Day 1 Visit, pending eligibility.

## 2024-06-01 ENCOUNTER — Telehealth: Payer: Self-pay

## 2024-06-01 ENCOUNTER — Other Ambulatory Visit: Payer: Self-pay | Admitting: Internal Medicine

## 2024-06-01 NOTE — Telephone Encounter (Signed)
 Copied from CRM [id number]. Topic: Clinical - Medication Refill >> Jun 01, 2024  2:40 PM Jayma L wrote: Medication: omeprazole  (PRILOSEC) 20 MG capsule sertraline  (ZOLOFT ) 50 MG tablet  Has the patient contacted their pharmacy? Yes (Agent: If no, request that the patient contact the pharmacy for the refill. If patient does not wish to contact the pharmacy document the reason why and proceed with request.) (Agent: If yes, when and what did the pharmacy advise?)  This is the patient's preferred pharmacy:  [facility] STORE #[id number] [place] - [address] AT [facility] [address] [place] [address] Phone: [phone] Fax: [phone]    Is this the correct pharmacy for this prescription? Yes If no, delete pharmacy and type the correct one.   Has the prescription been filled recently? No  Is the patient out of the medication? Yes  Has the patient been seen for an appointment in the last year OR does the patient have an upcoming appointment? Yes  Can we respond through MyChart? Yes  Agent: Please be advised that Rx refills may take up to 3 business days. We ask that you follow-up with your pharmacy.

## 2024-06-01 NOTE — Telephone Encounter (Signed)
 Patient called to speak with PCP but also update NP regarding concerns from psychical. 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  Lorenda CHRISTELLA Code, RMA

## 2024-06-03 MED ORDER — SERTRALINE HCL 50 MG PO TABS: 50.0000 mg | ORAL_TABLET | Freq: Every day | ORAL | 0 refills | Status: AC

## 2024-06-03 NOTE — Telephone Encounter (Signed)
 Rx 02/23/24 #90 1RF- too soon Requested Prescriptions  Pending Prescriptions Disp Refills   sertraline  (ZOLOFT ) 50 MG tablet      Sig: Take 1 tablet (50 mg total) by mouth daily.     Psychiatry:  Antidepressants - SSRI - sertraline  Passed - 06/03/2024  3:48 PM      Passed - AST in normal range and within 360 days    AST  Date Value Ref Range Status  04/04/2024 33 10 - 35 U/L Final         Passed - ALT in normal range and within 360 days    ALT  Date Value Ref Range Status  04/04/2024 23 6 - 29 U/L Final         Passed - Completed PHQ-2 or PHQ-9 in the last 360 days      Passed - Valid encounter within last 6 months    Recent Outpatient Visits           5 months ago Major depressive disorder, recurrent episode, moderate (HCC)   [place] [facility] Dept Of [place]. [facility] Vicci Barnie NOVAK, MD   1 year ago Pap smear for cervical cancer screening   [place] [facility] - A Dept Of [place]. [facility] Vicci Barnie NOVAK, MD   1 year ago Acute hip pain, right   [place] [facility] - A Dept Of [place]. [facility] Vicci Barnie NOVAK, MD   2 years ago Mixed hyperlipidemia   [place] [facility] [place] - A Dept Of [place]. [facility] Vicci Barnie NOVAK, MD   3 years ago Need for Tdap vaccination   [place] [facility] [place] - A Dept Of [place]. [facility] [place], Garnette CROME, RPH-CPP              Refused Prescriptions Disp Refills   omeprazole  (PRILOSEC) 20 MG capsule 90 capsule 1     Gastroenterology: Proton Pump Inhibitors Passed - 06/03/2024  3:48 PM      Passed - Valid encounter within last 12 months    Recent Outpatient Visits           5 months ago Major depressive disorder, recurrent episode, moderate (HCC)   [place] [facility] - A Dept Of [place]. [facility] Vicci Barnie NOVAK, MD   1 year ago Pap smear for cervical cancer  screening   [place] [facility] - A Dept Of [place]. [facility] Vicci Barnie NOVAK, MD   1 year ago Acute hip pain, right   [place] [facility] - A Dept Of [place]. [facility] Vicci Barnie NOVAK, MD   2 years ago Mixed hyperlipidemia   [place] [facility] - A Dept Of [place]. [facility] Vicci Barnie NOVAK, MD   3 years ago Need for Tdap vaccination    [facility] [place] - A Dept Of [place]. [facility] Fleeta Tonia Garnette CROME, RPH-CPP

## 2024-06-03 NOTE — Telephone Encounter (Signed)
 Requested medication (s) are due for refill today - unsure  Requested medication (s) are on the active medication list -yes  Future visit scheduled -yes  Last refill: 02/15/24  Notes to clinic: listed as historical provider- sent for review   Requested Prescriptions  Pending Prescriptions Disp Refills   sertraline  (ZOLOFT ) 50 MG tablet      Sig: Take 1 tablet (50 mg total) by mouth daily.     Psychiatry:  Antidepressants - SSRI - sertraline  Passed - 06/03/2024  3:48 PM      Passed - AST in normal range and within 360 days    AST  Date Value Ref Range Status  04/04/2024 33 10 - 35 U/L Final         Passed - ALT in normal range and within 360 days    ALT  Date Value Ref Range Status  04/04/2024 23 6 - 29 U/L Final         Passed - Completed PHQ-2 or PHQ-9 in the last 360 days      Passed - Valid encounter within last 6 months    Recent Outpatient Visits           5 months ago Major depressive disorder, recurrent episode, moderate (HCC)   [place] [facility] - A Dept Of [place]. [facility] Vicci Barnie NOVAK, MD   1 year ago Pap smear for cervical cancer screening   [place] [facility] - A Dept Of [place]. [facility] Vicci Barnie NOVAK, MD   1 year ago Acute hip pain, right   [place] [facility] - A Dept Of [place]. [facility] Vicci Barnie NOVAK, MD   2 years ago Mixed hyperlipidemia   [place] [facility] [place] - A Dept Of [place]. [facility] Vicci Barnie NOVAK, MD   3 years ago Need for Tdap vaccination   [place] [facility] [place] - A Dept Of [place]. [facility] [place], Garnette CROME, RPH-CPP              Refused Prescriptions Disp Refills   omeprazole  (PRILOSEC) 20 MG capsule 90 capsule 1     Gastroenterology: Proton Pump Inhibitors Passed - 06/03/2024  3:48 PM      Passed - Valid encounter within last 12 months    Recent Outpatient Visits           5  months ago Major depressive disorder, recurrent episode, moderate (HCC)   [place] [facility] - A Dept Of [place]. [facility] Vicci Barnie NOVAK, MD   1 year ago Pap smear for cervical cancer screening   [place] [facility] - A Dept Of [place]. [facility] Vicci Barnie NOVAK, MD   1 year ago Acute hip pain, right   [place] [facility] - A Dept Of [place]. [facility] Vicci Barnie NOVAK, MD   2 years ago Mixed hyperlipidemia   [place] [facility] [place] - A Dept Of [place]. [facility] Vicci Barnie NOVAK, MD   3 years ago Need for Tdap vaccination   [place] [facility] [place] - A Dept Of [place]. [facility] Fleeta Tonia Garnette CROME, RPH-CPP                 Requested Prescriptions  Pending Prescriptions Disp Refills   sertraline  (ZOLOFT ) 50 MG  tablet      Sig: Take 1 tablet (50 mg total) by mouth daily.     Psychiatry:  Antidepressants - SSRI - sertraline  Passed - 06/03/2024  3:48 PM      Passed - AST in normal range and within 360 days    AST  Date Value Ref Range Status  04/04/2024 33 10 - 35 U/L Final         Passed - ALT in normal range and within 360 days    ALT  Date Value Ref Range Status  04/04/2024 23 6 - 29 U/L Final         Passed - Completed PHQ-2 or PHQ-9 in the last 360 days      Passed - Valid encounter within last 6 months    Recent Outpatient Visits           5 months ago Major depressive disorder, recurrent episode, moderate (HCC)   [place] [facility] - A Dept Of [place]. [facility] Vicci Barnie NOVAK, MD   1 year ago Pap smear for cervical cancer screening   [place] [facility] - A Dept Of [place]. [facility] Vicci Barnie NOVAK, MD   1 year ago Acute hip pain, right   [place] [facility] - A Dept Of [place]. [facility] Vicci Barnie NOVAK, MD   2 years ago Mixed hyperlipidemia    [place] [facility] [place] - A Dept Of [place]. [facility] Vicci Barnie NOVAK, MD   3 years ago Need for Tdap vaccination   [place] [facility] [place] - A Dept Of [place]. [facility] [place], Garnette CROME, RPH-CPP              Refused Prescriptions Disp Refills   omeprazole  (PRILOSEC) 20 MG capsule 90 capsule 1     Gastroenterology: Proton Pump Inhibitors Passed - 06/03/2024  3:48 PM      Passed - Valid encounter within last 12 months    Recent Outpatient Visits           5 months ago Major depressive disorder, recurrent episode, moderate (HCC)   [place] [facility] - A Dept Of [place]. [facility] Vicci Barnie NOVAK, MD   1 year ago Pap smear for cervical cancer screening   [place] [facility] - A Dept Of [place]. [facility] Vicci Barnie NOVAK, MD   1 year ago Acute hip pain, right   [place] [facility] - A Dept Of [place]. [facility] Vicci Barnie NOVAK, MD   2 years ago Mixed hyperlipidemia    [facility] [place] - A Dept Of [place]. [facility] Vicci Barnie NOVAK, MD   3 years ago Need for Tdap vaccination    [facility] [place] - A Dept Of [place]. [facility] Fleeta Tonia Garnette CROME, RPH-CPP

## 2024-06-14 ENCOUNTER — Telehealth: Payer: Self-pay | Admitting: Internal Medicine

## 2024-06-14 NOTE — Telephone Encounter (Signed)
 Called patient to confirm upcoming appointment 06/16/2024. Patient appointment has been successfully confirmed

## 2024-06-16 ENCOUNTER — Ambulatory Visit: Payer: Medicaid Other | Admitting: Internal Medicine

## 2024-06-16 NOTE — Telephone Encounter (Signed)
 Copied from CRM [id number]. Topic: Appointments - Appointment Info/Confirmation >> Jun 16, 2024  7:38 AM Emylou G wrote:  Pls cancel Rondyln [place] transportation.. she has the flu and rescheduled appt

## 2024-06-16 NOTE — Telephone Encounter (Signed)
 Noted, Transportation voucher cancelled for patient.

## 2024-07-25 ENCOUNTER — Telehealth: Payer: Self-pay | Admitting: Internal Medicine

## 2024-07-25 NOTE — Telephone Encounter (Signed)
 Transportation Voucher   Pick up date and time: 07/29/2024 1:00 pm. Pick up address: [address] [place] [address]  Drop off address: [address] [employer] [address], [address]

## 2024-07-25 NOTE — Telephone Encounter (Signed)
 Copied from CRM [id number]. Topic: General - Transportation >> Jul 25, 2024  2:20 PM Berwyn MATSU wrote:  Reason for CRM: patient called in stating that she would need transportation.   May you please assist.

## 2024-07-27 ENCOUNTER — Telehealth: Payer: Self-pay | Admitting: Internal Medicine

## 2024-07-27 NOTE — Telephone Encounter (Signed)
 Patient confirmed appointment for 07/29/2024 through volunteer call.

## 2024-07-28 NOTE — Telephone Encounter (Signed)
 Noted, Cancelled.

## 2024-07-28 NOTE — Telephone Encounter (Signed)
 Copied from CRM [id number]. Topic: General - Transportation >> Jul 28, 2024  1:06 PM Michelle Waters wrote:  Reason for CRM: please call and cancel transportation for tomorrow, pt had to cancel appt. Please advise.

## 2024-07-29 ENCOUNTER — Ambulatory Visit

## 2024-07-29 ENCOUNTER — Ambulatory Visit: Admitting: Internal Medicine

## 2024-07-29 DIAGNOSIS — Z23 Encounter for immunization: Secondary | ICD-10-CM

## 2024-08-25 ENCOUNTER — Ambulatory Visit: Admitting: Internal Medicine

## 2024-09-20 ENCOUNTER — Ambulatory Visit: Admitting: Family

## 2024-09-22 ENCOUNTER — Ambulatory Visit: Admitting: Family

## 2024-09-30 ENCOUNTER — Ambulatory Visit: Admitting: Internal Medicine

## 2024-10-08 ENCOUNTER — Other Ambulatory Visit: Payer: Self-pay | Admitting: Family

## 2024-10-08 DIAGNOSIS — B20 Human immunodeficiency virus [HIV] disease: Secondary | ICD-10-CM

## 2024-10-18 ENCOUNTER — Ambulatory Visit: Admitting: Family

## 2024-10-19 ENCOUNTER — Ambulatory Visit: Admitting: Family

## 2024-10-19 ENCOUNTER — Encounter: Payer: Self-pay | Admitting: Family

## 2024-10-19 ENCOUNTER — Other Ambulatory Visit: Payer: Self-pay

## 2024-10-19 VITALS — BP 122/83 | HR 80 | Temp 97.6°F | Ht 61.5 in | Wt 131.0 lb

## 2024-10-19 DIAGNOSIS — B20 Human immunodeficiency virus [HIV] disease: Secondary | ICD-10-CM | POA: Diagnosis not present

## 2024-10-19 DIAGNOSIS — F17200 Nicotine dependence, unspecified, uncomplicated: Secondary | ICD-10-CM

## 2024-10-19 DIAGNOSIS — F17210 Nicotine dependence, cigarettes, uncomplicated: Secondary | ICD-10-CM | POA: Diagnosis not present

## 2024-10-19 DIAGNOSIS — Z0000 Encounter for general adult medical examination without abnormal findings: Secondary | ICD-10-CM

## 2024-10-19 MED ORDER — BIKTARVY 50-200-25 MG PO TABS: 1.0000 | ORAL_TABLET | Freq: Every day | ORAL | 5 refills | Status: AC

## 2024-10-19 NOTE — Assessment & Plan Note (Signed)
 Discussed importance of safe sexual practice and condom use. Condoms and site specific STD testing offered.  Vaccinations reviewed and up-to-date Due for mammogram and declined referral Colon cancer screening up-to-date. Cervical cancer screening up-to-date.

## 2024-10-19 NOTE — Assessment & Plan Note (Signed)
 Michelle Waters continues to have well-controlled virus with good adherence and tolerance to Biktarvy .  Reviewed previous lab work and discussed plan of care and U equals U.  No problems obtaining medication from the pharmacy and covered by Medicaid.  Social determinants of health reviewed and food insecurity identified with food and gift card provided.  Check blood work.  Continue current dose of Biktarvy .  Plan for follow-up in 6 months or sooner if needed with lab work on the same day.

## 2024-10-19 NOTE — Assessment & Plan Note (Signed)
 Michelle Waters continues to smoke approximately 1/2 pack of cigarettes per day.  Not ready to quit at this time. Counseled on the dangers of tobacco not ready to quit at this time.  Reviewed strategies to maximize success, including removing cigarettes and smoking materials from environment, stress management, substitution of other forms of reinforcement, support of family/friends, and written materials.

## 2024-10-19 NOTE — Patient Instructions (Addendum)
 Nice to see you.  We will check your lab work today.  Continue to take your medication daily as prescribed.  Refills have been sent to the pharmacy.  Plan for follow up in 6 months or sooner if needed with lab work on the same day.  Have a great day and stay safe!   Smoking Cessation: QuitlineNC 1-800-QUIT-NOW (248)712-7172); Espaol: 1-855-Djelo-Ya (1-954-718-9200) http://carroll-castaneda.info/

## 2024-10-19 NOTE — Progress Notes (Signed)
 "   Brief Narrative   Patient ID: Michelle Waters, female    DOB: 11/07/1970, 53 y.o.   MRN: [id number]  Michelle Waters is a 53 year old African-American female diagnosed with HIV in November 2018 with risk factor heterosexual contact. CD4 nadir of 1080 and viral load of 472.  Entered care at [facility] Stage 2. Genotype with K103R/V179D with resistance to Efavirinez and Nevirapine. No history of opportunistic infection. ART experienced with Biktarvy    Subjective:   Chief Complaint  Patient presents with   Follow-up    B20    HPI:  Michelle Waters is a 53 y.o. female with HIV disease last seen on 04/04/2024 with well-controlled virus and good adherence and tolerance to Biktarvy .  Viral load was undetectable with CD4 count 980.  Kidney function, liver function, electrolytes within normal ranges.  She had also completed treatment for suspected osteomyelitis.  Here today for routine follow-up.  Michelle Waters has been doing well since her last office visit and continues to take Biktarvy  as prescribed with no adverse side effects or problems obtaining medication from the pharmacy.  Covered by Medicaid. No new concerns/complaints. Using [place] and Medicaid for transportation and has food insecurity at times. Housing is stable. Health care maintenance reviewed. Condoms and site specific testing offered.   Denies fevers, chills, night sweats, headaches, changes in vision, neck pain/stiffness, nausea, diarrhea, vomiting, lesions or rashes.  Lab Results  Component Value Date   CD4TCELL 55 04/04/2024   CD4TABS 1,344 02/14/2024   Lab Results  Component Value Date   HIV1RNAQUANT NOT DETECTED 04/04/2024     Allergies[1]    Outpatient Medications Prior to Visit  Medication Sig Dispense Refill   acetaminophen  (TYLENOL ) 500 MG tablet Take 1,000 mg by mouth every 6 (six) hours as needed for moderate pain.      albuterol  (VENTOLIN  HFA) 108 (90 Base) MCG/ACT inhaler Inhale 2 puffs into the lungs every 6 (six) hours  as needed for wheezing or shortness of breath. 54 g 3   Ensure (ENSURE) Take 237 mLs by mouth 2 (two) times daily between meals. 237 mL 6   fenofibrate  (TRICOR ) 48 MG tablet TAKE 1 TABLET(48 MG) BY MOUTH DAILY 90 tablet 2   omeprazole  (PRILOSEC) 20 MG capsule TAKE 1 CAPSULE(20 MG) BY MOUTH DAILY. FOLLOW UP FOR FURTHER REFILLS. THANK YOU 90 capsule 1   sertraline  (ZOLOFT ) 50 MG tablet Take 1 tablet (50 mg total) by mouth daily. 30 tablet 0   BIKTARVY  50-200-25 MG TABS tablet TAKE 1 TABLET BY MOUTH DAILY 30 tablet 0   folic acid  (FOLVITE ) 1 MG tablet Take 1 tablet (1 mg total) by mouth daily. (Patient not taking: Reported on 10/19/2024)     No facility-administered medications prior to visit.     Past Medical History:  Diagnosis Date   Acid reflux    Elevated serum creatinine 10/05/2017   HIV (human immunodeficiency virus infection) (HCC)    Hypertension      Past Surgical History:  Procedure Laterality Date   COLPOSCOPY  09/20/2020       I & D EXTREMITY Right 08/30/2017   Procedure: IRRIGATION AND DEBRIDEMENT INDEX FINGER;  Surgeon: Kay Kemps, MD;  Location: [facility] OR;  Service: Orthopedics;  Laterality: Right;        Review of Systems  Constitutional:  Negative for appetite change, chills, diaphoresis, fatigue, fever and unexpected weight change.  Eyes:        Negative for acute change in vision  Respiratory:  Negative for chest tightness, shortness of breath and wheezing.   Cardiovascular:  Negative for chest pain.  Gastrointestinal:  Negative for diarrhea, nausea and vomiting.  Genitourinary:  Negative for dysuria, pelvic pain and vaginal discharge.  Musculoskeletal:  Negative for neck pain and neck stiffness.  Skin:  Negative for rash.  Neurological:  Negative for seizures, syncope, weakness and headaches.  Hematological:  Negative for adenopathy. Does not bruise/bleed easily.  Psychiatric/Behavioral:  Negative for hallucinations.      Objective:   BP 122/83    Pulse 80   Temp 97.6 F (36.4 C) (Temporal)   Ht 5' 1.5 (1.562 m)   Wt 131 lb (59.4 kg)   SpO2 99%   BMI 24.35 kg/m  Nursing note and vital signs reviewed.  Physical Exam Constitutional:      General: She is not in acute distress.    Appearance: She is well-developed.  Eyes:     Conjunctiva/sclera: Conjunctivae normal.  Cardiovascular:     Rate and Rhythm: Normal rate and regular rhythm.     Heart sounds: Normal heart sounds. No murmur heard.    No friction rub. No gallop.  Pulmonary:     Effort: Pulmonary effort is normal. No respiratory distress.     Breath sounds: Normal breath sounds. No wheezing or rales.  Chest:     Chest wall: No tenderness.  Musculoskeletal:     Cervical back: Neck supple.  Lymphadenopathy:     Cervical: No cervical adenopathy.  Skin:    General: Skin is warm and dry.     Findings: No rash.  Neurological:     Mental Status: She is alert.          04/04/2024    8:52 AM 03/04/2024   10:52 AM 01/15/2024   11:31 AM 12/17/2023    9:44 AM 05/19/2023    1:41 PM  Depression screen PHQ 2/9  Decreased Interest 1 1  2 1   Down, Depressed, Hopeless 0 0  2 1  PHQ - 2 Score 1 1  4 2   Altered sleeping 0 0  2 1  Tired, decreased energy 0 0  2 1  Change in appetite 0 0  2 0  Feeling bad or failure about yourself  0 0  2 1  Trouble concentrating 0    2  Moving slowly or fidgety/restless 0 0  2 0  Suicidal thoughts 0 0  2 0  PHQ-9 Score 1  1   16  7    Difficult doing work/chores Not difficult at all Not difficult at all  Somewhat difficult      Information is confidential and restricted. Go to Review Flowsheets to unlock data.   Data saved with a previous flowsheet row definition        04/04/2024    8:51 AM 03/04/2024   10:52 AM 12/17/2023    9:45 AM 05/19/2023    1:41 PM  GAD 7 : Generalized Anxiety Score  Nervous, Anxious, on Edge 0 0 3 1  Control/stop worrying 0 0 3 1  Worry too much - different things 0 0 3 1  Trouble relaxing 0 0 3 1   Restless 0 0 3 1  Easily annoyed or irritable 0 0 3 1  Afraid - awful might happen 0 0 3 0  Total GAD 7 Score 0 0 21 6  Anxiety Difficulty Not difficult at all Not difficult at all Somewhat difficult      The 10-year ASCVD risk  score (Arnett DK, et al., 2019) is: 4.1%   Values used to calculate the score:     Age: 27 years     Clinically relevant sex: Female     Is Non-Hispanic African American: Yes     Diabetic: No     Tobacco smoker: Yes     Systolic Blood Pressure: 122 mmHg     Is BP treated: No     HDL Cholesterol: 91 mg/dL     Total Cholesterol: 249 mg/dL      Assessment & Plan:    Patient Active Problem List   Diagnosis Date Noted   At risk for cardiovascular event 03/04/2024   Osteomyelitis (HCC)  Cellulitis 02/18/2024   Chronic health problem 02/14/2024   Cellulitis 02/14/2024   Alcohol use 02/14/2024   Major depressive disorder, recurrent episode, moderate (HCC) 01/18/2024   GAD (generalized anxiety disorder) 01/18/2024   GERD without esophagitis 10/16/2020   Cervical dysplasia 09/28/2020   Low grade squamous intraepithelial lesion on cytologic smear of cervix (LGSIL) 04/02/2020   Right foot pain 02/15/2020   Early syphilis, 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  Not ready to quit at this time. Counseled on the dangers of tobacco not ready to quit at this time.  Reviewed strategies to maximize success, including removing cigarettes and smoking materials from environment, stress management, substitution of other forms of reinforcement, support of family/friends, and written materials.        HIV disease (HCC) - Primary   Michelle Waters continues  to have well-controlled virus with good adherence and tolerance to Biktarvy .  Reviewed previous lab work and discussed plan of care and U equals U.  No problems obtaining medication from the pharmacy and covered by Medicaid.  Social determinants of health reviewed and food insecurity identified with food and gift card provided.  Check blood work.  Continue current dose of Biktarvy .  Plan for follow-up in 6 months or sooner if needed with lab work on the same day.      Relevant Medications   bictegravir-emtricitabine -tenofovir  AF (BIKTARVY ) 50-200-25 MG TABS tablet   Other Relevant Orders   Comprehensive metabolic panel with GFR   HIV-1 RNA quant-no reflex-bld   T-helper cells (CD4) count (not at [facility])   Healthcare maintenance   Discussed importance of safe sexual practice and condom use. Condoms and site specific STD testing offered.  Vaccinations reviewed and up-to-date Due for mammogram and declined referral Colon cancer screening up-to-date. Cervical cancer screening up-to-date.        I have changed Michelle Waters's Biktarvy . I am also having her maintain her acetaminophen , Ensure, folic acid , albuterol , omeprazole , fenofibrate , and sertraline .   Meds ordered this encounter  Medications   bictegravir-emtricitabine -tenofovir  AF (BIKTARVY ) 50-200-25 MG TABS tablet    Sig: Take 1 tablet by mouth daily.    Dispense:  30 tablet    Refill:  5    Supervising Provider:   SNIDER, CYNTHIA [phone]    Prescription Type::   Renewal     Follow-up: Return in about 6 months (around 04/18/2025). or sooner if needed.    Greg Damacio Weisgerber, MSN, FNP-C Nurse Practitioner  [facility] Main number: [phone]      [1]  Allergies Allergen Reactions   Ampicillin Swelling and Rash   Lemon Extract [Flavoring Agent] Swelling    Fruit   Penicillins Swelling and Rash   Shrimp [Shellfish Allergy] Swelling   Codeine Itching   Hydrocodone   Itching   "

## 2024-10-22 LAB — COMPREHENSIVE METABOLIC PANEL WITH GFR
AG Ratio: 1.5 (calc) (ref 1.0–2.5)
ALT: 17 U/L (ref 6–29)
AST: 26 U/L (ref 10–35)
Albumin: 4.8 g/dL (ref 3.6–5.1)
Alkaline phosphatase (APISO): 112 U/L (ref 37–153)
BUN/Creatinine Ratio: 15 (calc) (ref 6–22)
BUN: 16 mg/dL (ref 7–25)
CO2: 26 mmol/L (ref 20–32)
Calcium: 10.1 mg/dL (ref 8.6–10.4)
Chloride: 107 mmol/L (ref 98–110)
Creat: 1.06 mg/dL — ABNORMAL HIGH (ref 0.50–1.03)
Globulin: 3.3 g/dL (ref 1.9–3.7)
Glucose, Bld: 88 mg/dL (ref 65–99)
Potassium: 4 mmol/L (ref 3.5–5.3)
Sodium: 140 mmol/L (ref 135–146)
Total Bilirubin: 0.3 mg/dL (ref 0.2–1.2)
Total Protein: 8.1 g/dL (ref 6.1–8.1)
eGFR: 62 mL/min/1.73m2

## 2024-10-22 LAB — HIV-1 RNA QUANT-NO REFLEX-BLD
HIV 1 RNA Quant: <20 {copies}/mL — AB
HIV-1 RNA Quant, Log: <1.3 {Log_copies}/mL — AB

## 2024-10-22 LAB — T-HELPER CELLS (CD4) COUNT (NOT AT ARMC)
Absolute CD4: 1729 {cells}/uL (ref 490–1740)
CD4 T Helper %: 60 % (ref 30–61)
Total lymphocyte count: 2874 {cells}/uL (ref 850–3900)

## 2024-10-24 ENCOUNTER — Ambulatory Visit: Payer: Self-pay | Admitting: Family

## 2024-10-26 ENCOUNTER — Emergency Department (HOSPITAL_COMMUNITY)
Admission: EM | Admit: 2024-10-26 | Discharge: 2024-10-26 | Disposition: A | Discharge status: Home / Self Care | Attending: Emergency Medicine | Admitting: Emergency Medicine

## 2024-10-26 ENCOUNTER — Emergency Department (HOSPITAL_COMMUNITY)

## 2024-10-26 ENCOUNTER — Other Ambulatory Visit: Payer: Self-pay

## 2024-10-26 DIAGNOSIS — S8992XA Unspecified injury of left lower leg, initial encounter: Secondary | ICD-10-CM | POA: Diagnosis present

## 2024-10-26 DIAGNOSIS — Y92410 Unspecified street and highway as the place of occurrence of the external cause: Secondary | ICD-10-CM | POA: Diagnosis not present

## 2024-10-26 DIAGNOSIS — S8012XA Contusion of left lower leg, initial encounter: Secondary | ICD-10-CM | POA: Insufficient documentation

## 2024-10-26 DIAGNOSIS — M546 Pain in thoracic spine: Secondary | ICD-10-CM | POA: Insufficient documentation

## 2024-10-26 DIAGNOSIS — M542 Cervicalgia: Secondary | ICD-10-CM | POA: Diagnosis not present

## 2024-10-26 MED ORDER — OXYCODONE-ACETAMINOPHEN 5-325 MG PO TABS
1.0000 | ORAL_TABLET | ORAL | Status: DC | PRN
  Administered 2024-10-26: 1 via ORAL
  Filled 2024-10-26 (×2): qty 1

## 2024-10-26 MED ORDER — CYCLOBENZAPRINE HCL 10 MG PO TABS: 10.0000 mg | ORAL_TABLET | Freq: Two times a day (BID) | ORAL | 0 refills | Status: AC | PRN

## 2024-10-26 NOTE — ED Notes (Signed)
[place] applied in triage
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
## 2024-11-02 ENCOUNTER — Encounter: Payer: Self-pay | Admitting: Family

## 2024-11-02 NOTE — Progress Notes (Signed)
 Erroneous encounter-disregard

## 2024-11-10 ENCOUNTER — Other Ambulatory Visit: Payer: Self-pay

## 2024-11-10 ENCOUNTER — Telehealth: Payer: Self-pay

## 2024-11-10 MED ORDER — ENSURE PO LIQD: 237.0000 mL | Freq: Two times a day (BID) | ORAL | 6 refills | Status: DC

## 2024-11-10 MED ORDER — ENSURE PO LIQD: 237.0000 mL | Freq: Two times a day (BID) | ORAL | 6 refills | Status: AC

## 2024-11-10 NOTE — Telephone Encounter (Signed)
 Noted! Thank you
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
## 2024-11-11 ENCOUNTER — Encounter: Payer: Self-pay | Admitting: Internal Medicine

## 2024-11-14 ENCOUNTER — Inpatient Hospital Stay: Admitting: Family

## 2024-12-14 ENCOUNTER — Ambulatory Visit: Admitting: Family

## 2025-04-03 ENCOUNTER — Ambulatory Visit: Payer: Self-pay | Admitting: Family
